# Patient Record
Sex: Male | Born: 1960
Health system: Southern US, Community
[De-identification: ages and names within clinical notes are randomized; demographics above are authoritative.]

## PROBLEM LIST (undated history)

## (undated) DIAGNOSIS — E039 Hypothyroidism, unspecified: Secondary | ICD-10-CM

## (undated) DIAGNOSIS — F319 Bipolar disorder, unspecified: Secondary | ICD-10-CM

## (undated) DIAGNOSIS — S0990XA Unspecified injury of head, initial encounter: Secondary | ICD-10-CM

## (undated) DIAGNOSIS — K219 Gastro-esophageal reflux disease without esophagitis: Secondary | ICD-10-CM

## (undated) DIAGNOSIS — M1990 Unspecified osteoarthritis, unspecified site: Secondary | ICD-10-CM

## (undated) DIAGNOSIS — F329 Major depressive disorder, single episode, unspecified: Secondary | ICD-10-CM

## (undated) DIAGNOSIS — E079 Disorder of thyroid, unspecified: Secondary | ICD-10-CM

## (undated) DIAGNOSIS — F32A Depression, unspecified: Secondary | ICD-10-CM

## (undated) DIAGNOSIS — Z8781 Personal history of (healed) traumatic fracture: Secondary | ICD-10-CM

## (undated) DIAGNOSIS — E119 Type 2 diabetes mellitus without complications: Secondary | ICD-10-CM

## (undated) DIAGNOSIS — Z9981 Dependence on supplemental oxygen: Secondary | ICD-10-CM

## (undated) DIAGNOSIS — F419 Anxiety disorder, unspecified: Secondary | ICD-10-CM

## (undated) DIAGNOSIS — Z9889 Other specified postprocedural states: Secondary | ICD-10-CM

## (undated) DIAGNOSIS — I8393 Asymptomatic varicose veins of bilateral lower extremities: Secondary | ICD-10-CM

## (undated) DIAGNOSIS — R112 Nausea with vomiting, unspecified: Secondary | ICD-10-CM

## (undated) DIAGNOSIS — J45909 Unspecified asthma, uncomplicated: Secondary | ICD-10-CM

## (undated) DIAGNOSIS — G629 Polyneuropathy, unspecified: Secondary | ICD-10-CM

## (undated) DIAGNOSIS — L0390 Cellulitis, unspecified: Secondary | ICD-10-CM

## (undated) DIAGNOSIS — I10 Essential (primary) hypertension: Secondary | ICD-10-CM

## (undated) DIAGNOSIS — J449 Chronic obstructive pulmonary disease, unspecified: Secondary | ICD-10-CM

## (undated) HISTORY — PX: SHOULDER SURGERY: SHX246

## (undated) HISTORY — PX: GANGLION CYST EXCISION: SHX1691

## (undated) HISTORY — DX: Bipolar disorder, unspecified: F31.9

## (undated) HISTORY — DX: Anxiety disorder, unspecified: F41.9

## (undated) HISTORY — PX: CHOLECYSTECTOMY: SHX55

## (undated) HISTORY — DX: Unspecified osteoarthritis, unspecified site: M19.90

## (undated) HISTORY — PX: UMBILICAL HERNIA REPAIR: SHX196

## (undated) HISTORY — DX: Chronic obstructive pulmonary disease, unspecified: J44.9

## (undated) HISTORY — DX: Depression, unspecified: F32.A

## (undated) HISTORY — DX: Gastro-esophageal reflux disease without esophagitis: K21.9

## (undated) HISTORY — DX: Unspecified asthma, uncomplicated: J45.909

## (undated) HISTORY — DX: Type 2 diabetes mellitus without complications: E11.9

## (undated) HISTORY — DX: Major depressive disorder, single episode, unspecified: F32.9

## (undated) HISTORY — DX: Disorder of thyroid, unspecified: E07.9

---

## 2017-05-24 ENCOUNTER — Ambulatory Visit: Payer: Self-pay | Admitting: Nurse Practitioner

## 2017-06-08 DIAGNOSIS — J45909 Unspecified asthma, uncomplicated: Secondary | ICD-10-CM | POA: Diagnosis not present

## 2017-06-08 DIAGNOSIS — I10 Essential (primary) hypertension: Secondary | ICD-10-CM | POA: Diagnosis not present

## 2017-06-08 DIAGNOSIS — J9611 Chronic respiratory failure with hypoxia: Secondary | ICD-10-CM | POA: Diagnosis not present

## 2017-06-14 ENCOUNTER — Ambulatory Visit: Payer: Self-pay | Admitting: Nurse Practitioner

## 2017-06-21 DIAGNOSIS — R42 Dizziness and giddiness: Secondary | ICD-10-CM | POA: Diagnosis not present

## 2017-06-30 ENCOUNTER — Ambulatory Visit (HOSPITAL_COMMUNITY): Payer: Self-pay | Admitting: Psychiatry

## 2017-07-05 ENCOUNTER — Encounter: Payer: Self-pay | Admitting: Nurse Practitioner

## 2017-07-05 ENCOUNTER — Other Ambulatory Visit: Payer: Self-pay | Admitting: Nurse Practitioner

## 2017-07-05 ENCOUNTER — Ambulatory Visit (INDEPENDENT_AMBULATORY_CARE_PROVIDER_SITE_OTHER): Payer: Medicare HMO | Admitting: Nurse Practitioner

## 2017-07-05 VITALS — BP 121/74 | HR 75 | Temp 99.1°F | Ht 75.0 in | Wt >= 6400 oz

## 2017-07-05 DIAGNOSIS — F319 Bipolar disorder, unspecified: Secondary | ICD-10-CM

## 2017-07-05 DIAGNOSIS — J9612 Chronic respiratory failure with hypercapnia: Secondary | ICD-10-CM | POA: Diagnosis not present

## 2017-07-05 DIAGNOSIS — M1990 Unspecified osteoarthritis, unspecified site: Secondary | ICD-10-CM | POA: Insufficient documentation

## 2017-07-05 DIAGNOSIS — E559 Vitamin D deficiency, unspecified: Secondary | ICD-10-CM

## 2017-07-05 DIAGNOSIS — J4520 Mild intermittent asthma, uncomplicated: Secondary | ICD-10-CM

## 2017-07-05 DIAGNOSIS — E119 Type 2 diabetes mellitus without complications: Secondary | ICD-10-CM | POA: Diagnosis not present

## 2017-07-05 DIAGNOSIS — E1143 Type 2 diabetes mellitus with diabetic autonomic (poly)neuropathy: Secondary | ICD-10-CM | POA: Insufficient documentation

## 2017-07-05 DIAGNOSIS — M7989 Other specified soft tissue disorders: Secondary | ICD-10-CM | POA: Diagnosis not present

## 2017-07-05 DIAGNOSIS — J45909 Unspecified asthma, uncomplicated: Secondary | ICD-10-CM | POA: Insufficient documentation

## 2017-07-05 DIAGNOSIS — M159 Polyosteoarthritis, unspecified: Secondary | ICD-10-CM

## 2017-07-05 DIAGNOSIS — M150 Primary generalized (osteo)arthritis: Secondary | ICD-10-CM

## 2017-07-05 DIAGNOSIS — J431 Panlobular emphysema: Secondary | ICD-10-CM

## 2017-07-05 DIAGNOSIS — M8949 Other hypertrophic osteoarthropathy, multiple sites: Secondary | ICD-10-CM

## 2017-07-05 DIAGNOSIS — K219 Gastro-esophageal reflux disease without esophagitis: Secondary | ICD-10-CM | POA: Insufficient documentation

## 2017-07-05 DIAGNOSIS — E039 Hypothyroidism, unspecified: Secondary | ICD-10-CM | POA: Diagnosis not present

## 2017-07-05 DIAGNOSIS — J449 Chronic obstructive pulmonary disease, unspecified: Secondary | ICD-10-CM | POA: Insufficient documentation

## 2017-07-05 MED ORDER — DICLOFENAC SODIUM 1 % TD GEL: 4.0000 g | Freq: Four times a day (QID) | TRANSDERMAL | 2 refills | Status: DC

## 2017-07-05 MED ORDER — LISINOPRIL 40 MG PO TABS: 40.0000 mg | ORAL_TABLET | Freq: Every day | ORAL | 2 refills | Status: DC

## 2017-07-05 MED ORDER — DICLOFENAC SODIUM 75 MG PO TBEC: 75.0000 mg | DELAYED_RELEASE_TABLET | Freq: Two times a day (BID) | ORAL | 2 refills | Status: DC

## 2017-07-05 MED ORDER — INSULIN GLARGINE 300 UNIT/ML ~~LOC~~ SOPN: 70.0000 [IU] | PEN_INJECTOR | Freq: Two times a day (BID) | SUBCUTANEOUS | 1 refills | Status: DC

## 2017-07-05 MED ORDER — LEVOTHYROXINE SODIUM 175 MCG PO TABS: 175.0000 ug | ORAL_TABLET | Freq: Every day | ORAL | 2 refills | Status: DC

## 2017-07-05 MED ORDER — BUPROPION HCL ER (SR) 100 MG PO TB12: 200.0000 mg | ORAL_TABLET | Freq: Two times a day (BID) | ORAL | 2 refills | Status: DC

## 2017-07-05 MED ORDER — GABAPENTIN 300 MG PO CAPS: 300.0000 mg | ORAL_CAPSULE | Freq: Three times a day (TID) | ORAL | 2 refills | Status: DC

## 2017-07-05 MED ORDER — BD PEN NEEDLE SHORT U/F 31G X 8 MM MISC: 1.0000 | Freq: Every day | 5 refills | Status: DC

## 2017-07-05 MED ORDER — NOVOLOG FLEXPEN 100 UNIT/ML ~~LOC~~ SOPN: PEN_INJECTOR | SUBCUTANEOUS | 0 refills | Status: DC

## 2017-07-05 MED ORDER — METFORMIN HCL ER 500 MG PO TB24: 1000.0000 mg | ORAL_TABLET | Freq: Two times a day (BID) | ORAL | 2 refills | Status: DC

## 2017-07-05 MED ORDER — ZIPRASIDONE HCL 20 MG PO CAPS: 20.0000 mg | ORAL_CAPSULE | Freq: Two times a day (BID) | ORAL | 1 refills | Status: DC

## 2017-07-05 MED ORDER — VICTOZA 18 MG/3ML ~~LOC~~ SOPN: 1.8000 mg | PEN_INJECTOR | Freq: Every day | SUBCUTANEOUS | 2 refills | Status: DC

## 2017-07-05 MED ORDER — FUROSEMIDE 40 MG PO TABS: 40.0000 mg | ORAL_TABLET | Freq: Every day | ORAL | 3 refills | Status: DC

## 2017-07-05 MED ORDER — CLONAZEPAM 1 MG PO TABS: 1.0000 mg | ORAL_TABLET | Freq: Three times a day (TID) | ORAL | 0 refills | Status: DC | PRN

## 2017-07-05 MED ORDER — OMEPRAZOLE 40 MG PO CPDR: 40.0000 mg | DELAYED_RELEASE_CAPSULE | Freq: Two times a day (BID) | ORAL | 5 refills | Status: DC

## 2017-07-05 MED ORDER — POTASSIUM CHLORIDE CRYS ER 20 MEQ PO TBCR: 20.0000 meq | EXTENDED_RELEASE_TABLET | Freq: Every day | ORAL | 1 refills | Status: DC

## 2017-07-05 NOTE — Patient Instructions (Addendum)
Markee, Thank you for coming in to clinic today.  1. Find a psychiatrist between now and our next visit: PSYCHIATRY / THERAPY-COUSEN[place]ING [facility] at [facility] - Referral required [address] [place], [place] [address] Phone: [phone]   Self Referral [facility] (All ages) [address] [place], [address] Phone: [phone] (Option 1) [email or web address]  [facility]) [place] [address] [place], [place] [address] Phone: [phone]  [employer], available walk-in 9am-4pm M-F [address] [place], [place] [address] Hours: 9am - 4pm (M-F, walk in available) [phone]  [facility]   Address: [address] [place], [place], [place] [address] Hours: 8AM-5PM (accepts walk in to establish) Phone: [phone]  [facility]   Address: [address] [place], [place], [place] [address] Hours: Tues 4:15PM-8PM // Weds 9am - 12pm // [place] 1pm - 8pm (Closed other days) Phone: [phone]   2. We will refer to pulmonology and endocrinology if needed.   Please schedule a follow-up appointment with [name], AGNP. Return in about 2 weeks (around 07/19/2017) for 3 chronic disease review.  If you have any other questions or concerns, please feel free to call the clinic or send a message through MyChart. You may also schedule an earlier appointment if necessary.  You will receive a survey after today's visit either digitally by e-mail or paper by [employer]. Your experiences and feedback matter to [place].  Please respond so we know how we are doing as we provide care for you.   [name], DNP, AGNP-BC Adult Gerontology Nurse Practitioner [facility], [address] [facility]

## 2017-07-05 NOTE — Progress Notes (Signed)
Subjective:    Patient ID: Philip [name], male    DOB: [birth year], 56 y.o.   MRN: [id number]  Philip [name] is a 56 y.o. male presenting on 07/05/2017 for Establish Care (right foot swelling x 2 weeks, great toe on left foot injured the toe nail bed x 2 mths)   HPI Establish Care New Provider Pt last seen by PCP Dr. Sheliah Mends and Dr. Zettie Cooley about 6 months ago.  Obtain records from [facility].  Dr. Ladona Ridgel [facility]  Foot swelling Pt reports worsening foot swelling, but is chronic problem.  Has not taken his furosemide in last several weeks.  Notes is only taking 2x per month r/t cramping and muscle fatigue. He is not taking potassium supplementation.  Bipolar - Worsened by financial problems. Occasional SI.  No plan.  Is well connected w/ psychiatry, but does need new referral locally. [facility] [place], [place]. Controlled Substance contract signed for clonazepam refill.  Current meds: Reviewed all medications, dosing, purpose w/ patient, including providing refills for many medications pt reports are at stable prior doses. Exception: Inhalers - patient takes albuterol, and spiriva/symbicort but does not have dosing or inhalers with him today.  Records do not show pulmonology note w/ dosing. Outpatient Encounter Prescriptions as of 07/05/2017  Medication Sig  . aspirin EC 81 MG tablet Take 81 mg by mouth daily.  . B-D ULTRAFINE III SHORT PEN 31G X 8 MM MISC Inject 1 Device into the skin 6 (six) times daily.  Marland Kitchen buPROPion (WELLBUTRIN SR) 100 MG 12 hr tablet Take 2 tablets (200 mg total) by mouth 2 (two) times daily.  . clonazePAM (KLONOPIN) 1 MG tablet Take 1 tablet (1 mg total) by mouth 3 (three) times daily as needed for anxiety.  . diclofenac (VOLTAREN) 75 MG EC tablet Take 1 tablet (75 mg total) by mouth 2 (two) times daily.  Marland Kitchen gabapentin (NEURONTIN) 300 MG capsule Take 1 capsule (300 mg total) by mouth 3 (three) times daily.    . Insulin Glargine (TOUJEO SOLOSTAR) 300 UNIT/ML SOPN Inject 70 Units into the skin 2 (two) times daily.  Marland Kitchen levothyroxine (SYNTHROID, LEVOTHROID) 175 MCG tablet Take 1 tablet (175 mcg total) by mouth daily before breakfast.  . lisinopril (PRINIVIL,ZESTRIL) 40 MG tablet Take 1 tablet (40 mg total) by mouth daily.  . metFORMIN (GLUCOPHAGE-XR) 500 MG 24 hr tablet Take 2 tablets (1,000 mg total) by mouth 2 (two) times daily with a meal.  . Multiple Vitamin (MULTIVITAMIN WITH MINERALS) TABS tablet Take 1 tablet by mouth daily.  . multivitamin-lutein (OCUVITE-LUTEIN) CAPS capsule Take 1 capsule by mouth daily.  Marland Kitchen NOVOLOG FLEXPEN 100 UNIT/ML FlexPen INJECT 20 UNITS UNDER THE SKIN TID BEFORE MEALS plus Sliding Scale correction up to 15 additional units per dose.  Marland Kitchen omeprazole (PRILOSEC) 40 MG capsule Take 1 capsule (40 mg total) by mouth 2 (two) times daily.  Marland Kitchen VICTOZA 18 MG/3ML SOPN Inject 0.3 mLs (1.8 mg total) into the skin daily.  . diclofenac sodium (VOLTAREN) 1 % GEL Apply 4 g topically 4 (four) times daily.  . furosemide (LASIX) 40 MG tablet Take 1 tablet (40 mg total) by mouth daily.  . potassium chloride SA (K-DUR,KLOR-CON) 20 MEQ tablet Take 1 tablet (20 mEq total) by mouth daily.  . ziprasidone (GEODON) 20 MG capsule Take 1 capsule (20 mg total) by mouth 2 (two) times daily with a meal.   No facility-administered encounter medications on file as of 07/05/2017.  Past Medical History:  Diagnosis Date  . Anxiety   . Asthma   . Bipolar 1 disorder (HCC)   . COPD (chronic obstructive pulmonary disease) (HCC)   . Depression   . Diabetes mellitus without complication (HCC)   . GERD (gastroesophageal reflux disease)   . Osteoarthritis   . Thyroid disease    Past Surgical History:  Procedure Laterality Date  . CHOLECYSTECTOMY    . SHOULDER SURGERY Right   . UMBILICAL HERNIA REPAIR     Social History   Social History  . Marital status: Single    Spouse name: N/A  . Number of  children: N/A  . Years of education: N/A   Occupational History  . Not on file.   Social History Main Topics  . Smoking status: Never Smoker  . Smokeless tobacco: Never Used  . Alcohol use Yes     Comment: occasionally  . Drug use: No  . Sexual activity: No   Other Topics Concern  . Not on file   Social History Narrative  . No narrative on file   Family History  Problem Relation Age of Onset  . Depression Mother   . Diabetes Mother   . Heart disease Mother   . Depression Father   . Lung cancer Father   . Diabetes Father   . Breast cancer Sister   . Anxiety disorder Sister   . Depression Sister   . Stroke Neg Hx    No current outpatient prescriptions on file prior to visit.   No current facility-administered medications on file prior to visit.     Review of Systems  Constitutional: Negative.   HENT: Negative.   Eyes: Negative.   Respiratory: Positive for shortness of breath.   Cardiovascular: Positive for leg swelling.  Gastrointestinal: Negative.   Endocrine: Negative.   Genitourinary: Negative.   Musculoskeletal: Positive for arthralgias and myalgias.  Skin: Negative.   Allergic/Immunologic: Positive for environmental allergies.  Neurological: Negative.   Hematological: Negative.   Psychiatric/Behavioral: Positive for dysphoric mood and suicidal ideas. The patient is nervous/anxious.    Per HPI unless specifically indicated above.     Objective:    BP 121/74 (BP Location: Right Arm, Patient Position: Sitting, Cuff Size: Large)   Pulse 75   Temp 99.1 F (37.3 C) (Oral)   Ht  (1.905 m)   Wt (!) 402 lb 8 oz (182.6 kg)   BMI 50.31 kg/m   Wt Readings from Last 3 Encounters:  07/05/17 (!) 402 lb 8 oz (182.6 kg)    Physical Exam  General - morbidly obese, well-appearing, NAD HEENT - Normocephalic, atraumatic Neck - supple, non-tender, no LAD, no thyromegaly, no carotid bruit Heart - RRR, no murmurs heard Lungs - Clear throughout all lobes, no  wheezing, crackles, or rhonchi. Normal work of breathing. Extremeties - non-tender, +1 edema, cap refill < 2 seconds, peripheral pulses intact +2 bilaterally, erythematous skin c/w arterial insufficiency noted Skin - warm, dry Neuro - awake, alert, oriented x3, antalgic/wide-stance gait Psych - Anxious mood and affect, normal behavior      Assessment & Plan:   Problem List Items Addressed This Visit      Respiratory   Asthma    Currently stable.  Pt unsure of inhaler doses.  Will bring to next appointment.      COPD (chronic obstructive pulmonary disease) (HCC)    Currently stable.  Pt unsure of inhaler doses.  Will bring to next appointment.  Chronic respiratory failure with hypercapnia (HCC)    Currently stable.  Pt unsure of inhaler doses.  Will bring to next appointment.        Digestive   GERD (gastroesophageal reflux disease)    Currently stable.  Reviewed high dose omeprazole, but pt wants to continue.   Plan: 1. Refill provided for omeprazole 40 mg bid. 2. Follow up at least q3 mos      Relevant Medications   omeprazole (PRILOSEC) 40 MG capsule     Endocrine   Diabetes mellitus without complication (HCC)    Pt notes needs to have detailed review of DM.  Review of medical history very detailed today.    Plan: 1. Continue meds at same doses.  Continue Toujeo, victoza, metformin, and novolog.  Lisinopril and asa, but not on statin.  2. Follow up 2-4 weeks for detailed review and medication titration.      Relevant Medications   Insulin Glargine (TOUJEO SOLOSTAR) 300 UNIT/ML SOPN   NOVOLOG FLEXPEN 100 UNIT/ML FlexPen   VICTOZA 18 MG/3ML SOPN   gabapentin (NEURONTIN) 300 MG capsule   lisinopril (PRINIVIL,ZESTRIL) 40 MG tablet   metFORMIN (GLUCOPHAGE-XR) 500 MG 24 hr tablet   B-D ULTRAFINE III SHORT PEN 31G X 8 MM MISC   aspirin EC 81 MG tablet   Other Relevant Orders   Lipid panel   Hypothyroid    No recent check of TSH.  Stable on current dose  levothyroxine.  Plan: 1. Check TSH,  Continue levothyroxine 175 mcg once daily for now.      Relevant Medications   levothyroxine (SYNTHROID, LEVOTHROID) 175 MCG tablet   Other Relevant Orders   Comprehensive metabolic panel   TSH     Musculoskeletal and Integument   Osteoarthritis    Pain currently well managed w/ topical and oral diclofenac. OA affects multiple joints.  Plan:  1. Treat with NSAIDs - diclofenac only.  Discussed alternate dosing w/ acetaminophen only. 2. Apply heat and/or ice to affected area. 3. May also apply topical lidocaine or topical diclofenac. 4. Follow up 3 months.       Relevant Medications   diclofenac (VOLTAREN) 75 MG EC tablet   diclofenac sodium (VOLTAREN) 1 % GEL   aspirin EC 81 MG tablet     Other   Bipolar 1 disorder (HCC)    Currently stable on medications as previously prescribed by psychiatry.  Pt needs refills and referral to local psychiatry.  Plan: 1. Provided patient self-referral information as [facility] is not accepting new adult patients. 2. Follow up as needed, but needs psychiatry for med management.      Relevant Medications   clonazePAM (KLONOPIN) 1 MG tablet   ziprasidone (GEODON) 20 MG capsule   furosemide (LASIX) 40 MG tablet   potassium chloride SA (K-DUR,KLOR-CON) 20 MEQ tablet   buPROPion (WELLBUTRIN SR) 100 MG 12 hr tablet    Other Visit Diagnoses    Vitamin D deficiency    -  Primary   Pt w/ hx vitamin D deficiency.  Plan: 1. Check Vitamin D and provide Rx supplementation prn.   Relevant Orders   Vitamin D (25 hydroxy)   Leg swelling       Leg swelling mild and likely 2/2 PVD.  Pt not taking furosemide as prescribed.  Plan: Resume furosemide 40 mg once daily w/ potassium 20 meq once daily. - CMP      Meds ordered this encounter  Medications  . DISCONTD: buPROPion (WELLBUTRIN SR) 100 MG  12 hr tablet    Sig: TK 2 TS PO BID.    Refill:  0  . DISCONTD: diclofenac (VOLTAREN) 75 MG EC tablet    Sig: TK 1 T  PO BID    Refill:  0  . DISCONTD: gabapentin (NEURONTIN) 300 MG capsule    Sig: TK 3 CS PO TID    Refill:  0  . DISCONTD: NOVOLOG FLEXPEN 100 UNIT/ML FlexPen    Sig: INJECT 20 UNITS UNDER THE SKIN TID BEFORE MEALS    Refill:  0  . DISCONTD: TOUJEO SOLOSTAR 300 UNIT/ML SOPN    Refill:  1  . DISCONTD: B-D ULTRAFINE III SHORT PEN 31G X 8 MM MISC    Sig: U SIX TIMES PER DAY AND PRN UTD    Refill:  4  . DISCONTD: levothyroxine (SYNTHROID, LEVOTHROID) 175 MCG tablet    Sig: TK 1 T PO D    Refill:  5  . DISCONTD: VICTOZA 18 MG/3ML SOPN    Sig: INJ 1.8 MG [place] D    Refill:  2  . DISCONTD: lisinopril (PRINIVIL,ZESTRIL) 40 MG tablet    Sig: TK 1 T PO QD    Refill:  1  . DISCONTD: metFORMIN (GLUCOPHAGE-XR) 500 MG 24 hr tablet    Sig: TK 2 TS PO BID    Refill:  0  . DISCONTD: omeprazole (PRILOSEC) 40 MG capsule    Sig: TK 1 C PO BID    Refill:  5  . DISCONTD: clonazePAM (KLONOPIN) 1 MG tablet    Sig: Take 1 mg by mouth 3 (three) times daily as needed for anxiety.  . clonazePAM (KLONOPIN) 1 MG tablet    Sig: Take 1 tablet (1 mg total) by mouth 3 (three) times daily as needed for anxiety.    Dispense:  90 tablet    Refill:  0  . ziprasidone (GEODON) 20 MG capsule    Sig: Take 1 capsule (20 mg total) by mouth 2 (two) times daily with a meal.    Dispense:  60 capsule    Refill:  1  . Insulin Glargine (TOUJEO SOLOSTAR) 300 UNIT/ML SOPN    Sig: Inject 70 Units into the skin 2 (two) times daily.    Dispense:  10 pen    Refill:  1    Please ensure is 30-day supply  . NOVOLOG FLEXPEN 100 UNIT/ML FlexPen    Sig: INJECT 20 UNITS UNDER THE SKIN TID BEFORE MEALS plus Sliding Scale correction up to 15 additional units per dose.    Dispense:  15 mL    Refill:  0  . VICTOZA 18 MG/3ML SOPN    Sig: Inject 0.3 mLs (1.8 mg total) into the skin daily.    Dispense:  3 pen    Refill:  2  . gabapentin (NEURONTIN) 300 MG capsule    Sig: Take 1 capsule (300 mg total) by mouth 3 (three) times daily.     Dispense:  90 capsule    Refill:  2  . lisinopril (PRINIVIL,ZESTRIL) 40 MG tablet    Sig: Take 1 tablet (40 mg total) by mouth daily.    Dispense:  30 tablet    Refill:  2  . omeprazole (PRILOSEC) 40 MG capsule    Sig: Take 1 capsule (40 mg total) by mouth 2 (two) times daily.    Dispense:  60 capsule    Refill:  5  . diclofenac (VOLTAREN) 75 MG EC tablet    Sig: Take 1  tablet (75 mg total) by mouth 2 (two) times daily.    Dispense:  60 tablet    Refill:  2  . metFORMIN (GLUCOPHAGE-XR) 500 MG 24 hr tablet    Sig: Take 2 tablets (1,000 mg total) by mouth 2 (two) times daily with a meal.    Dispense:  120 tablet    Refill:  2  . furosemide (LASIX) 40 MG tablet    Sig: Take 1 tablet (40 mg total) by mouth daily.    Dispense:  30 tablet    Refill:  3  . potassium chloride SA (K-DUR,KLOR-CON) 20 MEQ tablet    Sig: Take 1 tablet (20 mEq total) by mouth daily.    Dispense:  90 tablet    Refill:  1  . levothyroxine (SYNTHROID, LEVOTHROID) 175 MCG tablet    Sig: Take 1 tablet (175 mcg total) by mouth daily before breakfast.    Dispense:  30 tablet    Refill:  2  . buPROPion (WELLBUTRIN SR) 100 MG 12 hr tablet    Sig: Take 2 tablets (200 mg total) by mouth 2 (two) times daily.    Dispense:  120 tablet    Refill:  2  . diclofenac sodium (VOLTAREN) 1 % GEL    Sig: Apply 4 g topically 4 (four) times daily.    Dispense:  1 Tube    Refill:  2  . B-D ULTRAFINE III SHORT PEN 31G X 8 MM MISC    Sig: Inject 1 Device into the skin 6 (six) times daily.    Dispense:  360 each    Refill:  5  . multivitamin-lutein (OCUVITE-LUTEIN) CAPS capsule    Sig: Take 1 capsule by mouth daily.  . Multiple Vitamin (MULTIVITAMIN WITH MINERALS) TABS tablet    Sig: Take 1 tablet by mouth daily.  Marland Kitchen aspirin EC 81 MG tablet    Sig: Take 81 mg by mouth daily.      Follow up plan: Return in about 2 weeks (around 07/19/2017) for 3 chronic disease review.  [name], DNP, AGPCNP-BC Adult Gerontology  Primary Care Nurse Practitioner [facility] 07/09/2017, 9:05 PM

## 2017-07-08 ENCOUNTER — Other Ambulatory Visit: Payer: Self-pay

## 2017-07-09 DIAGNOSIS — J45909 Unspecified asthma, uncomplicated: Secondary | ICD-10-CM | POA: Diagnosis not present

## 2017-07-09 DIAGNOSIS — J9612 Chronic respiratory failure with hypercapnia: Secondary | ICD-10-CM | POA: Insufficient documentation

## 2017-07-09 DIAGNOSIS — J9611 Chronic respiratory failure with hypoxia: Secondary | ICD-10-CM | POA: Diagnosis not present

## 2017-07-09 DIAGNOSIS — I10 Essential (primary) hypertension: Secondary | ICD-10-CM | POA: Diagnosis not present

## 2017-07-09 NOTE — Assessment & Plan Note (Signed)
Currently stable.  Pt unsure of inhaler doses.  Will bring to next appointment. 

## 2017-07-09 NOTE — Assessment & Plan Note (Signed)
Currently stable.  Pt unsure of inhaler doses.  Will bring to next appointment.

## 2017-07-09 NOTE — Assessment & Plan Note (Signed)
Currently stable.  Reviewed high dose omeprazole, but pt wants to continue.   Plan: 1. Refill provided for omeprazole 40 mg bid. 2. Follow up at least q3 mos

## 2017-07-09 NOTE — Assessment & Plan Note (Signed)
No recent check of TSH.  Stable on current dose levothyroxine.  Plan: 1. Check TSH,  Continue levothyroxine 175 mcg once daily for now.

## 2017-07-09 NOTE — Assessment & Plan Note (Signed)
Pt notes needs to have detailed review of DM.  Review of medical history very detailed today.    Plan: 1. Continue meds at same doses.  Continue Toujeo, victoza, metformin, and novolog.  Lisinopril and asa, but not on statin.  2. Follow up 2-4 weeks for detailed review and medication titration.

## 2017-07-09 NOTE — Assessment & Plan Note (Signed)
Pain currently well managed w/ topical and oral diclofenac. OA affects multiple joints.  Plan:  1. Treat with NSAIDs - diclofenac only.  Discussed alternate dosing w/ acetaminophen only. 2. Apply heat and/or ice to affected area. 3. May also apply topical lidocaine or topical diclofenac. 4. Follow up 3 months.

## 2017-07-09 NOTE — Assessment & Plan Note (Signed)
Currently stable on medications as previously prescribed by psychiatry.  Pt needs refills and referral to local psychiatry.  Plan: 1. Provided patient self-referral information as [facility] is not accepting new adult patients. 2. Follow up as needed, but needs psychiatry for med management.

## 2017-07-13 ENCOUNTER — Other Ambulatory Visit: Payer: Self-pay

## 2017-07-13 DIAGNOSIS — F319 Bipolar disorder, unspecified: Secondary | ICD-10-CM

## 2017-07-14 ENCOUNTER — Other Ambulatory Visit: Payer: Self-pay

## 2017-07-18 ENCOUNTER — Telehealth: Payer: Self-pay | Admitting: Nurse Practitioner

## 2017-07-18 NOTE — Telephone Encounter (Signed)
Called pt to sched for Annual Wellness Visit with Nurse Health Advisor. C/b #  [phone]  Kathryn Brown ° °

## 2017-07-19 ENCOUNTER — Other Ambulatory Visit: Payer: Self-pay | Admitting: Nurse Practitioner

## 2017-07-19 ENCOUNTER — Ambulatory Visit: Payer: Self-pay | Admitting: Nurse Practitioner

## 2017-07-19 DIAGNOSIS — M150 Primary generalized (osteo)arthritis: Principal | ICD-10-CM

## 2017-07-19 DIAGNOSIS — M159 Polyosteoarthritis, unspecified: Secondary | ICD-10-CM

## 2017-07-19 DIAGNOSIS — M8949 Other hypertrophic osteoarthropathy, multiple sites: Secondary | ICD-10-CM

## 2017-07-28 ENCOUNTER — Ambulatory Visit: Payer: Self-pay | Admitting: Nurse Practitioner

## 2017-07-29 ENCOUNTER — Other Ambulatory Visit: Payer: Self-pay | Admitting: Nurse Practitioner

## 2017-07-29 DIAGNOSIS — E119 Type 2 diabetes mellitus without complications: Secondary | ICD-10-CM

## 2017-07-29 MED ORDER — NOVOLOG FLEXPEN 100 UNIT/ML ~~LOC~~ SOPN: PEN_INJECTOR | SUBCUTANEOUS | 6 refills | Status: DC

## 2017-08-08 DIAGNOSIS — J9611 Chronic respiratory failure with hypoxia: Secondary | ICD-10-CM | POA: Diagnosis not present

## 2017-08-08 DIAGNOSIS — J45909 Unspecified asthma, uncomplicated: Secondary | ICD-10-CM | POA: Diagnosis not present

## 2017-08-08 DIAGNOSIS — I10 Essential (primary) hypertension: Secondary | ICD-10-CM | POA: Diagnosis not present

## 2017-08-09 ENCOUNTER — Telehealth: Payer: Self-pay | Admitting: Nurse Practitioner

## 2017-08-09 NOTE — Telephone Encounter (Signed)
Called pt to sched for AWV with Nurse Health Advisor.  C/b #  [phone]  Kathryn Brown  

## 2017-09-03 ENCOUNTER — Other Ambulatory Visit: Payer: Self-pay | Admitting: Nurse Practitioner

## 2017-09-03 DIAGNOSIS — F319 Bipolar disorder, unspecified: Secondary | ICD-10-CM

## 2017-09-05 NOTE — Telephone Encounter (Signed)
Pt must continue regular visits to continue this medication.  Also needs to establish psychiatry to continue treatment for his bipolar disorder.

## 2017-09-08 DIAGNOSIS — J45909 Unspecified asthma, uncomplicated: Secondary | ICD-10-CM | POA: Diagnosis not present

## 2017-09-08 DIAGNOSIS — J9611 Chronic respiratory failure with hypoxia: Secondary | ICD-10-CM | POA: Diagnosis not present

## 2017-09-08 DIAGNOSIS — I10 Essential (primary) hypertension: Secondary | ICD-10-CM | POA: Diagnosis not present

## 2017-09-09 ENCOUNTER — Encounter: Payer: Self-pay | Admitting: Nurse Practitioner

## 2017-09-09 ENCOUNTER — Ambulatory Visit (INDEPENDENT_AMBULATORY_CARE_PROVIDER_SITE_OTHER): Payer: Medicare HMO | Admitting: Nurse Practitioner

## 2017-09-09 ENCOUNTER — Other Ambulatory Visit: Payer: Self-pay

## 2017-09-09 VITALS — BP 121/72 | HR 66 | Temp 98.1°F | Ht 75.0 in | Wt >= 6400 oz

## 2017-09-09 DIAGNOSIS — K5909 Other constipation: Secondary | ICD-10-CM | POA: Diagnosis not present

## 2017-09-09 DIAGNOSIS — E559 Vitamin D deficiency, unspecified: Secondary | ICD-10-CM | POA: Diagnosis not present

## 2017-09-09 DIAGNOSIS — E119 Type 2 diabetes mellitus without complications: Secondary | ICD-10-CM

## 2017-09-09 DIAGNOSIS — E039 Hypothyroidism, unspecified: Secondary | ICD-10-CM | POA: Diagnosis not present

## 2017-09-09 DIAGNOSIS — F319 Bipolar disorder, unspecified: Secondary | ICD-10-CM

## 2017-09-09 LAB — POCT GLYCOSYLATED HEMOGLOBIN (HGB A1C): Hemoglobin A1C: 9.1

## 2017-09-09 MED ORDER — DOCUSATE SODIUM 100 MG PO CAPS: 100.0000 mg | ORAL_CAPSULE | Freq: Two times a day (BID) | ORAL | 1 refills | Status: DC | PRN

## 2017-09-09 MED ORDER — CLONAZEPAM 1 MG PO TABS: 1.0000 mg | ORAL_TABLET | Freq: Three times a day (TID) | ORAL | 0 refills | Status: DC | PRN

## 2017-09-09 NOTE — Assessment & Plan Note (Signed)
Currently stable on medications as previously prescribed by psychiatry and [place].  Patient has not yet established psychiatry here in [place].  He had a death in his family around the time he had that appointment established.  He has run out of clonazepam in the last 3 days and requests a refill today.  Plan: 1.  Encourage patient to reach back out to [facility] to reestablish psychiatric care. 2.  Provided 130-day supply of clonazepam.  Discussed with patient the importance of reestablishing with psychiatry.  Patient verbalizes understanding. 3.  Follow-up as needed.

## 2017-09-09 NOTE — Assessment & Plan Note (Signed)
Currently uncontrolled with A1c 9.1 today in clinic.  Patient reports hypoglycemic event greater than 400 at home one night.  Patient is on very high dose Toujeo 70 units twice daily and only NovoLog 20 units plus sliding scale insulin for meals.  This creates significant imbalance between basal and bolus insulin ratio.  Suspect possible reactive hyperglycemia.  Patient is also eating a regular meals without more than 2 meals per day.  Patient reports generally good morning fasting blood sugars but does not have his log or his meter with him today.  He is also taking metformin and Victoza without side effects.  Plan: 1.  Decrease Toujeo to 60 units twice daily.  If morning CBG greater than 150 after dose reduction, may increase both doses daily by 2 units every 3 days to max of 70 units daily. 2.  Continue Lantus 20 units with meals plus sliding scale insulin.  If at mealtime and checking glucose without a meal and blood sugar is higher than 150, administer sliding scale insulin only.  Patient verbalizes understanding 3.  Continue Victoza and metformin without changes. 4.  Reviewed low glycemic diet.  Encouraged regular mealtimes.  If no full meal at least eat a small snack. 5.  Encouraged annual eye exam.  Patient verbalizes will likely have it completed in January. 6.  Follow-up in 4 weeks with blood sugar log.

## 2017-09-09 NOTE — Patient Instructions (Addendum)
Schylar, Thank you for coming in to clinic today.  1. For your diabetes: - You are now taking too much toujeo. REDUCE to 60 mg twice daily for your toujeo. - Call clinic if morning blood sugars are regularly over 130.  - Continue your mealtime insulin without changes.  Take your sliding scale only if you do not eat if your blood sugar is over 150.    Keep a log and bring to clinic. Your provider would like to you have your annual eye exam. Please contact your current eye doctor or here are some good options for you to contact.   [facility]        [facility] Address: [address], [place], [place] [address]   Address: [address], [place], [place] [address]  Phone: [phone]      Phone: [phone]  Website: [email or web address]    Website: [email or web address]     [facility]       [facility]  Address: [address], [place], [place] [address]   Address: [address], [place], [place] [address] Phone: [phone]      Phone: [phone]    [facility] Address: [address], [place], [place] [address]  Phone: [phone]   2. Bipolar: - Refill one month of clonazepam.  You should be able to get [facility] to take over coverage for all your Bipolar meds.  Please schedule a follow-up appointment with Wilhelmina McardleLauren Arlynn Mcdermid, AGNP. Return in about 4 weeks (around 10/07/2017) for diabetes.  If you have any other questions or concerns, please feel free to call the clinic or send a message through MyChart. You may also schedule an earlier appointment if necessary.  You will receive a survey after today's visit either digitally by e-mail or paper by [employer] mail. Your experiences and feedback matter to us.  Please respond so we know how we are doing as we provide care for you.   Wilhelmina McardleLauren Khloey Chern, DNP, AGNP-BC Adult Gerontology Nurse Practitioner [facility] [place] [facility], [facility]

## 2017-09-09 NOTE — Progress Notes (Signed)
Subjective:    Patient ID: Philip [name], male    DOB: 1960/12/08, 56 y.o.   MRN: [id number]  Philip ButteRichard Novosel [name] is a 56 y.o. male presenting on 09/09/2017 for Diabetes   HPI Diabetes Pt presents today for follow up of Type 2 diabetes mellitus. He is checking fasting am CBG at home with a range of around 100-150, but have been high > 400 once.  Felt real tired and checked CBG.  Was very high, then batteries went dead. - Current diabetic medications include: metformin, victoza, toujeo 70 mg twice daily, novolog 20 units w/ meals plus SSI. - He is not currently symptomatic.  - He denies polydipsia, polyphagia, polyuria, headaches, diaphoresis, shakiness, chills, pain, numbness or tingling in extremities and changes in vision.   - Clinical course has been unchanged. - He  reports no regular exercise routine. - His diet is high in salt, high in fat, and moderate in carbohydrates. - Weight trend: stable  PREVENTION:  Eye exam current (within one year): no - Plan for January space with near insurance. Foot exam current (within one year): no Lipid/ASCVD risk reduction - on statin: not currently - avoided statins related to muscle twitches and cramps.  He has taken fish oil in the past but has not tried ezetimibe.  He is not currently on any medication for cholesterol. Kidney protection - on ace or arb: lisinopril  Recent Labs    09/09/17 0938  HGBA1C 9.1    Bipolar Disorder Holidays are much harder.  Misses his mom and still has frequent worry, stressful events are very disruptive. Has not been able to sleep since running out of clonazepam. Has not had withdrawal symptoms.  Has not taken for 6-7 days.  GERD Patient reports good control of acid reflux with omeprazole 40 mg twice daily.  He does have intermittent breakthrough heartburn but only with acidic foods.  He ate chili and had reflux after tomatoes.  Otherwise he notes no problems with his acid reflux.  He has been eating  small meals in an effort to try to lose weight.    Pt reports constipation w/o BM in 3 days.  He is already taking metamucil supplement, but states he is not drinking much water.  Social History   Tobacco Use  . Smoking status: Never Smoker  . Smokeless tobacco: Never Used  Substance Use Topics  . Alcohol use: Yes    Comment: occasionally  . Drug use: No    Review of Systems Per HPI unless specifically indicated above     Objective:    BP 121/72 (BP Location: Right Arm, Patient Position: Sitting, Cuff Size: Large)   Pulse 66   Temp 98.1 F (36.7 C) (Oral)   Ht 6\' 3"  (1.905 m)   Wt (!) 404 lb 8 oz (183.5 kg)   BMI 50.56 kg/m   Wt Readings from Last 3 Encounters:  09/09/17 (!) 404 lb 8 oz (183.5 kg)  07/05/17 (!) 402 lb 8 oz (182.6 kg)    Physical Exam  General - morbidly obese, well-appearing, NAD HEENT - Normocephalic, atraumatic Neck - supple, non-tender, no LAD, no thyromegaly, no carotid bruit Heart - RRR, no murmurs heard Lungs - Clear throughout all lobes, no wheezing, crackles, or rhonchi. Normal work of breathing. Extremeties - non-tender, no edema, cap refill < 2 seconds, peripheral pulses intact +2 bilaterally Skin - warm, dry Neuro - awake, alert, oriented x3, normal gait Psych - Normal mood and affect, normal behavior  Diabetic Foot Form - Detailed   Diabetic Foot Exam - detailed Diabetic Foot exam was performed with the following findings:  Yes 09/09/2017  9:50 AM  Visual Foot Exam completed.:  Yes  Can the patient see the bottom of their feet?:  No Are the shoes appropriate in style and fit?:  Yes Is there swelling or and abnormal foot shape?:  No Is there a claw toe deformity?:  No Is there elevated skin temparature?:  No Is there foot or ankle muscle weakness?:  No Normal Range of Motion:  Yes Pulse Foot Exam completed.:  Yes  Sensory Foot Exam Completed.:  Yes Semmes-Weinstein Monofilament Test      Results for orders placed or performed  in visit on 09/09/17  POCT glycosylated hemoglobin (Hb A1C)  Result Value Ref Range   Hemoglobin A1C 6.1       Assessment & Plan:   Problem List Items Addressed This Visit      Endocrine   Diabetes mellitus without complication (HCC) - Primary    Currently uncontrolled with A1c 9.1 today in clinic.  Patient reports hypoglycemic event greater than 400 at home one night.  Patient is on very high dose Toujeo 70 units twice daily and only NovoLog 20 units plus sliding scale insulin for meals.  This creates significant imbalance between basal and bolus insulin ratio.  Suspect possible reactive hyperglycemia.  Patient is also eating a regular meals without more than 2 meals per day.  Patient reports generally good morning fasting blood sugars but does not have his log or his meter with him today.  He is also taking metformin and Victoza without side effects.  Plan: 1.  Decrease Toujeo to 60 units twice daily.  If morning CBG greater than 150 after dose reduction, may increase both doses daily by 2 units every 3 days to max of 70 units daily. 2.  Continue Lantus 20 units with meals plus sliding scale insulin.  If at mealtime and checking glucose without a meal and blood sugar is higher than 150, administer sliding scale insulin only.  Patient verbalizes understanding 3.  Continue Victoza and metformin without changes. 4.  Reviewed low glycemic diet.  Encouraged regular mealtimes.  If no full meal at least eat a small snack. 5.  Encouraged annual eye exam.  Patient verbalizes will likely have it completed in January. 6.  Follow-up in 4 weeks with blood sugar log.      Relevant Orders   POCT glycosylated hemoglobin (Hb A1C) (Completed)     Other   Bipolar 1 disorder (HCC)    Currently stable on medications as previously prescribed by psychiatry and [facility].  Patient has not yet established psychiatry here in [place].  He had a death in his family around the time he had that appointment established.   He has run out of clonazepam in the last 3 days and requests a refill today.  Plan: 1.  Encourage patient to reach back out to [facility] to reestablish psychiatric care. 2.  Provided 130-day supply of clonazepam.  Discussed with patient the importance of reestablishing with psychiatry.  Patient verbalizes understanding. 3.  Follow-up as needed.      Relevant Medications   clonazePAM (KLONOPIN) 1 MG tablet    Other Visit Diagnoses    Other constipation       Constipation from multifactorial causes.  Continue metamucil.  Drink plenty of water.  Start colace 100 mg bid as needed if no BM in 3  days.   Relevant Medications   docusate sodium (COLACE) 100 MG capsule      Meds ordered this encounter  Medications  . clonazePAM (KLONOPIN) 1 MG tablet    Sig: Take 1 tablet (1 mg total) by mouth 3 (three) times daily as needed for anxiety.    Dispense:  90 tablet    Refill:  0    Order Specific Question:   Supervising Provider    Answer:   Smitty CordsKARAMALEGOS, ALEXANDER J [[id number]]  . docusate sodium (COLACE) 100 MG capsule    Sig: Take 1 capsule (100 mg total) by mouth 2 (two) times daily as needed for mild constipation (if no bowel movement in 3 days).    Dispense:  30 capsule    Refill:  1    Order Specific Question:   Supervising Provider    Answer:   Smitty CordsKARAMALEGOS, ALEXANDER J [[id number]]   Cholesterol: consider fish oil and/or ezetimibe  Follow up plan: Return in about 4 weeks (around 10/07/2017) for diabetes.  A total of 40 minutes was spent face-to-face with this patient. Greater than 50% of this time was spent in counseling and coordination of care with the patient.   Wilhelmina McardleLauren Elenie Coven, DNP, AGPCNP-BC Adult Gerontology Primary Care Nurse Practitioner [facility] 09/09/2017, 11:20 PM

## 2017-09-10 LAB — COMPREHENSIVE METABOLIC PANEL
AG Ratio: 1.6 (calc) (ref 1.0–2.5)
ALT: 68 U/L — ABNORMAL HIGH (ref 9–46)
AST: 42 U/L — ABNORMAL HIGH (ref 10–35)
Albumin: 4.1 g/dL (ref 3.6–5.1)
Alkaline phosphatase (APISO): 58 U/L (ref 40–115)
BUN: 18 mg/dL (ref 7–25)
CO2: 30 mmol/L (ref 20–32)
Calcium: 9.8 mg/dL (ref 8.6–10.3)
Chloride: 102 mmol/L (ref 98–110)
Creat: 1.24 mg/dL (ref 0.70–1.33)
Globulin: 2.5 g/dL (calc) (ref 1.9–3.7)
Glucose, Bld: 99 mg/dL (ref 65–99)
Potassium: 4.4 mmol/L (ref 3.5–5.3)
Sodium: 140 mmol/L (ref 135–146)
Total Bilirubin: 0.8 mg/dL (ref 0.2–1.2)
Total Protein: 6.6 g/dL (ref 6.1–8.1)

## 2017-09-10 LAB — LIPID PANEL
Cholesterol: 175 mg/dL (ref <200)
HDL: 33 mg/dL — ABNORMAL LOW (ref >40)
LDL Cholesterol (Calc): 115 mg/dL (calc) — ABNORMAL HIGH
Non-HDL Cholesterol (Calc): 142 mg/dL (calc) — ABNORMAL HIGH (ref <130)
Total CHOL/HDL Ratio: 5.3 (calc) — ABNORMAL HIGH (ref <5.0)
Triglycerides: 153 mg/dL — ABNORMAL HIGH (ref <150)

## 2017-09-10 LAB — VITAMIN D 25 HYDROXY (VIT D DEFICIENCY, FRACTURES): Vit D, 25-Hydroxy: 22 ng/mL — ABNORMAL LOW (ref 30–100)

## 2017-09-10 LAB — TSH: TSH: 6.69 mIU/L — ABNORMAL HIGH (ref 0.40–4.50)

## 2017-09-13 ENCOUNTER — Other Ambulatory Visit: Payer: Self-pay | Admitting: Nurse Practitioner

## 2017-09-13 ENCOUNTER — Telehealth: Payer: Self-pay

## 2017-09-13 DIAGNOSIS — E039 Hypothyroidism, unspecified: Secondary | ICD-10-CM

## 2017-09-13 DIAGNOSIS — E559 Vitamin D deficiency, unspecified: Secondary | ICD-10-CM

## 2017-09-13 DIAGNOSIS — E119 Type 2 diabetes mellitus without complications: Secondary | ICD-10-CM

## 2017-09-13 DIAGNOSIS — E785 Hyperlipidemia, unspecified: Secondary | ICD-10-CM

## 2017-09-13 DIAGNOSIS — E1169 Type 2 diabetes mellitus with other specified complication: Secondary | ICD-10-CM

## 2017-09-13 MED ORDER — ATORVASTATIN CALCIUM 10 MG PO TABS: 10.0000 mg | ORAL_TABLET | Freq: Every day | ORAL | 5 refills | Status: DC

## 2017-09-13 MED ORDER — VITAMIN D (ERGOCALCIFEROL) 1.25 MG (50000 UNIT) PO CAPS: 50000.0000 [IU] | ORAL_CAPSULE | ORAL | 0 refills | Status: DC

## 2017-09-13 MED ORDER — LEVOTHYROXINE SODIUM 200 MCG PO TABS: 200.0000 ug | ORAL_TABLET | Freq: Every day | ORAL | 5 refills | Status: DC

## 2017-09-13 NOTE — Telephone Encounter (Signed)
-----   Message from Galen ManilaLauren Renee Kennedy, NP sent at 09/13/2017  9:05 AM EST ----- Your vitamin D level is lower than normal, but better than your last labs.  Continue another 12 weeks of ergocalciferol 50,000 IU once weekly.  Then, START taking 2,000 IU vitamin D3 over the counter once daily.   CMP: Your CMP is normal except elevated liver enzymes.  We will continue to monitor these over time since they are not significantly elevated.  Normal fasting glucose, electrolytes, and kidney function.  TSH is too high, which means there is too little levothyroxine.  Increase from 175 to 200 mcg. Take levothyroxine 200 mcg 1 (one) tablet each morning before breakfast.  Wait at least 30 minutes to 1 hour before eating or drinking anything except water.  Please schedule a lab only visit in 6 weeks to recheck your TSH to make sure you are taking the right dose of levothyroxine.  It can take 6 weeks to see a change in TSH after changing the levothyroxine dose.  LIPID PANEL: - In someone who has diabetes, cholesterol is often elevated.  Your total cholesterol is normal, but all of your other values are abnormal  High: triglycerides, LDL Low: HDL - LDL is your bad cholesterol that can build up in your arteries and veins and lead to heart attack and stroke over time.  This can be managed with medication and diet changes.   - HDL is your good cholesterol.  When it is higher than 50, it can be protective against and prevent cholesterol buildup in your arteries that could lead to heart attack or stroke.   - You can lower your LDL and raise your HDL by exercising, by taking fish oil or by increasing your dietary omega-3 fatty acids.  Omega-3s are found in olive oil, fish, and seeds and nuts like almonds, walnuts, and pecans.  Avoid saturated fats found in meat and dairy products.  Polyunsaturated fats and monounsaturated fats are better choices, but still limit your overall intake of these.  Begin eating more whole grain  carbohydrates with more dietary fiber instead of "white" carbohydrates like potatoes, rice, and white bread.   START atorvastatin 10 mg once daily at your evening meal.  You had muscle aches with simvastatin, but I want you to try another medication.  We can dose it every other day if you need us to.

## 2017-09-13 NOTE — Telephone Encounter (Signed)
The pt was notified of his lab results. He wanted to know if you could send in Depo-Testosterone to his pharmacy. He states that the testosterone gel is making him break out in a rash. He also stated that he cannot take any statin medications, because it makes his muscle twitch. Please advise

## 2017-09-13 NOTE — Telephone Encounter (Signed)
Instructed pt to take atorvastatin 10 mg once daily to document intolerance to statin therapy.  Simvastatin has already been documented as intolerance. - Will consider livalo vs ezetimibe if atorvastatin has been failed.    Pt will need urology referral to discuss depo- testosterone.  Will place referral.

## 2017-09-22 ENCOUNTER — Ambulatory Visit (INDEPENDENT_AMBULATORY_CARE_PROVIDER_SITE_OTHER): Payer: Medicare HMO

## 2017-09-22 DIAGNOSIS — Z23 Encounter for immunization: Secondary | ICD-10-CM | POA: Diagnosis not present

## 2017-09-30 ENCOUNTER — Other Ambulatory Visit: Payer: Self-pay | Admitting: Nurse Practitioner

## 2017-09-30 DIAGNOSIS — E119 Type 2 diabetes mellitus without complications: Secondary | ICD-10-CM

## 2017-09-30 NOTE — Progress Notes (Signed)
Pt answered when calling for results for his spouse and spoke with me about uncontrolled glucose with his recent move.  Has no batteries for his meter (accucheck glide). - Is interested in going to diabetes education with his spouse, Heron Sabinsric Cline.  - Financial limitations are preventing purchase of batteries.  Using food banks currently for food source.  Provided medication management clinic information for possible assistance financially for diabetes testing supplies.

## 2017-10-05 ENCOUNTER — Telehealth: Payer: Self-pay | Admitting: Nurse Practitioner

## 2017-10-05 NOTE — Telephone Encounter (Signed)
Called pt to sched for AWV with Nurse Health Advisor. please schedule AWV with [facility] any date . C/b #  [phone] on Skype [email or web address] if you have questions

## 2017-10-08 DIAGNOSIS — J45909 Unspecified asthma, uncomplicated: Secondary | ICD-10-CM | POA: Diagnosis not present

## 2017-10-08 DIAGNOSIS — J9611 Chronic respiratory failure with hypoxia: Secondary | ICD-10-CM | POA: Diagnosis not present

## 2017-10-08 DIAGNOSIS — I10 Essential (primary) hypertension: Secondary | ICD-10-CM | POA: Diagnosis not present

## 2017-10-11 ENCOUNTER — Other Ambulatory Visit: Payer: Self-pay | Admitting: Nurse Practitioner

## 2017-10-11 DIAGNOSIS — Z598 Other problems related to housing and economic circumstances: Secondary | ICD-10-CM

## 2017-10-11 DIAGNOSIS — F319 Bipolar disorder, unspecified: Secondary | ICD-10-CM

## 2017-10-11 DIAGNOSIS — Z748 Other problems related to care provider dependency: Secondary | ICD-10-CM

## 2017-10-11 DIAGNOSIS — Z599 Problem related to housing and economic circumstances, unspecified: Secondary | ICD-10-CM

## 2017-10-12 ENCOUNTER — Ambulatory Visit: Payer: Medicare HMO | Admitting: Nurse Practitioner

## 2017-10-12 NOTE — Telephone Encounter (Signed)
The pt cancelled and rescheduled his appointment for today.

## 2017-10-12 NOTE — Telephone Encounter (Signed)
Patient must attend appointment with me on 10/18/17 as scheduled.  Provided 7 day supply to bridge gap between today and next appointment.  Pt had previously updated me on phone that he was still unable to get to [facility] r/t transportation and financial difficulties to pay for gas.  Will need referral to connected care as well.

## 2017-10-17 ENCOUNTER — Telehealth: Payer: Self-pay | Admitting: Nurse Practitioner

## 2017-10-17 DIAGNOSIS — F3132 Bipolar disorder, current episode depressed, moderate: Secondary | ICD-10-CM | POA: Diagnosis not present

## 2017-10-17 NOTE — Telephone Encounter (Signed)
Spoke w/ [name] regarding his financial needs. See referral notes for details

## 2017-10-18 ENCOUNTER — Ambulatory Visit: Payer: Medicare HMO | Admitting: Nurse Practitioner

## 2017-10-18 ENCOUNTER — Ambulatory Visit: Payer: Medicare HMO

## 2017-10-21 ENCOUNTER — Ambulatory Visit: Payer: Self-pay | Admitting: *Deleted

## 2017-10-26 DIAGNOSIS — H31011 Macula scars of posterior pole (postinflammatory) (post-traumatic), right eye: Secondary | ICD-10-CM | POA: Diagnosis not present

## 2017-10-26 DIAGNOSIS — E119 Type 2 diabetes mellitus without complications: Secondary | ICD-10-CM | POA: Diagnosis not present

## 2017-10-26 LAB — HM DIABETES EYE EXAM

## 2017-10-31 ENCOUNTER — Encounter: Payer: Medicare HMO | Attending: Nurse Practitioner | Admitting: *Deleted

## 2017-10-31 ENCOUNTER — Encounter: Payer: Self-pay | Admitting: *Deleted

## 2017-10-31 VITALS — BP 122/78 | Ht 75.0 in | Wt >= 6400 oz

## 2017-10-31 DIAGNOSIS — Z713 Dietary counseling and surveillance: Secondary | ICD-10-CM | POA: Diagnosis not present

## 2017-10-31 DIAGNOSIS — E119 Type 2 diabetes mellitus without complications: Secondary | ICD-10-CM | POA: Insufficient documentation

## 2017-10-31 DIAGNOSIS — Z6841 Body Mass Index (BMI) 40.0 and over, adult: Secondary | ICD-10-CM | POA: Insufficient documentation

## 2017-10-31 DIAGNOSIS — E1165 Type 2 diabetes mellitus with hyperglycemia: Secondary | ICD-10-CM

## 2017-10-31 NOTE — Progress Notes (Signed)
Diabetes Self-Management Education  Visit Type: First/Initial  Appt. Start Time: 1335 Appt. End Time: 1535  10/31/2017  [name], identified by name and date of birth, is a 57 y.o. male with a diagnosis of Diabetes: Type 2.   ASSESSMENT  Blood pressure 122/78, height 6\' 3"  (1.905 m), weight (!) 420 lb 4.8 oz (190.6 kg). Body mass index is 52.53 kg/m.  Diabetes Self-Management Education - 10/31/17 1703      Visit Information   Visit Type  First/Initial      Initial Visit   Diabetes Type  Type 2    Are you currently following a meal plan?  No    Are you taking your medications as prescribed?  Yes    Date Diagnosed  2005      Health Coping   How would you rate your overall health?  Good      Psychosocial Assessment   Patient Belief/Attitude about Diabetes  Defeat/Burnout "down"    Self-care barriers  None    Self-management support  Doctor's office;Friends    Other persons present  Spouse/SO    Patient Concerns  Nutrition/Meal planning;Weight Control;Monitoring;Healthy Lifestyle;Glycemic Control    Special Needs  None    Preferred Learning Style  Auditory    Learning Readiness  Contemplating    How often do you need to have someone help you when you read instructions, pamphlets, or other written materials from your doctor or pharmacy?  1 - Never    What is the last grade level you completed in school?  12th      Pre-Education Assessment   Patient understands the diabetes disease and treatment process.  Needs Review    Patient understands incorporating nutritional management into lifestyle.  Needs Instruction    Patient undertands incorporating physical activity into lifestyle.  Needs Instruction    Patient understands using medications safely.  Needs Review    Patient understands monitoring blood glucose, interpreting and using results  Needs Review    Patient understands prevention, detection, and treatment of acute complications.  Needs Instruction    Patient  understands prevention, detection, and treatment of chronic complications.  Needs Review    Patient understands how to develop strategies to address psychosocial issues.  Needs Instruction    Patient understands how to develop strategies to promote health/change behavior.  Needs Instruction      Complications   Last HgB A1C per patient/outside source  9.1 % 09/09/17    How often do you check your blood sugar?  3-4 times/day    Fasting Blood glucose range (mg/dL)  [phone] Pt reports FBG's 75-100 mg/dL.     Postprandial Blood glucose range (mg/dL)  -- He reports pre-lunch, supper and bedtime readings range from 180-200 mg/dL with a few readings of 400 mg/dL in the last few week.     Number of hypoglycemic episodes per month  1    Can you tell when your blood sugar is low?  Yes    What do you do if your blood sugar is low?  "eat a sandwich"    Have you had a dilated eye exam in the past 12 months?  Yes    Have you had a dental exam in the past 12 months?  Yes all teeth were removed      Dietary Intake   Breakfast  pt reports eating 1 meal/day - reports getting food from Food Bank    Snack (morning)  occasional snack - popcorn, crackers  Lunch  skips    Dinner  steak, chicken, sweet potato, beans, peas, occasional corn, salad, tomatoes, greens    Beverage(s)  water, sugar sweetened tea, fruit juices coffee, diet soda      Exercise   Exercise Type  ADL's      Patient Education   Previous Diabetes Education  Yes (please comment)    Disease state   Explored patient's options for treatment of their diabetes    Nutrition management   Role of diet in the treatment of diabetes and the relationship between the three main macronutrients and blood glucose level;Reviewed blood glucose goals for pre and post meals and how to evaluate the patients' food intake on their blood glucose level.    Physical activity and exercise   Role of exercise on diabetes management, blood pressure control and cardiac  health.    Medications  Taught/reviewed insulin injection, site rotation, insulin storage and needle disposal.;Reviewed patients medication for diabetes, action, purpose, timing of dose and side effects.    Monitoring  Purpose and frequency of SMBG.;Taught/discussed recording of test results and interpretation of SMBG.;Identified appropriate SMBG and/or A1C goals.    Acute complications  Taught treatment of hypoglycemia - the 15 rule.    Chronic complications  Relationship between chronic complications and blood glucose control    Psychosocial adjustment  Identified and addressed patients feelings and concerns about diabetes      Individualized Goals (developed by patient)   Reducing Risk Improve blood sugars Prevent diabetes complications Lose weight Lead a healthier lifestyle Become more fit     Outcomes   Expected Outcomes  Demonstrated interest in learning. Expect positive outcomes       Individualized Plan for Diabetes Self-Management Training:   Learning Objective:  Patient will have a greater understanding of diabetes self-management. Patient education plan is to attend individual and/or group sessions per assessed needs and concerns.   Plan:   Patient Instructions  Check blood sugars 4 x day before each meal and before bed every day Bring blood sugar records to the next appointment Exercise: Begin walking  for 10-15  minutes   3 days a week and gradually increase to 30 minutes 5 x week Eat 3 meals day,  1 snacks a day Space meals 4-6 hours apart Don't skip meals Avoid sugar sweetened drinks (tea, juices) Complete 3 Day Food Record and bring to next appt Carry fast acting glucose and a snack at all times Rotate injection sites Return for appointment on:  Monday November 21, 2017 with Pam (dietitian)   Expected Outcomes:  Demonstrated interest in learning. Expect positive outcomes  Education material provided:  General Meal Planning Guidelines Simple Meal Plan 3 Day  Food Record Glucose tablets Symptoms, causes and treatments of Hypoglycemia  If problems or questions, patient to contact team via:  Sharion Settler, RN, CCM, CDE [phone]  Future DSME appointment:  November 21, 2017 with the dietitian

## 2017-10-31 NOTE — Patient Instructions (Addendum)
Check blood sugars 4 x day before each meal and before bed every day Bring blood sugar records to the next appointment  Exercise: Begin walking  for 10-15  minutes   3 days a week and gradually increase to 30 minutes 5 x week  Eat 3 meals day,  1 snacks a day Space meals 4-6 hours apart Don't skip meals Avoid sugar sweetened drinks (tea, juices) Complete 3 Day Food Record and bring to next appt  Carry fast acting glucose and a snack at all times Rotate injection sites  Return for appointment on:  Monday November 21, 2017 with Pam (dietitian)

## 2017-11-01 ENCOUNTER — Encounter: Payer: Self-pay | Admitting: Nurse Practitioner

## 2017-11-01 ENCOUNTER — Other Ambulatory Visit: Payer: Self-pay | Admitting: Nurse Practitioner

## 2017-11-01 ENCOUNTER — Ambulatory Visit (INDEPENDENT_AMBULATORY_CARE_PROVIDER_SITE_OTHER): Payer: Medicare HMO | Admitting: Nurse Practitioner

## 2017-11-01 ENCOUNTER — Ambulatory Visit (INDEPENDENT_AMBULATORY_CARE_PROVIDER_SITE_OTHER): Payer: Medicare HMO

## 2017-11-01 VITALS — BP 115/55 | HR 67 | Temp 98.7°F | Resp 15 | Ht 75.0 in | Wt >= 6400 oz

## 2017-11-01 VITALS — BP 115/55 | HR 67 | Temp 98.7°F | Ht 75.0 in | Wt >= 6400 oz

## 2017-11-01 DIAGNOSIS — Z114 Encounter for screening for human immunodeficiency virus [HIV]: Secondary | ICD-10-CM

## 2017-11-01 DIAGNOSIS — Z0000 Encounter for general adult medical examination without abnormal findings: Secondary | ICD-10-CM

## 2017-11-01 DIAGNOSIS — F319 Bipolar disorder, unspecified: Secondary | ICD-10-CM

## 2017-11-01 DIAGNOSIS — E349 Endocrine disorder, unspecified: Secondary | ICD-10-CM

## 2017-11-01 DIAGNOSIS — Z1159 Encounter for screening for other viral diseases: Secondary | ICD-10-CM

## 2017-11-01 DIAGNOSIS — E119 Type 2 diabetes mellitus without complications: Secondary | ICD-10-CM

## 2017-11-01 LAB — POCT UA - MICROALBUMIN: Microalbumin Ur, POC: 50 mg/L

## 2017-11-01 MED ORDER — GABAPENTIN 300 MG PO CAPS: 300.0000 mg | ORAL_CAPSULE | Freq: Four times a day (QID) | ORAL | 1 refills | Status: DC

## 2017-11-01 NOTE — Progress Notes (Signed)
Subjective:    Patient ID: Philip [name], male    DOB: [birth year], 57 y.o.   MRN: [id number]  Philip [name] is a 57 y.o. male presenting on 11/01/2017 for No chief complaint on file.   HPI Diabetes Pt presents today for follow up of Type 2 diabetes mellitus. He is checking CBG at home with a range of 70-250, usual fasting 130-150.  A few readings at 400 at bedtime.    - Current diabetic medications include: Metformin ER 1000 mg twice daily, Victoza once daily, Toujeo 62 units twice daily, NovoLog 20 units with meals and 15 units additional if blood sugar over 200.  Patient is not using correction scale correctly. - He is not currently symptomatic.  - He denies polydipsia, polyphagia, polyuria, headaches, diaphoresis, shakiness, chills, pain, numbness or tingling in extremities and changes in vision.   - Clinical course has been unchanged. - He  reports no regular exercise routine. - His diet is moderate in salt, moderate in fat, and high in carbohydrates. --> Ensure at breakfast (ensure plus - 1g sugar, high protein) --> Lunch: can soup or sandwich(wheat bread) --> Dinner: Chicken/Steak/Fish, Salad, baked potato,   - Weight trend: stable   PREVENTION:  Eye exam current (within one year): yes  Foot exam current (within one year): yes  Lipid/ASCVD risk reduction - on statin: yes Kidney protection - on ace or arb: yes Recent Labs    09/09/17 0938  HGBA1C 9.1   Low testosterone history Patient reports a history of low testosterone.  He was on gel testosterone, but that caused a rash.  Patient had also previously been tried on testosterone patches which also caused a rash.  He was last treated by Dr. Zettie Cooley with atrium help.  At that time he was receiving 200 mg testosterone twice weekly.  He has not received any testosterone since moving to [facility] in approximately May 2018.  He desires to resume therapy and requests referral today.  Symptoms include fatigue and  erectile dysfunction  Social History   Tobacco Use  . Smoking status: Never Smoker  . Smokeless tobacco: Never Used  Substance Use Topics  . Alcohol use: Yes    Comment: occasionally  . Drug use: No    Review of Systems Per HPI unless specifically indicated above     Objective:    BP (!) 115/55 (BP Location: Right Arm, Patient Position: Sitting, Cuff Size: Large)   Pulse 67   Temp 98.7 F (37.1 C) (Oral)   Ht 6\' 3"  (1.905 m)   Wt (!) 418 lb 8 oz (189.8 kg)   BMI 52.31 kg/m   Wt Readings from Last 3 Encounters:  11/01/17 (!) 418 lb 8 oz (189.8 kg)  11/01/17 (!) 418 lb 8 oz (189.8 kg)  10/31/17 (!) 420 lb 4.8 oz (190.6 kg)    Physical Exam General - morbidly obese, well-appearing, NAD HEENT - Normocephalic, atraumatic Neck - supple, non-tender, no LAD, no carotid bruit Heart - RRR, no murmurs heard, distant heart sounds due to body habitus Lungs - Clear throughout all lobes, no wheezing, crackles, or rhonchi. Normal work of breathing, but decreased air movement. Extremeties - non-tender, trace pedal edema, cap refill < 2 seconds, peripheral pulses intact +2 bilaterally Skin - warm, dry Neuro - awake, alert, oriented x3, antalgic gait Psych - Normal mood and affect, normal behavior   Results for orders placed or performed in visit on 11/01/17  POCT UA - Microalbumin  Result Value  Ref Range   Microalbumin Ur, POC 50 mg/L   Creatinine, POC  mg/dL   Albumin/Creatinine Ratio, Urine, POC        Assessment & Plan:   Problem List Items Addressed This Visit      Endocrine   Diabetes mellitus without complication (HCC) - Primary    Patient remains uncontrolled with home CBG checks that are labile, but almost always over 200 later in the day and between 70-250 in the morning.  Last A1c 9.1 in clinic in November 2018.  Patient reports no hypoglycemic events. Patient is on very high dose Toujeo 62 units twice daily and only NovoLog 20 units plus sliding scale insulin for  meals and is more balanced basal bolus ratio.  Patient reports feeling better at this dose.  Continue to suspect possible reactive hyperglycemia.  Irregular meals with generally one large meal during the day contributes to poor glycemic control.  Plan: 1.  Continue Toujeo to 62 units twice daily.  2.  Continue Lantus 20 units with meals plus correction scale insulin. -For correction scale, patient had knowledge deficit.  Reeducated patient about correction scale insulin.  If checking glucose at a mealtime without a meal and blood sugar is higher than 150, administer correction scale insulin only.  Patient verbalizes understanding. -Current correction scale is:  blood sugar 120-150, take 4 units correction insulin in addition to your meal insulin blood sugar 151-200, take 6 units blood sugar 201-250, take 8 units blood sugar 251-300, take 10 units blood sugar 301-350, take 12 units blood sugar 351-400, take 14 units blood sugar > 400, take 14 units and call clinic. 3.  Continue Victoza and metformin without changes. 4.  Reviewed low glycemic diet.  Encouraged regular mealtimes.  If no full meal at least eat a small snack. 5.  Encouraged annual eye exam.  Patient verbalizes will likely have it completed in January. 6.  Follow-up in 4 weeks with blood sugar log.      Relevant Medications   gabapentin (NEURONTIN) 300 MG capsule   Other Relevant Orders   POCT UA - Microalbumin (Completed)   Ambulatory referral to Podiatry    Other Visit Diagnoses    Testosterone deficiency     No recent lab evaluation of testosterone.  Patient records from Dr. Zettie CooleySpeiser were requested but not received.  Patient continues to want testosterone replacement.  Plan: 1.  Will refer to urology for evaluation and treatment.   Relevant Orders   Ambulatory referral to Urology      Meds ordered this encounter  Medications  . gabapentin (NEURONTIN) 300 MG capsule    Sig: Take 1 capsule (300 mg total) by mouth 4  (four) times daily.    Dispense:  360 capsule    Refill:  1    Order Specific Question:   Supervising Provider    Answer:   Smitty CordsKARAMALEGOS, ALEXANDER J [[id number]]     Follow up plan: Return in about 2 months (around 12/30/2017) for diabetes (A1c).  Wilhelmina McardleLauren Therasa Lorenzi, DNP, AGPCNP-BC Adult Gerontology Primary Care Nurse Practitioner [facility] 11/02/2017, 7:51 AM

## 2017-11-01 NOTE — Patient Instructions (Addendum)
Philip [name] , Thank you for taking time to come for your Medicare Wellness Visit. I appreciate your ongoing commitment to your health goals. Please review the following plan we discussed and let me know if I can assist you in the future.   Screening recommendations/referrals: Colonoscopy: due now- ordered cologuard  Recommended yearly ophthalmology/optometry visit for glaucoma screening and checkup Recommended yearly dental visit for hygiene and checkup  Vaccinations: Influenza vaccine: up to date Pneumococcal vaccine: due at age 27 Tdap vaccine: due, check with your insurance company for coverage Shingles vaccine: due, check with your insurance company for coverage   Advanced directives: Advance directive discussed with you today. I have provided a copy for you to complete at home and have notarized. Once this is complete please bring a copy in to our office so we can scan it into your chart.  Conditions/risks identified: Recommend drinking at least 6-8 glasses of water a day   Next appointment: Follow up in one year for your annual wellness exam.  Preventive Care 40-64 Years, Male Preventive care refers to lifestyle choices and visits with your health care provider that can promote health and wellness. What does preventive care include?  A yearly physical exam. This is also called an annual well check.  Dental exams once or twice a year.  Routine eye exams. Ask your health care provider how often you should have your eyes checked.  Personal lifestyle choices, including:  Daily care of your teeth and gums.  Regular physical activity.  Eating a healthy diet.  Avoiding tobacco and drug use.  Limiting alcohol use.  Practicing safe sex.  Taking low-dose aspirin every day starting at age 32. What happens during an annual well check? The services and screenings done by your health care provider during your annual well check will depend on your age, overall health, lifestyle  risk factors, and family history of disease. Counseling  Your health care provider may ask you questions about your:  Alcohol use.  Tobacco use.  Drug use.  Emotional well-being.  Home and relationship well-being.  Sexual activity.  Eating habits.  Work and work Astronomer. Screening  You may have the following tests or measurements:  Height, weight, and BMI.  Blood pressure.  Lipid and cholesterol levels. These may be checked every 5 years, or more frequently if you are over 4 years old.  Skin check.  Lung cancer screening. You may have this screening every year starting at age 53 if you have a 30-pack-year history of smoking and currently smoke or have quit within the past 15 years.  Fecal occult blood test (FOBT) of the stool. You may have this test every year starting at age 68.  Flexible sigmoidoscopy or colonoscopy. You may have a sigmoidoscopy every 5 years or a colonoscopy every 10 years starting at age 103.  Prostate cancer screening. Recommendations will vary depending on your family history and other risks.  Hepatitis C blood test.  Hepatitis B blood test.  Sexually transmitted disease (STD) testing.  Diabetes screening. This is done by checking your blood sugar (glucose) after you have not eaten for a while (fasting). You may have this done every 1-3 years. Discuss your test results, treatment options, and if necessary, the need for more tests with your health care provider. Vaccines  Your health care provider may recommend certain vaccines, such as:  Influenza vaccine. This is recommended every year.  Tetanus, diphtheria, and acellular pertussis (Tdap, Td) vaccine. You may need a Td booster  every 10 years.  Zoster vaccine. You may need this after age 56.  Pneumococcal 13-valent conjugate (PCV13) vaccine. You may need this if you have certain conditions and have not been vaccinated.  Pneumococcal polysaccharide (PPSV23) vaccine. You may need one  or two doses if you smoke cigarettes or if you have certain conditions. Talk to your health care provider about which screenings and vaccines you need and how often you need them. This information is not intended to replace advice given to you by your health care provider. Make sure you discuss any questions you have with your health care provider. Document Released: 10/24/2015 Document Revised: 06/16/2016 Document Reviewed: 07/29/2015 Elsevier Interactive Patient Education  2017 [employer]  Fall Prevention in the Home Falls can cause injuries. They can happen to people of all ages. There are many things you can do to make your home safe and to help prevent falls. What can I do on the outside of my home?  Regularly fix the edges of walkways and driveways and fix any cracks.  Remove anything that might make you trip as you walk through a door, such as a raised step or threshold.  Trim any bushes or trees on the path to your home.  Use bright outdoor lighting.  Clear any walking paths of anything that might make someone trip, such as rocks or tools.  Regularly check to see if handrails are loose or broken. Make sure that both sides of any steps have handrails.  Any raised decks and porches should have guardrails on the edges.  Have any leaves, snow, or ice cleared regularly.  Use sand or salt on walking paths during winter.  Clean up any spills in your garage right away. This includes oil or grease spills. What can I do in the bathroom?  Use night lights.  Install grab bars by the toilet and in the tub and shower. Do not use towel bars as grab bars.  Use non-skid mats or decals in the tub or shower.  If you need to sit down in the shower, use a plastic, non-slip stool.  Keep the floor dry. Clean up any water that spills on the floor as soon as it happens.  Remove soap buildup in the tub or shower regularly.  Attach bath mats securely with double-sided non-slip rug  tape.  Do not have throw rugs and other things on the floor that can make you trip. What can I do in the bedroom?  Use night lights.  Make sure that you have a light by your bed that is easy to reach.  Do not use any sheets or blankets that are too big for your bed. They should not hang down onto the floor.  Have a firm chair that has side arms. You can use this for support while you get dressed.  Do not have throw rugs and other things on the floor that can make you trip. What can I do in the kitchen?  Clean up any spills right away.  Avoid walking on wet floors.  Keep items that you use a lot in easy-to-reach places.  If you need to reach something above you, use a strong step stool that has a grab bar.  Keep electrical cords out of the way.  Do not use floor polish or wax that makes floors slippery. If you must use wax, use non-skid floor wax.  Do not have throw rugs and other things on the floor that can make you trip. What  can I do with my stairs?  Do not leave any items on the stairs.  Make sure that there are handrails on both sides of the stairs and use them. Fix handrails that are broken or loose. Make sure that handrails are as long as the stairways.  Check any carpeting to make sure that it is firmly attached to the stairs. Fix any carpet that is loose or worn.  Avoid having throw rugs at the top or bottom of the stairs. If you do have throw rugs, attach them to the floor with carpet tape.  Make sure that you have a light switch at the top of the stairs and the bottom of the stairs. If you do not have them, ask someone to add them for you. What else can I do to help prevent falls?  Wear shoes that:  Do not have high heels.  Have rubber bottoms.  Are comfortable and fit you well.  Are closed at the toe. Do not wear sandals.  If you use a stepladder:  Make sure that it is fully opened. Do not climb a closed stepladder.  Make sure that both sides of the  stepladder are locked into place.  Ask someone to hold it for you, if possible.  Clearly mark and make sure that you can see:  Any grab bars or handrails.  First and last steps.  Where the edge of each step is.  Use tools that help you move around (mobility aids) if they are needed. These include:  Canes.  Walkers.  Scooters.  Crutches.  Turn on the lights when you go into a dark area. Replace any light bulbs as soon as they burn out.  Set up your furniture so you have a clear path. Avoid moving your furniture around.  If any of your floors are uneven, fix them.  If there are any pets around you, be aware of where they are.  Review your medicines with your doctor. Some medicines can make you feel dizzy. This can increase your chance of falling. Ask your doctor what other things that you can do to help prevent falls. This information is not intended to replace advice given to you by your health care provider. Make sure you discuss any questions you have with your health care provider. Document Released: 07/24/2009 Document Revised: 03/04/2016 Document Reviewed: 11/01/2014 Elsevier Interactive Patient Education  2017 [employer].

## 2017-11-01 NOTE — Patient Instructions (Signed)
Philip [name], Thank you for coming in to clinic today.  1. For your correction scale:  If your blood sugar is:  120-150, take 4 units correction insulin in addition to your meal insulin 151-200, take 6 units 201-250, take 8 units 251-300, take 10 units 301-350, take 12 units 351-400, take 14 units > 400, take 14 units and call clinic.  Continue meal time insulin dosage of 20 units with large meals. If you are not eating, do not give meal insulin but DO give your correction scale insulin.  2. For your testosterone, They will call you to schedule an appointment with urology.  3. For your feet, you will have an appointment at [facility].  They will call you for your appointment.   Please schedule a follow-up appointment with Philip [name], AGNP. Return in about 2 months (around 12/30/2017) for diabetes (A1c).  If you have any other questions or concerns, please feel free to call the clinic or send a message through MyChart. You may also schedule an earlier appointment if necessary.  You will receive a survey after today's visit either digitally by e-mail or paper by [employer] mail. Your experiences and feedback matter to us.  Please respond so we know how we are doing as we provide care for you.   Philip McardleLauren Autumne Kallio, DNP, AGNP-BC Adult Gerontology Nurse Practitioner [facility], [facility]

## 2017-11-01 NOTE — Progress Notes (Signed)
Subjective:   Philip [name] is a 57 y.o. male who presents for an Initial Medicare Annual Wellness Visit.    Review of Systems    Cardiac Risk Factors include: male gender;advanced age (>72men, >13 women);diabetes mellitus;obesity (BMI >30kg/m2)    Objective:    Today's Vitals   11/01/17 1442 11/01/17 1509  BP: (!) 115/55   Pulse: 67   Resp: 15   Temp: 98.7 F (37.1 C)   TempSrc: Oral   Weight: (!) 418 lb 8 oz (189.8 kg)   Height: 6\' 3"  (1.905 m)   PainSc:  8    Body mass index is 52.31 kg/m.  Advanced Directives 11/01/2017 10/31/2017  Does Patient Have a Medical Advance Directive? Yes No  Does patient want to make changes to medical advance directive? Yes (MAU/Ambulatory/Procedural Areas - Information given) -  Would patient like information on creating a medical advance directive? - Yes (MAU/Ambulatory/Procedural Areas - Information given)    Current Medications (verified) Outpatient Encounter Medications as of 11/01/2017  Medication Sig  . albuterol (VENTOLIN HFA) 108 (90 Base) MCG/ACT inhaler INL 1 TO 2 PFS PO Q 4 TO 6 H PRF WHZ  . aspirin EC 81 MG tablet Take 81 mg by mouth daily.  Marland Kitchen atorvastatin (LIPITOR) 10 MG tablet Take 1 tablet (10 mg total) by mouth daily.  . B-D ULTRAFINE III SHORT PEN 31G X 8 MM MISC Inject 1 Device into the skin 6 (six) times daily.  . budesonide-formoterol (SYMBICORT) 160-4.5 MCG/ACT inhaler INL 2 PFS PO BID  . buPROPion (WELLBUTRIN SR) 100 MG 12 hr tablet TK 2 TS PO BID  . Cholecalciferol (VITAMIN D PO) Take 1 capsule by mouth daily.  . citalopram (CELEXA) 40 MG tablet Take 40 mg by mouth daily.  . clonazePAM (KLONOPIN) 1 MG tablet TAKE 1 TABLET BY MOUTH THREE TIMES DAILY AS NEEDED FOR ANXIETY  . diclofenac (VOLTAREN) 75 MG EC tablet Take 1 tablet (75 mg total) by mouth 2 (two) times daily.  . diclofenac sodium (VOLTAREN) 1 % GEL Apply 4 g topically 4 (four) times daily.  . furosemide (LASIX) 40 MG tablet Take 1 tablet (40 mg total)  by mouth daily.  Marland Kitchen gabapentin (NEURONTIN) 300 MG capsule Take 1 capsule (300 mg total) by mouth 3 (three) times daily.  . Insulin Glargine (TOUJEO SOLOSTAR) 300 UNIT/ML SOPN Inject 70 Units into the skin 2 (two) times daily. (Patient taking differently: Inject 62 Units into the skin 2 (two) times daily. )  . levothyroxine (SYNTHROID, LEVOTHROID) 200 MCG tablet Take 1 tablet (200 mcg total) by mouth daily before breakfast.  . lisinopril (PRINIVIL,ZESTRIL) 40 MG tablet Take 1 tablet (40 mg total) by mouth daily.  . metFORMIN (GLUCOPHAGE-XR) 500 MG 24 hr tablet Take 2 tablets (1,000 mg total) by mouth 2 (two) times daily with a meal.  . Multiple Vitamin (MULTIVITAMIN WITH MINERALS) TABS tablet Take 1 tablet by mouth daily.  . multivitamin-lutein (OCUVITE-LUTEIN) CAPS capsule Take 1 capsule by mouth daily.  Marland Kitchen NOVOLOG FLEXPEN 100 UNIT/ML FlexPen INJECT 20 UNITS UNDER THE SKIN TID BEFORE MEALS plus Sliding Scale correction up to 15 additional units per dose.  Marland Kitchen omeprazole (PRILOSEC) 40 MG capsule Take 1 capsule (40 mg total) by mouth 2 (two) times daily.  . potassium chloride SA (K-DUR,KLOR-CON) 20 MEQ tablet Take 1 tablet (20 mEq total) by mouth daily.  . Testosterone (ANDROGEL PUMP) 20.25 MG/ACT (1.62%) GEL   . Tiotropium Bromide Monohydrate (SPIRIVA RESPIMAT) 1.25 MCG/ACT AERS INL 2  PFS PO D  . VICTOZA 18 MG/3ML SOPN Inject 0.3 mLs (1.8 mg total) into the skin daily.  . ziprasidone (GEODON) 20 MG capsule Take 1 capsule (20 mg total) by mouth 2 (two) times daily with a meal.   No facility-administered encounter medications on file as of 11/01/2017.     Allergies (verified) Aripiprazole; Duloxetine hcl; Fenofibrate; Lamotrigine; Pregabalin; Simvastatin; Latex; and Valproic acid   History: Past Medical History:  Diagnosis Date  . Anxiety   . Asthma   . Bipolar 1 disorder (HCC)   . COPD (chronic obstructive pulmonary disease) (HCC)   . Depression   . Diabetes mellitus without complication (HCC)    . GERD (gastroesophageal reflux disease)   . Osteoarthritis   . Thyroid disease    Past Surgical History:  Procedure Laterality Date  . CHOLECYSTECTOMY    . SHOULDER SURGERY Right   . UMBILICAL HERNIA REPAIR     Family History  Problem Relation Age of Onset  . Depression Mother   . Diabetes Mother   . Heart disease Mother   . Depression Father   . Lung cancer Father   . Diabetes Father   . Breast cancer Sister   . Anxiety disorder Sister   . Depression Sister   . Stroke Neg Hx    Social History   Socioeconomic History  . Marital status: Single    Spouse name: None  . Number of children: None  . Years of education: None  . Highest education level: None  Social Needs  . Financial resource strain: Hard  . Food insecurity - worry: Often true  . Food insecurity - inability: Often true  . Transportation needs - medical: Yes  . Transportation needs - non-medical: Yes  Occupational History  . None  Tobacco Use  . Smoking status: Never Smoker  . Smokeless tobacco: Never Used  Substance and Sexual Activity  . Alcohol use: Yes    Comment: occasionally  . Drug use: No  . Sexual activity: No    Birth control/protection: None  Other Topics Concern  . None  Social History Narrative  . None   Tobacco Counseling Counseling given: Not Answered   Clinical Intake:  Pre-visit preparation completed: Yes  Pain : 0-10 Pain Score: 8  Pain Location: Knee Pain Orientation: Right, Left Pain Descriptors / Indicators: Aching Pain Onset: More than a month ago Pain Frequency: Intermittent Pain Relieving Factors: voltaran gel   Pain Relieving Factors: voltaran gel   Nutritional Status: BMI > 30  Obese Nutritional Risks: None Diabetes: Yes CBG done?: No Did pt. bring in CBG monitor from home?: No  How often do you need to have someone help you when you read instructions, pamphlets, or other written materials from your doctor or pharmacy?: 1 - Never What is the last  grade level you completed in school?: 12th grade  Interpreter Needed?: No  Information entered by :: Ladelle Teodoro,LPN   Activities of Daily Living In your present state of health, do you have any difficulty performing the following activities: 11/01/2017 07/05/2017  Hearing? Y N  Comment advised of hearing clinics -  Vision? [name]  Comment needs new glasses -  Difficulty concentrating or making decisions? Y N  Walking or climbing stairs? Y N  Comment knee pain  -  Dressing or bathing? N N  Doing errands, shopping? N N  Preparing Food and eating ? N -  Using the Toilet? N -  In the past six months, have  you accidently leaked urine? N -  Do you have problems with loss of bowel control? N -  Managing your Medications? N -  Managing your Finances? N -  Housekeeping or managing your Housekeeping? N -     Immunizations and Health Maintenance Immunization History  Administered Date(s) Administered  . Influenza,inj,Quad PF,6+ Mos 09/22/2017   Health Maintenance Due  Topic Date Due  . Hepatitis C Screening  [birth year]  . HIV Screening  10/03/1977  . COLONOSCOPY  10/03/2012    Patient Care Team: Galen Manila, NP as PCP - General (Nurse Practitioner)  Indicate any recent Medical Services you may have received from other than [facility] providers in the past year (date may be approximate).    Assessment:   This is a routine wellness examination for Macedonio.  Hearing/Vision screen Vision Screening Comments: Sees dr. Clydene Pugh annually   Dietary issues and exercise activities discussed: Current Exercise Habits: The patient does not participate in regular exercise at present, Exercise limited by: orthopedic condition(s)  Goals    . DIET - INCREASE WATER INTAKE     Recommend drinking at least 6-8 glasses of water a day       Depression Screen PHQ 2/9 Scores 11/01/2017 10/31/2017 09/09/2017 07/05/2017  PHQ - 2 Score 2 2 2 5   PHQ- 9 Score 9 8 4 20     Fall Risk Fall Risk   11/01/2017 10/31/2017  Falls in the past year? Yes Yes  Number falls in past yr: 2 or more 2 or more  Injury with Fall? No No  Risk Factor Category  High Fall Risk High Fall Risk  Risk for fall due to : - Impaired mobility;History of fall(s)  Follow up Falls prevention discussed Education provided;Falls prevention discussed    Is the patient's home free of loose throw rugs in walkways, pet beds, electrical cords, etc?   yes      Grab bars in the bathroom? No       Handrails on the stairs?   yes      Adequate lighting?   yes  Timed Get Up and Go performed: completed in 8 seconds with no use of assistive devices. Steady gait. No intervention needed at this time.   Cognitive Function:     6CIT Screen 11/01/2017  What Year? 0 points  What month? 0 points  What time? 0 points  Count back from 20 0 points  Months in reverse 0 points  Repeat phrase 0 points  Total Score 0    Screening Tests Health Maintenance  Topic Date Due  . Hepatitis C Screening  October 11, 1961  . HIV Screening  10/03/1977  . COLONOSCOPY  10/03/2012  . HEMOGLOBIN A1C  03/09/2018  . FOOT EXAM  09/09/2018  . OPHTHALMOLOGY EXAM  10/26/2018  . PNEUMOCOCCAL POLYSACCHARIDE VACCINE (2) 11/11/2021  . TETANUS/TDAP  06/11/2024  . INFLUENZA VACCINE  Completed    Qualifies for Shingles Vaccine? Discussed shingrix vaccine  Cancer Screenings: Lung: Low Dose CT Chest recommended if Age 56-80 years, 30 pack-year currently smoking OR have quit w/in 15years. Patient does not qualify. Colorectal: due - ordered cologuard   Additional Screenings:  Hepatitis B/HIV/Syphillis: due, will order for next visit.  Hepatitis C Screening: due, will order for next visit.       Plan:    I have personally reviewed and addressed the Medicare Annual Wellness questionnaire and have noted the following in the patient's chart:  A. Medical and social history B. Use of alcohol, tobacco  or illicit drugs  C. Current medications and  supplements D. Functional ability and status E.  Nutritional status F.  Physical activity G. Advance directives H. List of other physicians I.  Hospitalizations, surgeries, and ER visits in previous 12 months J.  Vitals K. Screenings such as hearing and vision if needed, cognitive and depression L. Referrals and appointments   In addition, I have reviewed and discussed with patient certain preventive protocols, quality metrics, and best practice recommendations. A written personalized care plan for preventive services as well as general preventive health recommendations were provided to patient.   Signed,  Marin Robertsiffany Richelle Glick, LPN Nurse Health Advisor   Nurse Notes: none

## 2017-11-02 ENCOUNTER — Encounter: Payer: Self-pay | Admitting: Nurse Practitioner

## 2017-11-02 ENCOUNTER — Other Ambulatory Visit: Payer: Self-pay

## 2017-11-02 DIAGNOSIS — F319 Bipolar disorder, unspecified: Secondary | ICD-10-CM

## 2017-11-02 DIAGNOSIS — M150 Primary generalized (osteo)arthritis: Secondary | ICD-10-CM

## 2017-11-02 DIAGNOSIS — E119 Type 2 diabetes mellitus without complications: Secondary | ICD-10-CM

## 2017-11-02 DIAGNOSIS — M159 Polyosteoarthritis, unspecified: Secondary | ICD-10-CM

## 2017-11-02 DIAGNOSIS — E785 Hyperlipidemia, unspecified: Secondary | ICD-10-CM

## 2017-11-02 DIAGNOSIS — K219 Gastro-esophageal reflux disease without esophagitis: Secondary | ICD-10-CM

## 2017-11-02 DIAGNOSIS — E039 Hypothyroidism, unspecified: Secondary | ICD-10-CM

## 2017-11-02 DIAGNOSIS — E1169 Type 2 diabetes mellitus with other specified complication: Secondary | ICD-10-CM

## 2017-11-02 MED ORDER — ATORVASTATIN CALCIUM 10 MG PO TABS: 10.0000 mg | ORAL_TABLET | Freq: Every day | ORAL | 5 refills | Status: DC

## 2017-11-02 MED ORDER — POTASSIUM CHLORIDE CRYS ER 20 MEQ PO TBCR: 20.0000 meq | EXTENDED_RELEASE_TABLET | Freq: Every day | ORAL | 1 refills | Status: DC

## 2017-11-02 MED ORDER — INSULIN GLARGINE 300 UNIT/ML ~~LOC~~ SOPN: 62.0000 [IU] | PEN_INJECTOR | Freq: Two times a day (BID) | SUBCUTANEOUS | 5 refills | Status: DC

## 2017-11-02 MED ORDER — GABAPENTIN 300 MG PO CAPS: 300.0000 mg | ORAL_CAPSULE | Freq: Four times a day (QID) | ORAL | 1 refills | Status: DC

## 2017-11-02 MED ORDER — OMEPRAZOLE 40 MG PO CPDR: 40.0000 mg | DELAYED_RELEASE_CAPSULE | Freq: Two times a day (BID) | ORAL | 5 refills | Status: DC

## 2017-11-02 MED ORDER — DICLOFENAC SODIUM 1 % TD GEL: 4.0000 g | Freq: Four times a day (QID) | TRANSDERMAL | 2 refills | Status: DC

## 2017-11-02 MED ORDER — LISINOPRIL 40 MG PO TABS: 40.0000 mg | ORAL_TABLET | Freq: Every day | ORAL | 2 refills | Status: DC

## 2017-11-02 MED ORDER — BUDESONIDE-FORMOTEROL FUMARATE 160-4.5 MCG/ACT IN AERO: 2.0000 | INHALATION_SPRAY | Freq: Two times a day (BID) | RESPIRATORY_TRACT | 5 refills | Status: DC

## 2017-11-02 MED ORDER — METFORMIN HCL ER 500 MG PO TB24: 1000.0000 mg | ORAL_TABLET | Freq: Two times a day (BID) | ORAL | 2 refills | Status: DC

## 2017-11-02 MED ORDER — DICLOFENAC SODIUM 75 MG PO TBEC: 75.0000 mg | DELAYED_RELEASE_TABLET | Freq: Two times a day (BID) | ORAL | 2 refills | Status: DC

## 2017-11-02 MED ORDER — NOVOLOG FLEXPEN 100 UNIT/ML ~~LOC~~ SOPN: PEN_INJECTOR | SUBCUTANEOUS | 6 refills | Status: DC

## 2017-11-02 MED ORDER — FUROSEMIDE 40 MG PO TABS: 40.0000 mg | ORAL_TABLET | Freq: Every day | ORAL | 3 refills | Status: DC

## 2017-11-02 MED ORDER — VICTOZA 18 MG/3ML ~~LOC~~ SOPN: 1.8000 mg | PEN_INJECTOR | Freq: Every day | SUBCUTANEOUS | 2 refills | Status: DC

## 2017-11-02 MED ORDER — LEVOTHYROXINE SODIUM 200 MCG PO TABS: 200.0000 ug | ORAL_TABLET | Freq: Every day | ORAL | 5 refills | Status: DC

## 2017-11-02 NOTE — Assessment & Plan Note (Signed)
Patient remains uncontrolled with home CBG checks that are labile, but almost always over 200 later in the day and between 70-250 in the morning.  Last A1c 9.1 in clinic in November 2018.  Patient reports no hypoglycemic events. Patient is on very high dose Toujeo 62 units twice daily and only NovoLog 20 units plus sliding scale insulin for meals and is more balanced basal bolus ratio.  Patient reports feeling better at this dose.  Continue to suspect possible reactive hyperglycemia.  Irregular meals with generally one large meal during the day contributes to poor glycemic control.  Plan: 1.  Continue Toujeo to 62 units twice daily.  2.  Continue Lantus 20 units with meals plus correction scale insulin. -For correction scale, patient had knowledge deficit.  Reeducated patient about correction scale insulin.  If checking glucose at a mealtime without a meal and blood sugar is higher than 150, administer correction scale insulin only.  Patient verbalizes understanding. -Current correction scale is:  blood sugar 120-150, take 4 units correction insulin in addition to your meal insulin blood sugar 151-200, take 6 units blood sugar 201-250, take 8 units blood sugar 251-300, take 10 units blood sugar 301-350, take 12 units blood sugar 351-400, take 14 units blood sugar > 400, take 14 units and call clinic. 3.  Continue Victoza and metformin without changes. 4.  Reviewed low glycemic diet.  Encouraged regular mealtimes.  If no full meal at least eat a small snack. 5.  Encouraged annual eye exam.  Patient verbalizes will likely have it completed in January. 6.  Follow-up in 4 weeks with blood sugar log.

## 2017-11-08 ENCOUNTER — Encounter: Payer: Self-pay | Admitting: Nurse Practitioner

## 2017-11-08 ENCOUNTER — Ambulatory Visit (INDEPENDENT_AMBULATORY_CARE_PROVIDER_SITE_OTHER): Payer: Medicare HMO | Admitting: Nurse Practitioner

## 2017-11-08 ENCOUNTER — Other Ambulatory Visit: Payer: Self-pay

## 2017-11-08 VITALS — BP 143/78 | HR 71 | Temp 98.7°F | Ht 75.0 in | Wt >= 6400 oz

## 2017-11-08 DIAGNOSIS — I10 Essential (primary) hypertension: Secondary | ICD-10-CM | POA: Diagnosis not present

## 2017-11-08 DIAGNOSIS — J45909 Unspecified asthma, uncomplicated: Secondary | ICD-10-CM | POA: Diagnosis not present

## 2017-11-08 DIAGNOSIS — L821 Other seborrheic keratosis: Secondary | ICD-10-CM | POA: Diagnosis not present

## 2017-11-08 DIAGNOSIS — J9611 Chronic respiratory failure with hypoxia: Secondary | ICD-10-CM | POA: Diagnosis not present

## 2017-11-08 NOTE — Patient Instructions (Addendum)
Philip [name], Thank you for coming in to clinic today.  1. Your scalp mole is probably a seborrheic keratosis. I still recommend a skin check by dermatology.    DERMATOLOGY  [facility] [address] [place], [place] [address] [phone]   Please schedule a follow-up appointment with Wilhelmina McardleLauren Nikesh Teschner, AGNP. Return if symptoms worsen or fail to improve.  If you have any other questions or concerns, please feel free to call the clinic or send a message through MyChart. You may also schedule an earlier appointment if necessary.  You will receive a survey after today's visit either digitally by e-mail or paper by [employer] mail. Your experiences and feedback matter to us.  Please respond so we know how we are doing as we provide care for you.   Wilhelmina McardleLauren Taija Mathias, DNP, AGNP-BC Adult Gerontology Nurse Practitioner [facility]   Seborrheic Keratosis Seborrheic keratosis is a common, noncancerous (benign) skin growth. This condition causes waxy, rough, tan, brown, or black spots to appear on the skin. These skin growths can be flat or raised. What are the causes? The cause of this condition is not known. What increases the risk? This condition is more likely to develop in:  People who have a family history of seborrheic keratosis.  People who are 2850 or older.  People who are pregnant.  People who have had estrogen replacement therapy.  What are the signs or symptoms? This condition often occurs on the face, chest, shoulders, back, or other areas. These growths:  Are usually painless, but may become irritated and itchy.  Can be yellow, brown, black, or other colors.  Are slightly raised or have a flat surface.  Are sometimes rough or wart-like in texture.  Are often waxy on the surface.  Are round or oval-shaped.  Sometimes look like they are "stuck on."  Often occur in groups, but may occur as a single growth.  How is this diagnosed? This  condition is diagnosed with a medical history and physical exam. A sample of the growth may be tested (skin biopsy). You may need to see a skin specialist (dermatologist). How is this treated? Treatment is not usually needed for this condition, unless the growths are irritated or are often bleeding. You may also choose to have the growths removed if you do not like their appearance. Most commonly, these growths are treated with a procedure in which liquid nitrogen is applied to "freeze" off the growth (cryosurgery). They may also be burned off with electricity or cut off. Follow these instructions at home:  Watch your growth for any changes.  Keep all follow-up visits as told by your health care provider. This is important.  Do not scratch or pick at the growth or growths. This can cause them to become irritated or infected. Contact a health care provider if:  You suddenly have many new growths.  Your growth bleeds, itches, or hurts.  Your growth suddenly becomes larger or changes color. This information is not intended to replace advice given to you by your health care provider. Make sure you discuss any questions you have with your health care provider. Document Released: 10/30/2010 Document Revised: 03/04/2016 Document Reviewed: 02/12/2015 Elsevier Interactive Patient Education  2018 [employer]

## 2017-11-08 NOTE — Progress Notes (Signed)
   Subjective:    Patient ID: Philip [name], male    DOB: 1961/03/02, 57 y.o.   MRN: [id number]  Philip [name] is a 57 y.o. male presenting on 11/08/2017 for Abrasion (black spot on the left side of the head x 2 yrs. Pt concern it could be skin cancer.)   HPI Abrasion/Mole Pt notes a mole/raised area of skin on top of head.  He is concerned about possiblity of cancer.  Notes his Father had skin cancer.  Lesion began as small, raised spot on scalp in 2009.  Initially had no pigment, but now is darker and enlarged.  Pt notes it did bleed last week for 2 days after scratching it.  Pt reports healing has been appropriate without re-bleed.  Home O2 certification Pt is requesting communication to be sent to his electric company for use of home O2. Pt uses home O2 when oxygen drops.  Uses 2L [place] - Hypoxia chronically, Drops < 80% with walk tests.   Social History   Tobacco Use  . Smoking status: Never Smoker  . Smokeless tobacco: Never Used  Substance Use Topics  . Alcohol use: Yes    Comment: occasionally  . Drug use: No    Review of Systems Per HPI unless specifically indicated above     Objective:    BP (!) 143/78 (BP Location: Right Arm, Patient Position: Sitting, Cuff Size: Large)   Pulse 71   Temp 98.7 F (37.1 C) (Oral)   Ht 6\' 3"  (1.905 m)   Wt (!) 416 lb 8 oz (188.9 kg)   BMI 52.06 kg/m   Wt Readings from Last 3 Encounters:  11/08/17 (!) 416 lb 8 oz (188.9 kg)  11/01/17 (!) 418 lb 8 oz (189.8 kg)  11/01/17 (!) 418 lb 8 oz (189.8 kg)    Physical Exam  Constitutional: He is oriented to person, place, and time. He appears well-developed and well-nourished. No distress.  HENT:  Head: Normocephalic.    Neck: Normal range of motion. Neck supple.  Cardiovascular: Normal rate, regular rhythm, S1 normal, S2 normal, normal heart sounds and intact distal pulses.  Pulmonary/Chest: Effort normal and breath sounds normal. No respiratory distress.  Neurological: He is  alert and oriented to person, place, and time.  Skin: Skin is warm and dry.  Psychiatric: He has a normal mood and affect. His behavior is normal.  Vitals reviewed.    Results for orders placed or performed in visit on 11/01/17  POCT UA - Microalbumin  Result Value Ref Range   Microalbumin Ur, POC 50 mg/L   Creatinine, POC  mg/dL   Albumin/Creatinine Ratio, Urine, POC        Assessment & Plan:   Problem List Items Addressed This Visit    None    Visit Diagnoses    Seborrheic keratosis    -  Primary Scalp lesion consistent with seborrheic keratosis vs carcinoma w/ abrasion/bleeding.  Pt reports is not recurrently bleeding, but size is concerning for malignancy.  Plan: 1. Referral to dermatology 2. Follow lesion for complete healing vs re-opening of scab. 3. Followup as needed.   Relevant Orders   Ambulatory referral to Dermatology       Follow up plan: Return if symptoms worsen or fail to improve.   Wilhelmina McardleLauren Margurite Duffy, DNP, AGPCNP-BC Adult Gerontology Primary Care Nurse Practitioner [facility] 11/16/2017, 12:02 PM

## 2017-11-11 ENCOUNTER — Telehealth: Payer: Self-pay | Admitting: Nurse Practitioner

## 2017-11-11 NOTE — Telephone Encounter (Signed)
Pt said he fell last night, he got light headed and fell in shower and hit his tail bone.  His call back number is [phone]

## 2017-11-11 NOTE — Telephone Encounter (Signed)
Attempted to contact the pt, no answer. LMOM to return my call.  

## 2017-11-14 ENCOUNTER — Other Ambulatory Visit: Payer: Self-pay | Admitting: Nurse Practitioner

## 2017-11-14 DIAGNOSIS — E119 Type 2 diabetes mellitus without complications: Secondary | ICD-10-CM

## 2017-11-14 NOTE — Telephone Encounter (Signed)
Spoke to patient going out of town until Friday he will call if it's getting worst. Advised patient to seek medical attention if continue having HA or dizziness or back pain gets worst. Pt understood very well.

## 2017-11-16 ENCOUNTER — Encounter: Payer: Self-pay | Admitting: Nurse Practitioner

## 2017-11-16 ENCOUNTER — Telehealth: Payer: Self-pay | Admitting: Nurse Practitioner

## 2017-11-16 NOTE — Telephone Encounter (Signed)
Discussed with pt that landlord had not received payment from [facility] ck was mailed out Friday, 2.1.19 check # [id number] for $500 The check should be there this week. The normal turn around time for new vendor account set up is 48 hours.  Accounts Payable is overloaded with 1099's which is causing extreme delays.  Pt will let landlord know, knb

## 2017-11-17 ENCOUNTER — Other Ambulatory Visit: Payer: Self-pay | Admitting: Nurse Practitioner

## 2017-11-17 DIAGNOSIS — E119 Type 2 diabetes mellitus without complications: Secondary | ICD-10-CM

## 2017-11-17 MED ORDER — BD PEN NEEDLE SHORT U/F 31G X 8 MM MISC: 1.0000 | Freq: Every day | 3 refills | Status: DC

## 2017-11-21 ENCOUNTER — Ambulatory Visit: Payer: Medicare HMO | Admitting: Dietician

## 2017-11-21 ENCOUNTER — Encounter: Payer: Self-pay | Admitting: Dietician

## 2017-11-21 NOTE — Progress Notes (Signed)
Patient cancelled his appointment for 11/21/17 due to being out of town. He stated he would call back later to reschedule.

## 2017-11-23 ENCOUNTER — Other Ambulatory Visit: Payer: Self-pay | Admitting: *Deleted

## 2017-11-23 ENCOUNTER — Ambulatory Visit: Payer: Medicare HMO | Admitting: Urology

## 2017-11-23 NOTE — Patient Outreach (Signed)
[facility] Care Management  11/23/2017  Philip [name] Nov 15, 1960 [id number]  Referral via Primary Care Provider [facility]; Reason-"patient needs home assessment for safety; recently had fall; requesting handicap toilet or elevated toilet seat, grab bars in bathroom; needs glasses."  Subjective/objective: Telephone call to patient who was advised of reason for call & of [facility] services.  HIPPA verification received from patient.  It is noted in chart that patient has [employer] Medicare & Medicaid insurance coverage.   Patient states main concern is getting diabetes under control. States he also is having frequent falls following episodes of dizziness.  States having difficulty affording foods such as fresh vegetables. States he does have food stamps but still does not have enough to get the foods he needs to eat.   Last noted "A1c" results in chart 9.1 -09/09/2017. Patient voices that he is currently attending Diabetes Classes once monthly in building [facility] to [facility].    States he manages his own medications & self administers his insulin. States he gets medications in bubble packing delivered to his home.  Voices he is checking blood sugar 4 times daily and gives himself Novolog using scale that he is suppose to use. Voices he does not give insulin if he has not eaten the proper amount of food. States if blood sugar drops he knows to drinks something sweet.   Patient voices he takes self to MD appointments. States he attends Primary care appointments every 3 months and when needed. Also [facility]. Patient voices current weight 400 lbs; height-6'3".  Assessment; Diabetes uncontrolled; last noted A1c-9.1. Taking Toujeo bid, Novolog -SS, Victoza & metformin. Patient is high falls risk-having dizziness with most recent falls. Has car & is able to drive self to appointments when he  has enough gas.  Unable to buy healthy foods due to lack of finances.  Patient agrees to [facility] services of [employer] care coordinator & Clinical social worker.   Plan: Send to care management assistant to assign to [facility] Care Coordinator for complex case management-DX Uncontrolled diabetes, high falls risk-needs in home assessment for further assessment/evaluation. Assign to Clinical Child psychotherapistocial worker for [employer] resources.   Colleen CanLinda Xiomar Crompton, RN BSN CCM Care Management Coordinator [facility] Care Management  [phone]

## 2017-11-24 ENCOUNTER — Ambulatory Visit (INDEPENDENT_AMBULATORY_CARE_PROVIDER_SITE_OTHER): Payer: Medicare HMO | Admitting: Podiatry

## 2017-11-24 ENCOUNTER — Encounter: Payer: Self-pay | Admitting: Podiatry

## 2017-11-24 DIAGNOSIS — M2012 Hallux valgus (acquired), left foot: Secondary | ICD-10-CM | POA: Diagnosis not present

## 2017-11-24 DIAGNOSIS — E1142 Type 2 diabetes mellitus with diabetic polyneuropathy: Secondary | ICD-10-CM

## 2017-11-24 DIAGNOSIS — M2011 Hallux valgus (acquired), right foot: Secondary | ICD-10-CM | POA: Diagnosis not present

## 2017-11-24 DIAGNOSIS — Z0100 Encounter for examination of eyes and vision without abnormal findings: Secondary | ICD-10-CM | POA: Diagnosis not present

## 2017-11-24 NOTE — Progress Notes (Signed)
This patient presents to the office for an evaluation of his diabetic feet.  He says that he has been diabetic with neuropathy.  Patient is diabetic and taking insulin and gabapentin.  He also takes metformin by mouth.  He requests diabetic shoes at this visit.   General Appearance  Alert, conversant and in no acute stress.  Vascular  Dorsalis pedis and posterior pulses are palpable  bilaterally.  Capillary return is within normal limits  bilaterally. Temperature is within normal limits  Bilaterally. Hair present on digits. Venous stasis is noted both legs.  Neurologic  Senn-Weinstein monofilament wire test diminished   bilaterally. Muscle power within normal limits bilaterally.  Nails Thick disfigured discolored nails with subungual debris bilaterally from hallux to fifth toes bilaterally. No evidence of bacterial infection or drainage bilaterally.  Orthopedic  No limitations of motion of motion feet bilaterally.  No crepitus or effusions noted.  HAV  B/L  Skin  normotropic skin with no porokeratosis noted bilaterally.  No signs of infections or ulcers noted.   Diabetic neuropathy   HAV  B/L.   IE  Patient was evaluated and he does qualify for diabetic shoes since the has DPN and HAV deformities both feet.  Patient is measured and diabetic shoes were packed.  He needs to get a letter from his doctor stating he is diabetic prior to receiving his shoes.   Helane GuntherGregory Vaibhav Fogleman DPM

## 2017-11-25 ENCOUNTER — Encounter: Payer: Self-pay | Admitting: *Deleted

## 2017-11-25 ENCOUNTER — Other Ambulatory Visit: Payer: Self-pay | Admitting: *Deleted

## 2017-11-25 ENCOUNTER — Telehealth: Payer: Self-pay

## 2017-11-25 ENCOUNTER — Encounter: Payer: Self-pay | Admitting: Nurse Practitioner

## 2017-11-25 NOTE — Patient Outreach (Signed)
[facility](THN) Care Management  11/25/2017  Philip ButteRichard Clegg [name] 1961-07-13 [id number]   Patient referred to this social worker by telephonic RNCM. Per referral, patient requesting handicap toilet or elevated toilet seat, grab bars in bathroom and glasses.  Phone call to patient's provider who confirmed food insecurities and transportation issues as patient's car often breaks down. Phone call to patient to schedule initial home visit. Patient did not answer, voicemail message left requesting a return call.   Adriana ReamsChrystal Lason Eveland, LCSW [facility] Care Management [phone]

## 2017-11-25 NOTE — Patient Outreach (Signed)
[facility]THN) Care Management  11/25/2017  Philip ButteRichard Swanton [name] 05/11/1961 [id number]   Voicemail received  [facility] Philip PurpuraKim [name] stating that she was able to reach patient. Co-visit scheduled for 12/01/17 at 1:00 pm to review community resources for DME and available food resources.    Adriana ReamsChrystal Leon Goodnow, LCSW [facility] Care Management [phone]

## 2017-11-25 NOTE — Patient Outreach (Addendum)
[facility](THN) Care Management  11/25/2017  Keitha Butteichard Delahoussaye II 01-May-1961 [id number]  Telephone assessment outreach #1  Referral received 2/14 [facility] Referral reason : patient needs home assessment for safety; recently had fall; requesting handicap toilet or elevated toilet seat, grab bars in bathroom; needs glasses."  Unsuccessful outreach call to patient, able to leave a HIPAA compliant message requesting a return call.  Plan Will await return call on today, if no response with schedule return call to patient in next business day.  1500 Addendum Patient returned from previous outreach, explained reason for call , HIPAA information verified .   Patient reports that his main concerns are related to preventing falls, and getting Diabetes under control . Patient discussed limits on healthier foods from food pantries, when  his foods run low at home. Patient checks his blood sugar 4 times a day, hasn't checked it today , because he didn't wake up until after 1 pm.  Patient discussed attending a nutrition call once a month, and that is helping some with knowing what foods to limit and include in diet, but he does not have the proper foods he needs to eat.   Patient discussed recent increase in falls, mostly occurring in the bathroom, he mentioned that he wants someone to come and evaluate to see what changes he needs to make to help prevent falls.   Assessment Diabetes uncontrolled, will benefit on continued education and support in managing diabetes.  Fall risk, will benefit from home safety evaluation and falls risk assessment.   Patient requested that I notify, Chrystal Land , LCSW that I was able to speak with him on today, as he has also received outreach call from her on today.   Patient agreeable to home visit with community care coordinator and LCSW for assessment of care needs.   Plan Will schedule home in the next  week for assessment, care planning and goal setting.  Will send MD involvement barrier letter.  Will send patient welcome letter.    Egbert GaribaldiKimberly Rake, RN, [facility] Care Management,Care Management Coordinator  [phone]- Mobile [phone]- Toll Free Main Office

## 2017-11-28 ENCOUNTER — Ambulatory Visit: Payer: Medicare HMO | Admitting: Podiatry

## 2017-11-28 ENCOUNTER — Ambulatory Visit: Payer: Self-pay | Admitting: *Deleted

## 2017-11-29 ENCOUNTER — Other Ambulatory Visit: Payer: Self-pay

## 2017-11-29 MED ORDER — TIOTROPIUM BROMIDE MONOHYDRATE 1.25 MCG/ACT IN AERS: 2.0000 | INHALATION_SPRAY | Freq: Every day | RESPIRATORY_TRACT | 5 refills | Status: DC

## 2017-11-30 NOTE — Telephone Encounter (Signed)
Open in error

## 2017-12-01 ENCOUNTER — Other Ambulatory Visit: Payer: Self-pay | Admitting: *Deleted

## 2017-12-01 ENCOUNTER — Ambulatory Visit: Payer: Self-pay | Admitting: *Deleted

## 2017-12-01 NOTE — Patient Outreach (Signed)
[facility](THN) Care Management  12/01/2017  Philip ButteRichard Ham [name] 03/02/1961 [id number]   In basket message received from the care management assistant stating that patient called to cancel home visit scheduled for today. This social worker will call to reschedule appointment within 2 weeks.   Adriana ReamsChrystal Land, LCSW [facility] Care Management [phone]

## 2017-12-01 NOTE — Patient Outreach (Addendum)
[facility](THN) Care Management  12/01/2017  Philip ButteRichard Noguez [name] 1961/10/08 [id number]   Telephone assessment   Successful outreach call to patient on today, to follow up , he cancelled our initial home visit on today, spoke with patient HIPAA verified.  Patient discussed he didn't feel like getting up to clean his house before our visit, today.  Patient discussed feeling in a ill mood on today due to chronic pain in back and legs, she states his Gabapentin has been decreased and thinks this is contributing to it as well as the weather on today. Patient reports pain level is 7 while resting in bed.   Patient states he has attempted to call office on today, offered to assist he is in agreement.   Discussed emotional state discussed mood being some better now, denies self harm thoughts.   Patient agreeable call in the next week to reschedule home visit.   Plan  Place call to Philip McardleLauren [name] office able to speak with nurse, Philip Rubensteinalita  to notify of patient concern regarding Gabapentin dose, she will notify PCP and follow up with patient.  Will schedule follow up call in the next week.  Philip GaribaldiKimberly Denicola, RN, [facility] Care Management,Care Management Coordinator  [phone]- Mobile [phone]- Toll Free Main Office

## 2017-12-02 ENCOUNTER — Telehealth: Payer: Self-pay

## 2017-12-02 DIAGNOSIS — E119 Type 2 diabetes mellitus without complications: Secondary | ICD-10-CM

## 2017-12-02 MED ORDER — GABAPENTIN 400 MG PO CAPS: 800.0000 mg | ORAL_CAPSULE | Freq: Four times a day (QID) | ORAL | 5 refills | Status: DC

## 2017-12-02 NOTE — Telephone Encounter (Signed)
I received a call from the patient social worker stating that the patient,  [name] cancelled his home visit yesterday, because he said he was in a lot of pain due to the decrease in his Gabapentin.

## 2017-12-02 NOTE — Telephone Encounter (Signed)
I have changed his prescription.  There was a mix up when changing pharmacies.  Pt called and is aware.   - There are no other medication errors noted by patient today.

## 2017-12-06 ENCOUNTER — Other Ambulatory Visit: Payer: Self-pay | Admitting: *Deleted

## 2017-12-06 ENCOUNTER — Encounter: Payer: Self-pay | Admitting: *Deleted

## 2017-12-06 NOTE — Patient Outreach (Signed)
[facility](THN) Care Management  12/06/2017  Philip [name] 1961/05/20 [id number]  Telephone assessment   Successful outreach call to patient HIPAA information verified . Patient reports he is hanging in there it gets tough sometimes. He discussed getting prescription for gabapentin straightened now he is awaiting delivery from [employer].  Patient discussed something his chronic back pain is worse on days when it is cloudy.  Patient discussed his goals of wanting to work on getting his diabetes under control, he struggles with food choices, some limitations on healthier foods, sometimes he cooks foods that he should not be eating. Patient discussed he has been in contact with his DSS worker regarding food stamps for this month hoping they will not be delayed, reports receiving email stating they be available between March 1-3.  Patient reports he want to feel better so he can get back to doing some of things he enjoys. Patient plans to reschedule visit at nutrition center to help with managing his diabetes. Patient has not checked his blood sugar on today, because he is just getting up, he is unable to recall yesterday's readings.   Patient discussed concern regarding frequent falls history, reports he does not use a cane at this time. Patient discussed wanting help looking into  getting a chair height commode in bathroom,states his weight will not support the raised commode seat they crack.   Assessment  Will benefit from home visit for fall safety evaluation and education, care coordination regarding diabetes.   Plan Will plan home visit in the next week.  Will collaborate with [facility] , LSCW regarding patient discussed home needs   [facility] CM Care Plan Problem One     Most Recent Value  Care Plan Problem One  Knowledge related to self care managment of diabetes as evidenced by Alc greater than 8   Role Documenting the Problem One  Care Management Coordinator  Care  Plan for Problem One  Active  [facility] Long Term Goal   Patient will be able to report reduction in Alc in the next 90 days   [facility]N Long Term Goal Start Date  12/06/17  Interventions for Problem One Long Term Goal  Advised patient regarding importance of managing blood sugar to reduce further medical condiitons, improve health   THN CM Short Term Goal #1   Over the next 30 days patient will report monitoring and keeping a record of blood sugars at least 3 times daily   THN CM Short Term Goal #1 Start Date  12/06/17  Interventions for Short Term Goal #1  Discussed importance of moniitoring blood sugars at home, verified he has access to all needed supplies and able to keep in reach.   THN CM Short Term Goal #2   Patient will reports rescheduling visit to nutrition class in the next 14 days   THN CM Short Term Goal #2 Start Date  12/06/17  Interventions for Short Term Goal #2  Discussed the importance of attending class for education support, reinforcment of diet changes to include daily,        Egbert GaribaldiKimberly Orsborn, RN, [facility] Care Management,Care Management Coordinator  [phone]- Mobile [phone]- Toll Free Main Office

## 2017-12-07 ENCOUNTER — Telehealth: Payer: Self-pay | Admitting: Dietician

## 2017-12-07 NOTE — Telephone Encounter (Signed)
Called patient to reschedule appointment from 11/21/17, which was cancelled when patient went out of town. Rescheduled for 12/14/17.

## 2017-12-08 DIAGNOSIS — I10 Essential (primary) hypertension: Secondary | ICD-10-CM | POA: Diagnosis not present

## 2017-12-08 DIAGNOSIS — J45909 Unspecified asthma, uncomplicated: Secondary | ICD-10-CM | POA: Diagnosis not present

## 2017-12-08 DIAGNOSIS — J9611 Chronic respiratory failure with hypoxia: Secondary | ICD-10-CM | POA: Diagnosis not present

## 2017-12-08 DIAGNOSIS — F3132 Bipolar disorder, current episode depressed, moderate: Secondary | ICD-10-CM | POA: Diagnosis not present

## 2017-12-13 ENCOUNTER — Ambulatory Visit: Payer: Medicare HMO | Admitting: Urology

## 2017-12-13 ENCOUNTER — Encounter: Payer: Self-pay | Admitting: Urology

## 2017-12-14 ENCOUNTER — Ambulatory Visit: Payer: Medicare HMO | Admitting: Dietician

## 2017-12-15 ENCOUNTER — Other Ambulatory Visit: Payer: Self-pay | Admitting: *Deleted

## 2017-12-15 ENCOUNTER — Telehealth: Payer: Self-pay

## 2017-12-15 NOTE — Patient Outreach (Signed)
[facility](THN) Care Management   12/16/2017  Philip [name] Jan 11, 1961 [id number]  Philip [name] is an 57 y.o. male   Initial home visit   Subjective:  Patient reports having tingling in hands while lying in the bed this morning relieved after getting up from bed.  Patient reports he wants to work on getting his diabetes under control, bringing A1c back down.  Objective:  BP 140/74 (BP Location: Left Arm, Patient Position: Sitting, Cuff Size: Large)   Pulse 68   Resp 20   Ht 1.905 m (6\' 3" )   Wt (!) 416 lb (188.7 kg)   SpO2 96%   BMI 52.00 kg/m  Review of Systems  Constitutional: Negative.   HENT: Negative.   Eyes: Negative.   Respiratory: Negative.   Cardiovascular: Positive for leg swelling.       Bilateral lower leg edema, right greater than left   Gastrointestinal: Negative.   Genitourinary: Negative.   Musculoskeletal: Negative.   Skin: Negative.   Neurological: Negative.   Endo/Heme/Allergies: Negative.   Psychiatric/Behavioral: Positive for depression. Negative for suicidal ideas.    Physical Exam  Constitutional: He is oriented to person, place, and time. He appears well-developed and well-nourished.  Cardiovascular: Normal rate, normal heart sounds and intact distal pulses.  Respiratory: Effort normal and breath sounds normal.  GI: Soft. Bowel sounds are normal.  Neurological: He is alert and oriented to person, place, and time.  Skin: Skin is warm and dry.     Psychiatric: He has a normal mood and affect. His behavior is normal. Judgment and thought content normal.    Encounter Medications:   Outpatient Encounter Medications as of 12/15/2017  Medication Sig  . albuterol (VENTOLIN HFA) 108 (90 Base) MCG/ACT inhaler INL 1 TO 2 PFS PO Q 4 TO 6 H PRF WHZ  . aspirin EC 81 MG tablet Take 81 mg by mouth daily.  Marland Kitchen. atorvastatin (LIPITOR) 10 MG tablet Take 1 tablet (10 mg total) by mouth daily.  . B-D ULTRAFINE III SHORT PEN 31G X 8 MM MISC  Inject 1 Device into the skin 5 (five) times daily.  . budesonide-formoterol (SYMBICORT) 160-4.5 MCG/ACT inhaler Inhale 2 puffs into the lungs 2 (two) times daily.  Marland Kitchen. buPROPion (WELLBUTRIN SR) 100 MG 12 hr tablet TK 2 TS PO BID  . Cholecalciferol (VITAMIN D PO) Take 1 capsule by mouth daily.  . citalopram (CELEXA) 40 MG tablet Take 40 mg by mouth daily.  . clonazePAM (KLONOPIN) 1 MG tablet TAKE 1 TABLET BY MOUTH THREE TIMES DAILY AS NEEDED FOR ANXIETY  . diclofenac (VOLTAREN) 75 MG EC tablet Take 1 tablet (75 mg total) by mouth 2 (two) times daily.  . diclofenac sodium (VOLTAREN) 1 % GEL Apply 4 g topically 4 (four) times daily.  . furosemide (LASIX) 40 MG tablet Take 1 tablet (40 mg total) by mouth daily.  Marland Kitchen. gabapentin (NEURONTIN) 400 MG capsule Take 2 capsules (800 mg total) by mouth 4 (four) times daily.  . Insulin Glargine (TOUJEO SOLOSTAR) 300 UNIT/ML SOPN Inject 62 Units into the skin 2 (two) times daily.  Marland Kitchen. levothyroxine (SYNTHROID, LEVOTHROID) 200 MCG tablet Take 1 tablet (200 mcg total) by mouth daily before breakfast.  . lisinopril (PRINIVIL,ZESTRIL) 40 MG tablet Take 1 tablet (40 mg total) by mouth daily.  . metFORMIN (GLUCOPHAGE-XR) 500 MG 24 hr tablet Take 2 tablets (1,000 mg total) by mouth 2 (two) times daily with a meal.  . Multiple Vitamin (MULTIVITAMIN WITH MINERALS) TABS  tablet Take 1 tablet by mouth daily.  . multivitamin-lutein (OCUVITE-LUTEIN) CAPS capsule Take 1 capsule by mouth daily.  Marland Kitchen NOVOLOG FLEXPEN 100 UNIT/ML FlexPen INJECT 20 UNITS UNDER THE SKIN TID BEFORE MEALS plus Sliding Scale correction up to 15 additional units per dose.  Marland Kitchen omeprazole (PRILOSEC) 40 MG capsule Take 1 capsule (40 mg total) by mouth 2 (two) times daily.  . potassium chloride SA (K-DUR,KLOR-CON) 20 MEQ tablet Take 1 tablet (20 mEq total) by mouth daily.  . Tiotropium Bromide Monohydrate (SPIRIVA RESPIMAT) 1.25 MCG/ACT AERS Inhale 2 puffs into the lungs daily.  Marland Kitchen VICTOZA 18 MG/3ML SOPN Inject 0.3  mLs (1.8 mg total) into the skin daily.  . ziprasidone (GEODON) 20 MG capsule Take 1 capsule (20 mg total) by mouth 2 (two) times daily with a meal.  . Testosterone (ANDROGEL PUMP) 20.25 MG/ACT (1.62%) GEL    No facility-administered encounter medications on file as of 12/15/2017.     Functional Status:   In your present state of health, do you have any difficulty performing the following activities: 11/25/2017 11/01/2017  Hearing? N Y  Comment - advised of hearing clinics  Vision? Y Y  Comment expecting new glasses on 3/1 needs new glasses  Difficulty concentrating or making decisions? [name]  Walking or climbing stairs? [name]  Comment has difficulty with steps going up  knee pain   Dressing or bathing? Y N  Comment partner helps  -  Doing errands, shopping? N N  Preparing Food and eating ? Y N  Comment partner and friend helps as needed -  Using the Toilet? N N  In the past six months, have you accidently leaked urine? N N  Do you have problems with loss of bowel control? N N  Managing your Medications? N N  Managing your Finances? N N  Housekeeping or managing your Housekeeping? Y N  Comment partner helps -    Fall/Depression Screening:    Fall Risk  11/25/2017 11/23/2017 11/01/2017  Falls in the past year? Yes Yes Yes  Number falls in past yr: - 2 or more 2 or more  Injury with Fall? Yes Yes No  Comment - scratches, bruises, muscle pain -  Risk Factor Category  High Fall Risk High Fall Risk High Fall Risk  Risk for fall due to : History of fall(s);Impaired balance/gait History of fall(s) -  Follow up - - Falls prevention discussed   PHQ 2/9 Scores 12/06/2017 11/23/2017 11/01/2017 10/31/2017 09/09/2017 07/05/2017  PHQ - 2 Score 4 1 2 2 2 5   PHQ- 9 Score 12 - 9 8 4 20     Assessment:  Initial home visit, patient friend Heron Sabins present.   Provided welcome packet and written consent signed.  Diabetes - meter reviewed, 30 day average 214 with noted only checked reading 6 times.  Patient reports continuing to take insulins, reports using Novolog at  meals times, not consistent with correction insulin due to not checking blood sugars. No low blood sugar events, able to state treatment plan.  Patient usually sleeps late, inconsistent regular meals, usually 2 meals a day, larger meal in the evening.When his food runs low he depends on food banks and usually more starch items available.   Drinks glucerna on some days . Patient to reschedule refresher class on nutrition, missed appointment this week, plans reschedule .  Had eye exam, completed in Feb, and has received new glasses, had podiatry visit in this month.  Fall Risk  Denies recent  fall, thinks new glasses have helped.  Patient ambulating independently in home safety evaluation, will benefit from bars around shower. Reports most falls have occurred in the bathroom area.  Patient has a very low toilet seat , his weight  is over the recommended raised toilet seat limit.  [facility] LCSW referral in place to assess . Medication - uses [facility] [employer] [facility] had delivery on today, reports taking pills as prescribed.  Depression /Bipolar  Reports being consistent with appointments at [facility].   Patient discussed upcoming appointment regarding low testosterone, with PCP and A1c recheck.   Patient discussed wearing oxygen 2 liters at times for shortness of breath , but not having a tank for travel, reports [employer] to deliver tanks today  he can use when away from home. Patient does not have pulse oximeter for monitoring oxygen at home, he will check into purchasing , reports he has looked in [place] OTC book.   Plan:  Reviewed welcome packet. Will discuss with Chrystal Land LCSW home assessment and patient requesting assistance regarding handicap toilet and concern regarding food supply at times.   RNCM will send PCP visit note RNCM will place return call to patient in the next 2 weeks to follow up on blood sugars and  rescheduling nutrition visit.   THN CM Care Plan Problem One     Most Recent Value  Care Plan Problem One  Knowledge related to self care managment of diabetes as evidenced by Alc greater than 8   Role Documenting the Problem One  Care Management Coordinator  Care Plan for Problem One  Active  [facility] Long Term Goal   Patient will be able to report reduction in Alc in the next 90 days   THN Long Term Goal Start Date  12/06/17  Interventions for Problem One Long Term Goal  Advised patient regarding importance of managing blood sugar to reduce further medical condiitons , improve health, Provided Diabetes booklet, reviewed how diabetes effects body systems,    THN CM Short Term Goal #1   Over the next 30 days patient will report monitoring and keeping a record of blood sugars at least 3 times daily   [facility]N CM Short Term Goal #1 Start Date  12/06/17  Interventions for Short Term Goal #1  Reinforced checking blood sugars at recommended by PCP, to be able to take insulin as prescribed. and keep a record of readings , provided and reviewed how to use [facility] calendar to record readings   [facility] CM Short Term Goal #2   Patient will reports rescheduling visit to nutrition class in the next 14 days   [facility] CM Short Term Goal #2 Start Date  12/15/17 [goal date reset ]  Interventions for Short Term Goal #2  Reinforced  attending class for education support, reinforcment of diet changes to include daily,   [facility]N CM Short Term Goal #3  Over the next 30 days patient will report increase in activity of walking daily   [facility]N CM Short Term Goal #3 Start Date  12/15/17  Interventions for Short Tern Goal #3  RNCM discussed benefits of exercise to help with controlling and lowering blood sugar,discussed walking in home , outside or pursuing use of silver sneakers        Donaldson Richter, RN, [facility] Care Management,Care Management Coordinator  [phone]- Mobile [phone]- Toll Free Main Office

## 2017-12-15 NOTE — Telephone Encounter (Signed)
Patient will need to have office visit.  May also need more information about oxygen necessity to include home oxygen saturation study.  He is asked patient what the oxygen tanks are going to be used for how many times he is requesting.  He previously talked with us about oxygen at night, but he may be wanting this for travel purposes.

## 2017-12-15 NOTE — Telephone Encounter (Signed)
The pt called requesting we send a order to [employer] for oxygen tanks.

## 2017-12-16 ENCOUNTER — Encounter: Payer: Self-pay | Admitting: *Deleted

## 2017-12-19 ENCOUNTER — Ambulatory Visit: Payer: Medicare HMO | Admitting: Nurse Practitioner

## 2017-12-19 ENCOUNTER — Other Ambulatory Visit: Payer: Self-pay | Admitting: *Deleted

## 2017-12-19 NOTE — Patient Outreach (Signed)
[facility]) Care Management  12/19/2017  Philip ButteRichard Matchett [name] 04-Feb-1961 [id number]   Patient referred to this social worker by the [facility] telephonic case manager to assist patient with community resources. Per patient,he is living in a trailer with no running water due to the well needing repair. Per patient, the part should come today. Patient states that he is having financial struggles, he receives $194.00 in food stamps but states that it is not enough to buy healthy foods regularly. This Child psychotherapistsocial worker suggested that he request assistance by the nutritionist during his next visit to help with healthier, affordable food choices.  Per patient his phone will be disconnected tomorrow due to inability to pay, however he does have a low income issued phone [phone].  Patient reports at least five times, few times in the shower and on his deck. Per patient, his toilet is  too low, it is not standard and contributes to  to his frequent falls. Patient would like  assistance with getting a higher commode as he is over the weight requirement for a raised toilet seat.  Per patient, his landlord is aware of the need for the commode, she will not pay for it but she will allow for it to be put in.  Plan: This social worker will research community resource to assist with a higher commode to avoid falls.   Adriana ReamsChrystal Bryella Diviney, LCSW [facility] Care Management [phone]

## 2017-12-22 ENCOUNTER — Encounter: Payer: Self-pay | Admitting: Dietician

## 2017-12-22 NOTE — Progress Notes (Signed)
Patient did not keep his rescheduled appointment for 12/14/17, and has not yet called back to reschedule again. Sent discharge letter to referring provider.

## 2017-12-25 ENCOUNTER — Other Ambulatory Visit: Payer: Self-pay | Admitting: Nurse Practitioner

## 2017-12-25 DIAGNOSIS — F319 Bipolar disorder, unspecified: Secondary | ICD-10-CM

## 2017-12-26 ENCOUNTER — Other Ambulatory Visit: Payer: Self-pay | Admitting: Nurse Practitioner

## 2017-12-26 DIAGNOSIS — K219 Gastro-esophageal reflux disease without esophagitis: Secondary | ICD-10-CM

## 2017-12-27 ENCOUNTER — Other Ambulatory Visit: Payer: Self-pay | Admitting: *Deleted

## 2017-12-27 NOTE — Patient Outreach (Signed)
[facility]) Care Management  12/27/2017  Philip ButteRichard Lacroix [name] December 26, 1960 [id number]   Phone call to patient to discuss need for a bariatric commode to avoid falls. Per patient, his water well was fixed and he now has running water. Patient was reminded of missed appointment with RD. Per patient, he did not know he had re-scheduled the appointment for that date and time and will re-schedule the appointment. The Bariatric commode discussed with patient who reports that  he has plenty room around the commode for it to fit.   Plan: This social work will follow up with patient regarding the Bariatric commode. Will discuss possible funding with the [facility] Giving Committee.   Adriana ReamsChrystal Land, LCSW [facility] Care Management [phone]

## 2017-12-29 ENCOUNTER — Ambulatory Visit: Payer: Self-pay | Admitting: *Deleted

## 2017-12-29 ENCOUNTER — Other Ambulatory Visit: Payer: Self-pay | Admitting: *Deleted

## 2017-12-29 NOTE — Patient Outreach (Signed)
[facility] Care Management  12/29/2017  Keitha ButteRichard Mimnaugh II 1961-08-27 [id number]  Telephone assessment  Successful follow up call to, HIPAA verified . Speaking with patient on his alternative phone and reception not good and he is running low on minutes, hoping to be able to get his usual phone in service once he gets paid. . Patient reports that he is just now getting up, at 4:30 PM  he took a cold medication on last night . He reports he is feeling better from cold symptoms, denies feeling as if he had a fever.   Patient discussed Diabetes States he is checking blood sugar at least 2 times a day, due to usually sleeping later. Reports reading have been in the 200 range,  Patient reports he missed his appointment with [facility] he is agreeable to rescheduling.   Social Patient discussed recently he was out of water at home for at least 4 days due to problem with part on well , but it has now been repaired.   Patient agreeable to follow up call in the next month.  Patient has PCP visit in the next month for A1c check.    Plan  RN CM has scheduled follow up call within the next  2 weeks to follow up on Diabetes self care management goals  and schedule follow up home visit. Reviewed with patient notifying MD of unresolved symptoms of cough, cold , temperature, increase in shortness of breath , sputum production.    Egbert GaribaldiKimberly Sheridan, RN, [facility] Care Management,Care Management Coordinator  [phone]- Mobile [phone]- Toll Free Main Office

## 2018-01-04 ENCOUNTER — Encounter: Payer: Self-pay | Admitting: Urology

## 2018-01-04 ENCOUNTER — Ambulatory Visit (INDEPENDENT_AMBULATORY_CARE_PROVIDER_SITE_OTHER): Payer: Medicare HMO | Admitting: Urology

## 2018-01-04 VITALS — BP 119/73 | HR 80 | Ht 74.0 in | Wt >= 6400 oz

## 2018-01-04 DIAGNOSIS — E291 Testicular hypofunction: Secondary | ICD-10-CM | POA: Diagnosis not present

## 2018-01-04 DIAGNOSIS — N401 Enlarged prostate with lower urinary tract symptoms: Secondary | ICD-10-CM

## 2018-01-04 DIAGNOSIS — N529 Male erectile dysfunction, unspecified: Secondary | ICD-10-CM

## 2018-01-05 LAB — PSA: Prostate Specific Ag, Serum: 0.7 ng/mL (ref 0.0–4.0)

## 2018-01-05 LAB — TESTOSTERONE: Testosterone: 177 ng/dL — ABNORMAL LOW (ref 264–916)

## 2018-01-05 MED ORDER — SILDENAFIL CITRATE 20 MG PO TABS: ORAL_TABLET | ORAL | 0 refills | Status: DC

## 2018-01-05 MED ORDER — TESTOSTERONE CYPIONATE 200 MG/ML IM SOLN: 200.0000 mg | INTRAMUSCULAR | 0 refills | Status: DC

## 2018-01-05 MED ORDER — TAMSULOSIN HCL 0.4 MG PO CAPS: 0.4000 mg | ORAL_CAPSULE | Freq: Every day | ORAL | 1 refills | Status: DC

## 2018-01-05 MED ORDER — "SYRINGE/NEEDLE (DISP) 20G X 1"" 3 ML MISC": 0 refills | Status: DC

## 2018-01-05 NOTE — Progress Notes (Signed)
01/04/2018 4:46 PM   Philip [name] 03-05-1961 [id number]  Referring provider: Galen Manila, NP [address] [place], [place] [address]  Chief Complaint  Patient presents with  . Hypogonadism    New Patient    HPI: Philip [name] is a 57 year old male seen in consultation at the request of Philip [name] for evaluation and treatment of hypogonadism.  He states he was diagnosed with hypogonadism back in 2008 and started on testosterone replacement at that time.  He was most recently followed by an endocrinologist, Dr. Tanja Port in [place], [place] and was receiving 200 mg every 2 weeks.  His symptoms were tiredness, fatigue, low libido and erectile dysfunction and he states the testosterone was effective.  He moved to this area in June and was started on AndroGel by his primary provider however he did not like the gels secondary to a rash he developed.  He discontinued testosterone replacement in June 2018 and presents here  requesting to restart injections.  He has both difficulty achieving and maintaining an erection.  His SHIM score was 5/25.  He has used Viagra in the past with moderate results.  Other organic risk factors for his ED include diabetes, hypertension, antihypertensive medications.  He also has bothersome lower urinary tract symptoms including frequency, weak stream, nocturia x3-5.  I PSS completed today was 22/35.  He has been on both tamsulosin and daily Cialis in the past.  The daily Cialis was effective however he has not been able to afford. He has no history of sleep apnea and states he has had 3- sleep studies.   PMH: Past Medical History:  Diagnosis Date  . Anxiety   . Asthma   . Bipolar 1 disorder (HCC)   . COPD (chronic obstructive pulmonary disease) (HCC)   . Depression   . Diabetes mellitus without complication (HCC)   . GERD (gastroesophageal reflux disease)   . Osteoarthritis   . Thyroid disease     Surgical History: Past Surgical  History:  Procedure Laterality Date  . CHOLECYSTECTOMY    . SHOULDER SURGERY Right   . UMBILICAL HERNIA REPAIR      Home Medications:  Allergies as of 01/04/2018      Reactions   Aripiprazole    Other reaction(s): Other (see comments) Unknown per patient   Duloxetine Hcl Other (See Comments)   unsure   Fenofibrate    Other reaction(s): Other (see comments) Muscle twitching   Lamotrigine Other (See Comments)   fever   Pregabalin Swelling, Other (See Comments)   Swelling and weight gain   Simvastatin    Other reaction(s): Other (see comments) Myalgia   Latex Rash   Valproic Acid Rash      Medication List        Accurate as of 01/04/18 11:59 PM. Always use your most recent med list.          aspirin EC 81 MG tablet Take 81 mg by mouth daily.   atorvastatin 10 MG tablet Commonly known as:  LIPITOR Take 1 tablet (10 mg total) by mouth daily.   B-D ULTRAFINE III SHORT PEN 31G X 8 MM Misc Generic drug:  Insulin Pen Needle Inject 1 Device into the skin 5 (five) times daily.   budesonide-formoterol 160-4.5 MCG/ACT inhaler Commonly known as:  SYMBICORT Inhale 2 puffs into the lungs 2 (two) times daily.   buPROPion 100 MG 12 hr tablet Commonly known as:  WELLBUTRIN SR TK 2 TS PO BID  citalopram 40 MG tablet Commonly known as:  CELEXA Take 40 mg by mouth daily.   clonazePAM 1 MG tablet Commonly known as:  KLONOPIN TAKE 1 TABLET BY MOUTH THREE TIMES DAILY AS NEEDED FOR ANXIETY   diclofenac 75 MG EC tablet Commonly known as:  VOLTAREN Take 1 tablet (75 mg total) by mouth 2 (two) times daily.   diclofenac sodium 1 % Gel Commonly known as:  VOLTAREN Apply 4 g topically 4 (four) times daily.   furosemide 40 MG tablet Commonly known as:  LASIX Take 1 tablet (40 mg total) by mouth daily.   gabapentin 400 MG capsule Commonly known as:  NEURONTIN Take 2 capsules (800 mg total) by mouth 4 (four) times daily.   Insulin Glargine 300 UNIT/ML Sopn Commonly known  as:  TOUJEO SOLOSTAR Inject 62 Units into the skin 2 (two) times daily.   levothyroxine 200 MCG tablet Commonly known as:  SYNTHROID, LEVOTHROID Take 1 tablet (200 mcg total) by mouth daily before breakfast.   lisinopril 40 MG tablet Commonly known as:  PRINIVIL,ZESTRIL Take 1 tablet (40 mg total) by mouth daily.   metFORMIN 500 MG 24 hr tablet Commonly known as:  GLUCOPHAGE-XR Take 2 tablets (1,000 mg total) by mouth 2 (two) times daily with a meal.   multivitamin with minerals Tabs tablet Take 1 tablet by mouth daily.   multivitamin-lutein Caps capsule Take 1 capsule by mouth daily.   NOVOLOG FLEXPEN 100 UNIT/ML FlexPen Generic drug:  insulin aspart INJECT 20 UNITS UNDER THE SKIN TID BEFORE MEALS plus Sliding Scale correction up to 15 additional units per dose.   omeprazole 40 MG capsule Commonly known as:  PRILOSEC Take 1 capsule (40 mg total) by mouth 2 (two) times daily.   omeprazole 40 MG capsule Commonly known as:  PRILOSEC TAKE 1 CAPSULE(40 MG) BY MOUTH TWICE DAILY   potassium chloride SA 20 MEQ tablet Commonly known as:  K-DUR,KLOR-CON Take 1 tablet (20 mEq total) by mouth daily.   Tiotropium Bromide Monohydrate 1.25 MCG/ACT Aers Commonly known as:  SPIRIVA RESPIMAT Inhale 2 puffs into the lungs daily.   VENTOLIN HFA 108 (90 Base) MCG/ACT inhaler Generic drug:  albuterol INL 1 TO 2 PFS PO Q 4 TO 6 H PRF WHZ   VICTOZA 18 MG/3ML Sopn Generic drug:  liraglutide Inject 0.3 mLs (1.8 mg total) into the skin daily.   VITAMIN D PO Take 1 capsule by mouth daily.   ziprasidone 20 MG capsule Commonly known as:  GEODON Take 1 capsule (20 mg total) by mouth 2 (two) times daily with a meal.       Allergies:  Allergies  Allergen Reactions  . Aripiprazole     Other reaction(s): Other (see comments) Unknown per patient  . Duloxetine Hcl Other (See Comments)    unsure  . Fenofibrate     Other reaction(s): Other (see comments) Muscle twitching  .  Lamotrigine Other (See Comments)    fever  . Pregabalin Swelling and Other (See Comments)    Swelling and weight gain  . Simvastatin     Other reaction(s): Other (see comments) Myalgia  . Latex Rash  . Valproic Acid Rash    Family History: Family History  Problem Relation Age of Onset  . Depression Mother   . Diabetes Mother   . Heart disease Mother   . Depression Father   . Lung cancer Father   . Diabetes Father   . Breast cancer Sister   . Anxiety disorder Sister   .  Depression Sister   . Stroke Neg Hx     Social History:  reports that he has never smoked. He has never used smokeless tobacco. He reports that he drinks alcohol. He reports that he does not use drugs.  ROS: UROLOGY Frequent Urination?: Yes Hard to postpone urination?: No Burning/pain with urination?: No Get up at night to urinate?: Yes Leakage of urine?: Yes Urine stream starts and stops?: Yes Trouble starting stream?: Yes Do you have to strain to urinate?: No Blood in urine?: No Urinary tract infection?: No Sexually transmitted disease?: No Injury to kidneys or bladder?: No Painful intercourse?: No Weak stream?: Yes Erection problems?: Yes Penile pain?: No  Gastrointestinal Nausea?: No Vomiting?: No Indigestion/heartburn?: No Diarrhea?: No Constipation?: No  Constitutional Fever: No Night sweats?: No Weight loss?: No Fatigue?: No  Skin Skin rash/lesions?: No Itching?: Yes  Eyes Blurred vision?: No Double vision?: No  Ears/Nose/Throat Sore throat?: No Sinus problems?: No  Hematologic/Lymphatic Swollen glands?: No Easy bruising?: No  Cardiovascular Leg swelling?: No Chest pain?: No  Respiratory Cough?: Yes Shortness of breath?: Yes  Endocrine Excessive thirst?: No  Musculoskeletal Back pain?: Yes Joint pain?: Yes  Neurological Headaches?: No Dizziness?: No  Psychologic Depression?: Yes Anxiety?: Yes  Physical Exam: BP 119/73   Pulse 80   Ht 6\' 2"   (1.88 m)   Wt (!) 415 lb (188.2 kg)   BMI 53.28 kg/m   Constitutional:  Alert and oriented, No acute distress. HEENT: [place] AT, moist mucus membranes.  Trachea midline, no masses. Cardiovascular: No clubbing, cyanosis, or edema. Respiratory: Normal respiratory effort, no increased work of breathing. GI: Abdomen is soft, nontender, nondistended, no abdominal masses GU: No CVA tenderness.  Penis retracted into suprapubic fat but not buried.  Testes descended bilaterally without masses or tenderness.  Prostate 30 g, smooth without nodules. Lymph: No cervical or inguinal lymphadenopathy. Skin: No rashes, bruises or suspicious lesions. Neurologic: Grossly intact, no focal deficits, moving all 4 extremities. Psychiatric: Normal mood and affect.   Assessment & Plan:   57 year old male with a long history of symptomatic hypogonadism.  He desires to restart testosterone injections.  A testosterone level will be checked prior to starting.  Will resume 200 mg every 2 weeks and he will follow-up in 6 weeks for symptom recheck and testosterone level.  Potential side effects of testosterone replacement were discussed including stimulation of benign prostatic growth with lower urinary tract symptoms; erythrocytosis; edema; gynecomastia; worsening sleep apnea; venous thromboembolism; testicular atrophy and infertility. Recent studies suggesting an increased incidence of heart attack and stroke in patients taking testosterone was discussed. He was informed there is conflicting evidence regarding the impact of testosterone therapy on cardiovascular risk. The theoretical risk of growth stimulation of an undetected prostate cancer was also discussed.  He was informed that current evidence does not provide any definitive answers regarding the risks of testosterone therapy on prostate cancer and cardiovascular disease. The need for periodic monitoring of his testosterone level, PSA, hematocrit and DRE was discussed.  He  would like a trial of generic sildenafil for his ED.  He also has moderate to severe lower urinary tract symptoms and Rx tamsulosin was sent to his pharmacy.   Return in about 6 weeks (around 02/15/2018) for Recheck, T level.  Riki AltesScott C Rose Hippler, MD  [facility] [address], [address] [place], [place] [address] [phone]

## 2018-01-06 DIAGNOSIS — I10 Essential (primary) hypertension: Secondary | ICD-10-CM | POA: Diagnosis not present

## 2018-01-06 DIAGNOSIS — J45909 Unspecified asthma, uncomplicated: Secondary | ICD-10-CM | POA: Diagnosis not present

## 2018-01-06 DIAGNOSIS — J9611 Chronic respiratory failure with hypoxia: Secondary | ICD-10-CM | POA: Diagnosis not present

## 2018-01-09 ENCOUNTER — Telehealth: Payer: Self-pay

## 2018-01-09 ENCOUNTER — Ambulatory Visit: Payer: Medicare HMO | Admitting: Nurse Practitioner

## 2018-01-09 ENCOUNTER — Encounter: Payer: Self-pay | Admitting: Urology

## 2018-01-09 DIAGNOSIS — N529 Male erectile dysfunction, unspecified: Secondary | ICD-10-CM | POA: Insufficient documentation

## 2018-01-09 DIAGNOSIS — E291 Testicular hypofunction: Secondary | ICD-10-CM | POA: Insufficient documentation

## 2018-01-09 DIAGNOSIS — N401 Enlarged prostate with lower urinary tract symptoms: Secondary | ICD-10-CM | POA: Insufficient documentation

## 2018-01-09 NOTE — Telephone Encounter (Signed)
-----   Message from Riki AltesScott C Stoioff, MD sent at 01/06/2018 12:27 PM EDT ----- Testosterone level was low at 177.  PSA was normal at 0.7.  The testosterone prescription was sent to his pharmacy.

## 2018-01-09 NOTE — Telephone Encounter (Signed)
Patient notified on vmail 

## 2018-01-10 ENCOUNTER — Other Ambulatory Visit: Payer: Self-pay | Admitting: *Deleted

## 2018-01-10 NOTE — Patient Outreach (Signed)
[facility] Care Management  01/10/2018  Philip [name] November 21, 1960 [id number]   Telephone assessment   Unsuccessful telephone outreach to patient,mobile number not available, unable to leave a message, calls party temporarily unavailable. Attempted call to patient alternate number, able to leave a HIPAA compatible message for return call.   Plan  Will plan return in the next week.    Egbert GaribaldiKimberly Krawiec, RN, [facility] Care Management,Care Management Coordinator  [phone]- Mobile [phone]- Toll Free Main Office

## 2018-01-11 ENCOUNTER — Encounter: Payer: Self-pay | Admitting: Nurse Practitioner

## 2018-01-11 ENCOUNTER — Ambulatory Visit (INDEPENDENT_AMBULATORY_CARE_PROVIDER_SITE_OTHER): Payer: Medicare HMO | Admitting: Nurse Practitioner

## 2018-01-11 ENCOUNTER — Other Ambulatory Visit: Payer: Self-pay

## 2018-01-11 VITALS — BP 150/77 | HR 80 | Temp 99.6°F | Resp 20 | Ht 74.0 in | Wt >= 6400 oz

## 2018-01-11 DIAGNOSIS — E119 Type 2 diabetes mellitus without complications: Secondary | ICD-10-CM

## 2018-01-11 DIAGNOSIS — J301 Allergic rhinitis due to pollen: Secondary | ICD-10-CM

## 2018-01-11 DIAGNOSIS — H65193 Other acute nonsuppurative otitis media, bilateral: Secondary | ICD-10-CM

## 2018-01-11 LAB — POCT GLYCOSYLATED HEMOGLOBIN (HGB A1C): Hemoglobin A1C: 8.3

## 2018-01-11 MED ORDER — LEVOCETIRIZINE DIHYDROCHLORIDE 5 MG PO TABS: 5.0000 mg | ORAL_TABLET | Freq: Every evening | ORAL | 5 refills | Status: DC

## 2018-01-11 MED ORDER — AMOXICILLIN-POT CLAVULANATE 875-125 MG PO TABS: 1.0000 | ORAL_TABLET | Freq: Two times a day (BID) | ORAL | 0 refills | Status: AC

## 2018-01-11 MED ORDER — FLUTICASONE PROPIONATE 50 MCG/ACT NA SUSP: 2.0000 | Freq: Every day | NASAL | 6 refills | Status: AC

## 2018-01-11 NOTE — Progress Notes (Signed)
Subjective:    Patient ID: Philip [name], male    DOB: Apr 05, 1961, 57 y.o.   MRN: [id number]  Philip [name] is a 57 y.o. male presenting on 01/11/2018 for Cough (chest congestion, nasal congestion  x 1 week )   HPI Cough Pt presents today with URI symptoms x 1 week.  Symptoms include chest congestion, nasal congestion, cough, and fatigue/malaise.  Pt reports fever, cold chills today which is worsened.  He also reports ear pressure, and pain with muffled hearing.  He has known sick contact with his partner.   - No OTC meds tried to date  Diabetes Pt presents today for follow up of Type 2 diabetes mellitus. He is checking CBG at home with a range of 160-214, outlier today on sick day 283  CBG log shows good response after lunch and dinner for current insulin amount.  High from breakfast to lunch. - Current diabetic medications include: Metformin XR 1,000 mg bid, Toujeo 62 units bid, Novolog 20 units with each meal plus sliding scale, Victoza daily. - He is not currently symptomatic.  - He denies polydipsia, polyphagia, polyuria, headaches, diaphoresis, shakiness, chills, pain, numbness or tingling in extremities and changes in vision.   - Clinical course has been improving gradually, but currently complicated by food insecurity and loss of food stamps. - He  reports no regular exercise routine. - His diet is moderate in salt, moderate in fat, and high in carbohydrates. - Breakfast is toast, bagel, or muffin for breakfast. - Still having spaghetti, high carb meals for lunch and dinner also. - Weight trend: increasing steadily  PREVENTION: Eye exam current (within one year): yes Foot exam current (within one year): yes  Lipid/ASCVD risk reduction - on statin: yes Kidney protection - on ace or arb: yes Recent Labs    09/09/17 0938 01/11/18 0955  HGBA1C 9.1 8.3   Social History   Tobacco Use  . Smoking status: Never Smoker  . Smokeless tobacco: Never Used  Substance Use  Topics  . Alcohol use: Yes    Comment: occasionally  . Drug use: No    Review of Systems Per HPI unless specifically indicated above     Objective:    BP (!) 150/77 (BP Location: Left Arm, Patient Position: Sitting, Cuff Size: Large)   Pulse 80   Temp 99.6 F (37.6 C) (Oral)   Resp 20   Ht 6\' 2"  (1.88 m)   Wt (!) 419 lb 4.8 oz (190.2 kg)   SpO2 98%   BMI 53.83 kg/m   Wt Readings from Last 3 Encounters:  01/11/18 (!) 419 lb 4.8 oz (190.2 kg)  01/04/18 (!) 415 lb (188.2 kg)  12/15/17 (!) 416 lb (188.7 kg)    Physical Exam  Constitutional: He is oriented to person, place, and time. He appears well-developed and well-nourished. No distress.  HENT:  Head: Normocephalic and atraumatic.  Right Ear: Hearing, tympanic membrane, external ear and ear canal normal. Tympanic membrane is not erythematous and not bulging.  Left Ear: Hearing, external ear and ear canal normal. Tympanic membrane is erythematous and bulging.  Nose: Mucosal edema and rhinorrhea present. Right sinus exhibits maxillary sinus tenderness. Right sinus exhibits no frontal sinus tenderness. Left sinus exhibits maxillary sinus tenderness. Left sinus exhibits no frontal sinus tenderness.  Mouth/Throat: Uvula is midline and mucous membranes are normal. Posterior oropharyngeal edema (cobblestoning) and posterior oropharyngeal erythema (mildly injected) present. Oropharyngeal exudate: clear secretions.  Neck: Normal range of motion. Neck supple. Carotid  bruit is not present.  Cardiovascular: Normal rate, regular rhythm, S1 normal, S2 normal, normal heart sounds and intact distal pulses.  Pulmonary/Chest: Effort normal and breath sounds normal. No respiratory distress.  Musculoskeletal: He exhibits no edema (pedal).  Lymphadenopathy:    He has cervical adenopathy.  Neurological: He is alert and oriented to person, place, and time.  Skin: Skin is warm and dry.  Psychiatric: He has a normal mood and affect. His behavior is  normal. Judgment and thought content normal.  Vitals reviewed.     Results for orders placed or performed in visit on 01/11/18  POCT glycosylated hemoglobin (Hb A1C)  Result Value Ref Range   Hemoglobin A1C 8.3       Assessment & Plan:   Problem List Items Addressed This Visit      Endocrine   Diabetes mellitus without complication (HCC) - Primary    Improved control with home CBG checks that are more consistently at goal and today's A1c reduced to 8.3% from last A1c 9.1 in clinic in November 2018.  Patient reports no hypoglycemic events. Patient is on very high dose Toujeo 62 units twice daily and NovoLog 20 units plus sliding scale insulin for meals and is more balanced basal bolus ratio.  Patient reports feeling better at this dose.  Insulin regimen unlikely contributing to reactive hyperglycemia at this time.  Meals are more consistent, but pt continues to have food insecurity.  Plan: 1.  Continue Toujeo to 62 units twice daily.  2.  Continue Lantus 20 units with meals plus correction scale insulin. -Current correction scale is:  blood sugar 120-150, take 4 units correction insulin in addition to your meal insulin blood sugar 151-200, take 6 units blood sugar 201-250, take 8 units blood sugar 251-300, take 10 units blood sugar 301-350, take 12 units blood sugar 351-400, take 14 units blood sugar > 400, take 14 units and call clinic. 3.  Continue Victoza and metformin without changes. 4.  Reviewed low glycemic diet.  Encouraged regular mealtimes.  If no full meal at least eat a small snack. 5.  Completed annual eye exam and received documentation. 6.  Follow-up in 3 months with blood sugar log.      Relevant Orders   POCT glycosylated hemoglobin (Hb A1C) (Completed)    Other Visit Diagnoses    Other non-recurrent acute nonsuppurative otitis media of both ears       Seasonal allergic rhinitis due to pollen       Relevant Medications   levocetirizine (XYZAL) 5 MG tablet    fluticasone (FLONASE) 50 MCG/ACT nasal spray    Consistent with bilateral AOM on exam without effusion or perforation, in setting of prior URI symptoms with multiple known sick contacts (URI).  No recent antibiotics. No recent known AOM. Currently afebrile, well-appearing and non-toxic, well hydrated on exam.  Plan: 1. START Augmentin 875-125 mg one tablet every 12 hours for 10 days.  Consistent with URI and secondary sinusitis with symptoms worsening over the past 7 days and initial symptoms of nasal congestion and sinus pressure over 2 weeks ago.  - While on antibiotic, take a probiotic OTC or from food. - Start anti-histamine levocetirizine 5 mg daily.  May use alternative antihistamine if coverage is a problem with Xyzal.  - Can use Flonase 2 sprays each nostril daily for up to 4-6 weeks if no epistaxis. - Start Mucinex-DM OTC for  7-10 days prn congestion 2. Supportive care with nasal saline, warm herbal tea with  honey, 3. Improve hydration 4. Tylenol / Motrin PRN fevers  5. Return criteria given    Meds ordered this encounter  Medications  . amoxicillin-clavulanate (AUGMENTIN) 875-125 MG tablet    Sig: Take 1 tablet by mouth 2 (two) times daily for 10 days.    Dispense:  20 tablet    Refill:  0    Order Specific Question:   Supervising Provider    Answer:   Smitty Cords [[id number]]  . levocetirizine (XYZAL) 5 MG tablet    Sig: Take 1 tablet (5 mg total) by mouth every evening.    Dispense:  30 tablet    Refill:  5    Order Specific Question:   Supervising Provider    Answer:   Smitty Cords [[id number]]  . fluticasone (FLONASE) 50 MCG/ACT nasal spray    Sig: Place 2 sprays into both nostrils daily.    Dispense:  16 g    Refill:  6    Order Specific Question:   Supervising Provider    Answer:   Smitty Cords [[id number]]    Follow up plan: Return in about 3 months (around 04/12/2018) for diabetes.  [name], DNP, AGPCNP-BC Adult Gerontology  Primary Care Nurse Practitioner [facility] 01/31/2018, 10:30 AM

## 2018-01-11 NOTE — Patient Instructions (Addendum)
Philip [name],   Thank you for coming in to clinic today.  1. CONTINUE your diabetes medications without changes. - CHECK several sugars 2 hours after breakfast. Then, call clinic with results for 1 week.  - take atorvastatin one day per week.  2. It sounds like you have an Upper Respiratory Bacterial infection.  You have an ear infection. Recommend good hand washing. - START taking Augmentin 875-125 mg one tablet twice daily.  Make sure to take all doses of your antibiotic. - While you are on an antibiotic, take a probiotic.  Antibiotics kill good and bad bacteria.  A probiotic helps to replace your good bacteria. Probiotic pills can be found over the counter.  One brand is Florastor, but you can use any brand you prefer.  You can also get good bacteria from foods.  These foods are yogurt, kefir, kombucha, and fresh, refrigerated and uncooked sauerkraut. - Drink plenty of fluids.  - Start anti-histamine levocetirizine 5 mg daily. - You may also use Flonase 2 sprays each nostril daily for up to 4-6 weeks  Other over the counter medications you may try, if needed for symptoms are: - If congestion is worse, start OTC Mucinex (or may try Mucinex-DM for cough) up to 7-10 days then stop - You may try over the counter Nasal Saline spray (Simply Saline, Ocean Spray) as needed to reduce congestion. - Start taking Tylenol extra strength 1 to 2 tablets every 6-8 hours for aches or fever/chills for next few days as needed.  Do not take more than 3,000 mg in 24 hours from all medicines.  You may also take ibuprofen 200-400mg  every 8 hours as needed.   - Drink warm herbal tea with honey for sore throat.   If symptoms are significantly worse with persistent fevers/chills despite tylenol/ibpurofen, nausea, vomiting unable to tolerate food/fluids or medicine, body aches, or shortness of breath, sinus pain pressure or worsening productive cough, then follow-up for re-evaluation, may seek more immediate care  at Urgent Care or the ED if you are more concerned that it is an emergency.  Please schedule a follow-up appointment with Wilhelmina McardleLauren Kaisyn Reinhold, AGNP. Return in about 3 months (around 04/12/2018) for diabetes.  If you have any other questions or concerns, please feel free to call the clinic or send a message through MyChart. You may also schedule an earlier appointment if necessary.  You will receive a survey after today's visit either digitally by e-mail or paper by [employer] mail. Your experiences and feedback matter to us.  Please respond so we know how we are doing as we provide care for you.   Wilhelmina McardleLauren Amberly Livas, DNP, AGNP-BC Adult Gerontology Nurse Practitioner [facility], [facility]

## 2018-01-12 DIAGNOSIS — F3132 Bipolar disorder, current episode depressed, moderate: Secondary | ICD-10-CM | POA: Diagnosis not present

## 2018-01-18 ENCOUNTER — Other Ambulatory Visit: Payer: Self-pay

## 2018-01-18 DIAGNOSIS — E785 Hyperlipidemia, unspecified: Secondary | ICD-10-CM

## 2018-01-18 DIAGNOSIS — E119 Type 2 diabetes mellitus without complications: Secondary | ICD-10-CM

## 2018-01-18 DIAGNOSIS — E1169 Type 2 diabetes mellitus with other specified complication: Secondary | ICD-10-CM

## 2018-01-18 DIAGNOSIS — F319 Bipolar disorder, unspecified: Secondary | ICD-10-CM

## 2018-01-18 DIAGNOSIS — K219 Gastro-esophageal reflux disease without esophagitis: Secondary | ICD-10-CM

## 2018-01-18 DIAGNOSIS — E039 Hypothyroidism, unspecified: Secondary | ICD-10-CM

## 2018-01-18 MED ORDER — FUROSEMIDE 40 MG PO TABS: 40.0000 mg | ORAL_TABLET | Freq: Every day | ORAL | 3 refills | Status: DC

## 2018-01-18 MED ORDER — METFORMIN HCL ER 500 MG PO TB24: 1000.0000 mg | ORAL_TABLET | Freq: Two times a day (BID) | ORAL | 2 refills | Status: DC

## 2018-01-18 MED ORDER — LEVOTHYROXINE SODIUM 200 MCG PO TABS: 200.0000 ug | ORAL_TABLET | Freq: Every day | ORAL | 5 refills | Status: DC

## 2018-01-18 MED ORDER — ZIPRASIDONE HCL 20 MG PO CAPS: 20.0000 mg | ORAL_CAPSULE | Freq: Two times a day (BID) | ORAL | 1 refills | Status: DC

## 2018-01-18 MED ORDER — TIOTROPIUM BROMIDE MONOHYDRATE 1.25 MCG/ACT IN AERS: 2.0000 | INHALATION_SPRAY | Freq: Every day | RESPIRATORY_TRACT | 5 refills | Status: DC

## 2018-01-18 MED ORDER — GABAPENTIN 400 MG PO CAPS: 800.0000 mg | ORAL_CAPSULE | Freq: Four times a day (QID) | ORAL | 5 refills | Status: DC

## 2018-01-18 MED ORDER — OMEPRAZOLE 40 MG PO CPDR: 40.0000 mg | DELAYED_RELEASE_CAPSULE | Freq: Two times a day (BID) | ORAL | 5 refills | Status: DC

## 2018-01-18 MED ORDER — ATORVASTATIN CALCIUM 10 MG PO TABS: 10.0000 mg | ORAL_TABLET | Freq: Every day | ORAL | 5 refills | Status: DC

## 2018-01-18 MED ORDER — INSULIN GLARGINE 300 UNIT/ML ~~LOC~~ SOPN: 62.0000 [IU] | PEN_INJECTOR | Freq: Two times a day (BID) | SUBCUTANEOUS | 5 refills | Status: DC

## 2018-01-18 MED ORDER — LISINOPRIL 40 MG PO TABS: 40.0000 mg | ORAL_TABLET | Freq: Every day | ORAL | 2 refills | Status: DC

## 2018-01-18 MED ORDER — VICTOZA 18 MG/3ML ~~LOC~~ SOPN: 1.8000 mg | PEN_INJECTOR | Freq: Every day | SUBCUTANEOUS | 2 refills | Status: DC

## 2018-01-18 NOTE — Telephone Encounter (Signed)
The pt call complaining that the [facility] pharmacy have messed up his prescription once again. He's been out of his bp & insulin for over a week. He is requesting that you [place] [facility] [place].

## 2018-01-19 ENCOUNTER — Telehealth: Payer: Self-pay

## 2018-01-19 ENCOUNTER — Other Ambulatory Visit: Payer: Self-pay | Admitting: Nurse Practitioner

## 2018-01-19 DIAGNOSIS — M159 Polyosteoarthritis, unspecified: Secondary | ICD-10-CM

## 2018-01-19 DIAGNOSIS — M150 Primary generalized (osteo)arthritis: Principal | ICD-10-CM

## 2018-01-19 NOTE — Telephone Encounter (Signed)
Angelica from [facility] called stating they sent out the pt medication and received confirmations that medication was delivered.  But pt keeps telling [facility] that he never received the medications. One of the medications is testosterone cypionate. [facility] wanted to know if you wanted to refill the testosterone early. Please advise.    [facility]- [phone].

## 2018-01-20 ENCOUNTER — Other Ambulatory Visit: Payer: Self-pay | Admitting: *Deleted

## 2018-01-20 NOTE — Patient Outreach (Signed)
[facility](THN) Care Management  01/20/2018  Philip ButteRichard Keziah [name] [birth year] [id number]   Referral received 2/14 Ref[facility] : [facility] Referral reason : patient needs home assessment for safety; recently had fall; requesting handicap toilet or elevated toilet seat, grab bars in bathroom; needs glasses."  57 year old male with PMHx of Diabetes,Asthma, BPH, bipolar.     Outreach call to patient , HIPAA verified. Patient discussed feeling better, he states it is a thunderstorm going on in his area and requesedt a brief conversation.  Discussed scheduling follow up routine home visit and he is agreeable. Unable to address care plan goals at this conversation   Plan Will plan home visit in the next week.    Egbert GaribaldiKimberly Spargo, RN, [facility] Care Management,Care Management Coordinator  [phone]- Mobile [phone]- Toll Free Main Office

## 2018-01-22 NOTE — Telephone Encounter (Signed)
If the pharmacy has verification that the prescription was delivered I do not feel comfortable refilling.

## 2018-01-23 NOTE — Telephone Encounter (Signed)
LMOM for [facility] requesting a return call.

## 2018-01-24 ENCOUNTER — Other Ambulatory Visit: Payer: Self-pay | Admitting: *Deleted

## 2018-01-24 NOTE — Patient Outreach (Addendum)
[facility]) Care Management   01/24/2018  Philip [name] 11/11/1960 [id number]  Philip [name] is an 57 y.o. male  Subjective:  Patient reports feeling better after recent episode of sinus, and cold symptoms.  Patient discussed he is checking blood sugar but not always writing them down  Patient discussed using [employer] OTC benefit of $300 quarterly and receiving order today, items including  vitamins, first aid kit .  Objective:  BP 140/90 (BP Location: Right Arm, Patient Position: Sitting, Cuff Size: Large)   Pulse 79   Resp 20   SpO2 97%  Review of Systems  Constitutional: Negative.   HENT: Negative.   Eyes: Negative.   Respiratory: Positive for shortness of breath.   Cardiovascular: Negative.   Gastrointestinal: Negative.   Genitourinary: Negative.   Musculoskeletal: Positive for back pain and joint pain.  Skin: Negative.   Neurological: Negative.   Endo/Heme/Allergies: Negative.   Psychiatric/Behavioral: Positive for depression.    Physical Exam  Constitutional: He is oriented to person, place, and time. He appears well-developed and well-nourished.  Cardiovascular: Normal rate and normal heart sounds.  Respiratory: Effort normal and breath sounds normal.  GI: Soft.  Neurological: He is alert and oriented to person, place, and time.  Skin: Skin is warm and dry.  Psychiatric: He has a normal mood and affect. His behavior is normal. Judgment and thought content normal.    Encounter Medications:   Outpatient Encounter Medications as of 01/24/2018  Medication Sig  . albuterol (VENTOLIN HFA) 108 (90 Base) MCG/ACT inhaler INL 1 TO 2 PFS PO Q 4 TO 6 H PRF WHZ  . aspirin EC 81 MG tablet Take 81 mg by mouth daily.  Marland Kitchen atorvastatin (LIPITOR) 10 MG tablet Take 1 tablet (10 mg total) by mouth daily.  . B-D ULTRAFINE III SHORT PEN 31G X 8 MM MISC Inject 1 Device into the skin 5 (five) times daily.  . budesonide-formoterol (SYMBICORT) 160-4.5 MCG/ACT  inhaler Inhale 2 puffs into the lungs 2 (two) times daily.  Marland Kitchen buPROPion (WELLBUTRIN SR) 100 MG 12 hr tablet TK 2 TS PO BID  . Cholecalciferol (VITAMIN D PO) Take 1 capsule by mouth daily.  . citalopram (CELEXA) 40 MG tablet Take 40 mg by mouth daily.  . clonazePAM (KLONOPIN) 1 MG tablet TAKE 1 TABLET BY MOUTH THREE TIMES DAILY AS NEEDED FOR ANXIETY  . diclofenac (VOLTAREN) 75 MG EC tablet Take 1 tablet (75 mg total) by mouth 2 (two) times daily.  . diclofenac sodium (VOLTAREN) 1 % GEL Apply 4 g topically 4 (four) times daily.  . fluticasone (FLONASE) 50 MCG/ACT nasal spray Place 2 sprays into both nostrils daily.  . furosemide (LASIX) 40 MG tablet Take 1 tablet (40 mg total) by mouth daily.  Marland Kitchen gabapentin (NEURONTIN) 400 MG capsule Take 2 capsules (800 mg total) by mouth 4 (four) times daily.  . Insulin Glargine (TOUJEO SOLOSTAR) 300 UNIT/ML SOPN Inject 62 Units into the skin 2 (two) times daily.  Marland Kitchen levocetirizine (XYZAL) 5 MG tablet Take 1 tablet (5 mg total) by mouth every evening.  Marland Kitchen levothyroxine (SYNTHROID, LEVOTHROID) 200 MCG tablet Take 1 tablet (200 mcg total) by mouth daily before breakfast.  . lisinopril (PRINIVIL,ZESTRIL) 40 MG tablet Take 1 tablet (40 mg total) by mouth daily.  . metFORMIN (GLUCOPHAGE-XR) 500 MG 24 hr tablet Take 2 tablets (1,000 mg total) by mouth 2 (two) times daily with a meal.  . Multiple Vitamin (MULTIVITAMIN WITH MINERALS) TABS tablet Take 1 tablet  by mouth daily.  . multivitamin-lutein (OCUVITE-LUTEIN) CAPS capsule Take 1 capsule by mouth daily.  Marland Kitchen NOVOLOG FLEXPEN 100 UNIT/ML FlexPen INJECT 20 UNITS UNDER THE SKIN TID BEFORE MEALS plus Sliding Scale correction up to 15 additional units per dose.  Marland Kitchen omeprazole (PRILOSEC) 40 MG capsule TAKE 1 CAPSULE(40 MG) BY MOUTH TWICE DAILY (Patient not taking: Reported on 01/11/2018)  . omeprazole (PRILOSEC) 40 MG capsule Take 1 capsule (40 mg total) by mouth 2 (two) times daily.  . potassium chloride SA (K-DUR,KLOR-CON) 20 MEQ  tablet Take 1 tablet (20 mEq total) by mouth daily.  . sildenafil (REVATIO) 20 MG tablet 2-5 tabs 1 hour prior to intercourse  . SYRINGE-NEEDLE, DISP, 3 ML (SAFETY SYRINGES/NEEDLE) 20G X 1" 3 ML MISC 1 mL as directed every 2 weeks  . tamsulosin (FLOMAX) 0.4 MG CAPS capsule Take 1 capsule (0.4 mg total) by mouth daily. (Patient not taking: Reported on 01/11/2018)  . testosterone cypionate (DEPOTESTOSTERONE CYPIONATE) 200 MG/ML injection Inject 1 mL (200 mg total) into the muscle every 14 (fourteen) days. (Patient not taking: Reported on 01/11/2018)  . Tiotropium Bromide Monohydrate (SPIRIVA RESPIMAT) 1.25 MCG/ACT AERS Inhale 2 puffs into the lungs daily.  Marland Kitchen VICTOZA 18 MG/3ML SOPN Inject 0.3 mLs (1.8 mg total) into the skin daily.  . ziprasidone (GEODON) 20 MG capsule Take 1 capsule (20 mg total) by mouth 2 (two) times daily with a meal.   No facility-administered encounter medications on file as of 01/24/2018.     Functional Status:   In your present state of health, do you have any difficulty performing the following activities: 11/25/2017 11/01/2017  Hearing? N Y  Comment - advised of hearing clinics  Vision? Y Y  Comment expecting new glasses on 3/1 needs new glasses  Difficulty concentrating or making decisions? [name]  Walking or climbing stairs? [name]  Comment has difficulty with steps going up  knee pain   Dressing or bathing? Y N  Comment partner helps  -  Doing errands, shopping? N N  Preparing Food and eating ? Y N  Comment partner and friend helps as needed -  Using the Toilet? N N  In the past six months, have you accidently leaked urine? N N  Do you have problems with loss of bowel control? N N  Managing your Medications? N N  Managing your Finances? N N  Housekeeping or managing your Housekeeping? Y N  Comment partner helps -    Fall/Depression Screening:    Fall Risk  11/25/2017 11/23/2017 11/01/2017  Falls in the past year? Yes Yes Yes  Number falls in past yr: - 2 or more 2 or  more  Injury with Fall? Yes Yes No  Comment - scratches, bruises, muscle pain -  Risk Factor Category  High Fall Risk High Fall Risk High Fall Risk  Risk for fall due to : History of fall(s);Impaired balance/gait History of fall(s) -  Follow up - - Falls prevention discussed   PHQ 2/9 Scores 12/06/2017 11/23/2017 11/01/2017 10/31/2017 09/09/2017 07/05/2017  PHQ - 2 Score '4 1 2 2 2 5  '$ PHQ- 9 Score 12 - '9 8 4 20    '$ Assessment:  Routine home visit  Diabetes 30 day average 214 (19 readings) Today's reading 240  Checking blood sugar 2 to 3 times a day.  Not interested in attending nutrition class Patient focusing on being more active in the next months. Denies low blood sugar episodes , has glucose tablets on hand, and  able to state symptoms of hypoglycemia and how to treat , reinforced recheck after 15 minutes.  Needs continued education and support on Diabetes management .   Falls-  No recent falls, continued complaint of back discomfort since fall.   Medication  Patient discussed returning to getting [facility] at [facility] and not using [facility]  Reports medication prescription co payments are zero now recieves extra help with medications , reports taking medication as prescribed.     Plan:  Will plan follow up visit in the next month. Scheduled EMMI video on diabetes nutrition and meal planning .  Provided EMMI handout on ABC';s of Diabetes   [facility] CM Care Plan Problem One     Most Recent Value  Care Plan Problem One  Knowledge related to self care managment of diabetes as evidenced by Alc greater than 8   Role Documenting the Problem One  Care Management [place] for Problem One  Active  [facility] Long Term Goal   Patient will be able to report reduction in Alc in the next 90 days   [facility] Long Term Goal Start Date  12/06/17  Interventions for Problem One Long Term Goal  Home visit, Reinforced notifying MD of blood sugar reading consistently over 200 range. Reinforced  continuing to take medication as prescribed,,rotating insulin doses   [facility] CM Short Term Goal #1   Over the next 30 days patient will report monitoring and keeping a record of blood sugars at least 3 times daily   [facility] CM Short Term Goal #1 Start Date  01/24/18  [facility] CM Short Term Goal #1 Met Date  -- [goal restarted ]  Interventions for Short Term Goal #1  RN again reviewed patient meter and log of blood sugar readings  reinforced importance of continuing to monitor readings identifying when reading are higher to notify MD to help with treatment plan   [facility] CM Short Term Goal #2   Patient will reports rescheduling visit to nutrition class and attending in the next 20 days  [goal restated ]  [facility] CM Short Term Goal #2 Start Date  12/29/17 [goal date reset ]  [facility] CM Short Term Goal #3  Over the next 30 days patient will report increased walking to at least 15 minutes 3 days a week    [goal restated ]  [facility] CM Short Term Goal #3 Start Date  01/24/18 Barrie Folk restated ]  Interventions for Short Tern Goal #3  Discussed with patient how walking/exercise helps with lowering blood sugar   [facility] CM Short Term Goal #4  Patient will report increase knowledge of meal planning choices in the next 30 days   [facility] CM Short Term Goal #4 Start Date  01/24/18  Interventions for Short Term Goal #4  RN reviewed education on plate method meal planning from [facility] Diabetes already provided in home, Scheduled for patient to watch EMMI video on nutrition and meal planning.       Joylene Draft, RN, [place] Management Coordinator  [phone] Mobile [phone]- Toll Free Main Office

## 2018-01-24 NOTE — Telephone Encounter (Signed)
Spoke with [facility] and made aware not to refill testosterone. [facility] voiced understanding.

## 2018-01-25 ENCOUNTER — Encounter: Payer: Self-pay | Admitting: *Deleted

## 2018-01-30 ENCOUNTER — Other Ambulatory Visit: Payer: Self-pay | Admitting: *Deleted

## 2018-01-30 NOTE — Patient Outreach (Signed)
[facility]N) Care Management  01/30/2018  Keitha ButteRichard Raker II 15-Aug-1961 [id number]   Phone call to patient's provider's office to request referral to [facility] at the request of patient-[facility].   Plan: This Child psychotherapistsocial worker will follow up with patient to ensure that initial appointment is made.    Adriana ReamsChrystal Emberley Kral, LCSW [facility] Care Management [phone]

## 2018-01-30 NOTE — Patient Outreach (Signed)
[facility](THN) Care Management  01/30/2018  Keitha ButteRichard Popiel II 07/23/1961 [id number]   Phone call to patient to follow up on patient's DME needs. Patient did not answer. Voicemail message left for a return call.    Adriana ReamsChrystal Land, LCSW [facility] Care Management [phone]

## 2018-01-30 NOTE — Patient Outreach (Signed)
[facility]N) Care Management  01/30/2018  Philip [name] 1961-08-27 [id number]  Return phone call from patient to follow up on DME needs. Patient reiterates that he needs a bariatric toilet seat that will support his weight.  Financial Assessment form completed, patient to be presented to the [facility] giving committee for financial assistance to avoid falls in the bathroom.   Patient continues to report food insecurities and states that receives food stamps, $153.00 per month(which has decreased from $192.00) and also receives food from [employer] and the [employer] program monthly to supplement on a monthly choices.  Patient discussed having a recent death in the family. Supportive counseling provided as well as encouragement to follow up with his mental health providers through [facility]. Back up plan for transportation is logisticare transportation through [facility] [employer], however patient reluctant as he likes to drive himself to appointments due to the wait.   Plan: This Child psychotherapistsocial worker will discuss DME with [facility] giving [facility].    Adriana ReamsChrystal Land, LCSW [facility] Care Management [phone]

## 2018-01-31 ENCOUNTER — Other Ambulatory Visit: Payer: Self-pay | Admitting: Nurse Practitioner

## 2018-01-31 ENCOUNTER — Encounter: Payer: Self-pay | Admitting: Nurse Practitioner

## 2018-01-31 DIAGNOSIS — F319 Bipolar disorder, unspecified: Secondary | ICD-10-CM

## 2018-01-31 NOTE — Assessment & Plan Note (Addendum)
Improved control with home CBG checks that are more consistently at goal and today's A1c reduced to 8.3% from last A1c 9.1 in clinic in November 2018.  Patient reports no hypoglycemic events. Patient is on very high dose Toujeo 62 units twice daily and NovoLog 20 units plus sliding scale insulin for meals and is more balanced basal bolus ratio.  Patient reports feeling better at this dose.  Insulin regimen unlikely contributing to reactive hyperglycemia at this time.  Meals are more consistent, but pt continues to have food insecurity.  Plan: 1.  Continue Toujeo to 62 units twice daily.  2.  Continue Lantus 20 units with meals plus correction scale insulin. -Current correction scale is:  blood sugar 120-150, take 4 units correction insulin in addition to your meal insulin blood sugar 151-200, take 6 units blood sugar 201-250, take 8 units blood sugar 251-300, take 10 units blood sugar 301-350, take 12 units blood sugar 351-400, take 14 units blood sugar > 400, take 14 units and call clinic. 3.  Continue Victoza and metformin without changes. 4.  Reviewed low glycemic diet.  Encouraged regular mealtimes.  If no full meal at least eat a small snack. 5.  Completed annual eye exam and received documentation. 6.  Follow-up in 3 months with blood sugar log.

## 2018-02-01 ENCOUNTER — Telehealth: Payer: Self-pay | Admitting: Nurse Practitioner

## 2018-02-01 ENCOUNTER — Ambulatory Visit: Payer: Self-pay | Admitting: *Deleted

## 2018-02-01 ENCOUNTER — Other Ambulatory Visit: Payer: Self-pay | Admitting: *Deleted

## 2018-02-01 NOTE — Telephone Encounter (Signed)
Pt asked about getting help at home [phone]

## 2018-02-01 NOTE — Patient Outreach (Signed)
[facility]N) Care Management  02/01/2018  Keitha ButteRichard Bearden II 09/07/1961 [id number]  Incoming call from patient, stating I am not sure how you can help me but our Landlord may not renew our lease that is up in July. Patient discussed he was hopeful that they would be able to stay there for many years. He further discussed that he has been on time with rent, paying pet deposit. Patient discussed Herschel SenegalLandlord will  meet with them on Saturday to discuss the plan.  Patient further discussed that refrigerator is not working and he has called Landlord to notify and she is sending someone out to repair it.  Patient discussed this stress of not knowing whether he will be able stay in home is making his blood sugar run higher, he states he got one reading of 400.  Reinforced with patient importance of continuing to take insulin as prescribed, to prevent worsening of his medical condition and to notify PCP of consistent elevated blood sugars over 200 range. Patient denies symptoms of increased shortness of breath , increased thirst.  Emotional support to patient about the situation, discussed Landlord has not given them a negative decision yet and encouraged not to focus on that.  Plan Will discuss case and patient concern  with [facility] LCSW Chrystal Land and follow up call to patient in the next 2 days.  Reinforced to notify MD of continued elevated blood sugars and symptoms of hyperglycemia review.   Egbert GaribaldiKimberly Kuechle, RN, [facility] Care Management,Care Management Coordinator  [phone] Mobile [phone] Toll Free Main Office

## 2018-02-02 NOTE — Telephone Encounter (Signed)
The pt stop by the office today really upset and crying stating he needs some help. His landlord just informed them they have to be out of the house by May 3rd. He states he have no where else to go and doesn't know what to do. He states he spoke with [employer]rystal Land from [facility] and she is suppose to f/u with them.

## 2018-02-02 NOTE — Telephone Encounter (Signed)
Administration office on prior note is for [employer]

## 2018-02-02 NOTE — Telephone Encounter (Signed)
[facility] [address]  [place], [place], [place] [id number]  [phone]   Administration Office [phone] Open Monday - Friday, 8am - 5pm [employer]: 711        TDD/TTY: [phone]         Voice: [phone]  [facility] - Social Worker for emergency placement to avoid shelter 2/2 medical conditions.  Chrystal Land should be able to offer some other options if there are any.

## 2018-02-03 ENCOUNTER — Other Ambulatory Visit: Payer: Self-pay | Admitting: Licensed Clinical Social Worker

## 2018-02-03 NOTE — Telephone Encounter (Signed)
Called [facility] social worker.  Chrystal Land out of office, but will be provided information about Aragon's need for housing and urgency 2/2 suicidal ideation.  I called Orlan's phone numbers and His significant other Heron SabinsEric Cline without answers on any of them.  Left message encouraging Corby to seek help via 9-1-1 if he had any further thoughts about carrying out his suicide.    Will resume work on this problem Monday 4/28.

## 2018-02-03 NOTE — Patient Outreach (Addendum)
[facility]N) Care Management  02/03/2018  Philip ButteRichard Gaw [name] 01-23-1961 [id number]  Assessment- [facility] CSW is currently providing coverage for [facility] CSW Chrystal Land who is currently out of the office today. [facility] CSW receiving incoming call from patient's PCP Friday afternoon on 02/03/18. Patient is in need of housing resources as he may have to be evicted from his residence on 02/10/18. Patient informed PCP that he would experience suicidal ideations if were to get evicted. No current plan, suicidial or homicidial ideations at this time but this may change if patient is not able to gain safe and stable housing. [facility] CSW unable to find Housing Resources for that area under [facility] Care Management I-drive folder. TH[facility] informed PCP that she can at least email her [place] [facility] resources as patient is willing to relocate to another [place]. PCP appreciative of resource information. [facility] CSW sent secure email to PCP with requested housing resources. [facility] CSW will update [facility] CSW Chrsytal Land to follow up with patient on Monday 02/06/18 per PCP's request.  Plan-[facility]N CSW will update [facility] CSW [employer]rystal Land.  Dickie LaBrooke Margert Edsall, BSW, MSW, LCSW [facility] Care Management [email or web address] Phone: [phone] Fax: [phone]

## 2018-02-03 NOTE — Telephone Encounter (Signed)
I just spoke w/ the pt and gave him all the resource information. [name] was very upset again stating when he moved into this location he thought it was permanent. He said he would not know where to start with moving things and if it comes down to them having to get out he's going to commit suicide. I stress to the patient that this was not the answer and to contact the numbers I gave him. I also encourage him to f/u back up with me to let me know the outcome. He verbalize understanding.

## 2018-02-06 ENCOUNTER — Other Ambulatory Visit: Payer: Self-pay | Admitting: *Deleted

## 2018-02-06 ENCOUNTER — Ambulatory Visit: Payer: Self-pay | Admitting: *Deleted

## 2018-02-06 DIAGNOSIS — F3132 Bipolar disorder, current episode depressed, moderate: Secondary | ICD-10-CM | POA: Diagnosis not present

## 2018-02-06 DIAGNOSIS — J9611 Chronic respiratory failure with hypoxia: Secondary | ICD-10-CM | POA: Diagnosis not present

## 2018-02-06 DIAGNOSIS — J45909 Unspecified asthma, uncomplicated: Secondary | ICD-10-CM | POA: Diagnosis not present

## 2018-02-06 DIAGNOSIS — I10 Essential (primary) hypertension: Secondary | ICD-10-CM | POA: Diagnosis not present

## 2018-02-06 NOTE — Patient Outreach (Signed)
[facility]) Care Management  02/06/2018  Philip [name] [birth year] [id number]   Phone call to patient to discuss housing needs. Patient was not in, this social worker was told to try back later on in the afternoon.  This Child psychotherapist will make another attempt to contact patient later on today.     Adriana Reams [facility] Care Management [phone]

## 2018-02-06 NOTE — Telephone Encounter (Signed)
Attempted to contact patient at 3:00 pm.  No answer.  Also see that [facility], LCSW has attempted to contact twice today.  Note from [facility] RN from 4/24 states in note pt would have lease renewal in July.  Will need to confirm with Gerlene Burdock about existing lease, changes between 4/24 and 4/26 for his landlord in regards to date change for needing to move out 02/10/2018.

## 2018-02-06 NOTE — Patient Outreach (Signed)
[facility]) Care Management  02/06/2018  Philip [name] Dec 18, 1960 [id number]   Telephone assessment  Telephone outreach follow up call to patient, no answer able to leave message requesting a return call.   Plan Will attempt return call within this week, will collaborate with [facility] LCSW Chrystal Land.    Egbert Garibaldi, RN, [facility] Care Management,Care Management Coordinator  [phone]- Mobile [phone]- Toll Free Main Office

## 2018-02-06 NOTE — Patient Outreach (Signed)
[facility]) Care Management  02/06/2018  Philip [name] June 07, 1961 [id number]   Second attempt to reach patient to discuss his housing situation and to offer community resources. Patient did not answer. Voicemail message left for a return call.    Adriana Reams [facility] Care Management [phone]

## 2018-02-07 ENCOUNTER — Other Ambulatory Visit: Payer: Self-pay | Admitting: *Deleted

## 2018-02-07 ENCOUNTER — Encounter: Payer: Self-pay | Admitting: Nurse Practitioner

## 2018-02-07 ENCOUNTER — Telehealth: Payer: Self-pay | Admitting: Nurse Practitioner

## 2018-02-07 DIAGNOSIS — Z6841 Body Mass Index (BMI) 40.0 and over, adult: Secondary | ICD-10-CM

## 2018-02-07 NOTE — Patient Outreach (Addendum)
  [facility]) Care Management  02/07/2018  Philip [name] January 16, 1961 [id number]    Phone call from patient to inform this social work that he no longer has to move and that the landlord has renewed his lease. Per patient, he is no longer suicidal and that he appreciates the support obtained form his providers office and therapist office. Per patient, he was seen by his mental health provider on 02/06/17 and they were able to adjust his medications. Patient's refrigerator needs repair, per patient he went to the [employer] and was able to replenish what was lost. Patient now requesting assistance with his electricity and phone bill. Phone number given to patient for the [employer] [phone] for possible assistance with his electricity bill.  Plan: Patient states that he will call the [employer].          This social worker will follow up with patient on Friday, 02/10/18.    Adriana Reams [facility] Care Management [phone]

## 2018-02-07 NOTE — Telephone Encounter (Signed)
Patient contacted office for return of phone calls between Friday 4/26 and today.    Update: Pt is not currently suicidal.  He has had counseling session at [facility] on 4/29.  Pt admits this has made some things worse.  Pt is tearful on phone and continues to feel desperate to find more permanent housing.    Pt admits he has had breach of lease contract between December and February with a tenant not listed on lease.  He received permission to have tenant in December-January, but did not continue permission for full duration of that person's stay.  Otherwise, has had regular rent payments and is still in terms of lease through June as previously discussed with Ander Purpura, RN.  He has not received an eviction notice to date.  He admits having an argument with his landlord on Friday which prompted the change in pt moods and the phone call to [facility].  Instructed pt to have Minerva Areola call [employer] to complete the referral.  Instructed patient to call Chrystal Land to get additional resources for affordable housing.  He verbalized he had her message and phone number.  Admits his home phone may not be working correctly because he has not gotten any calls, but is getting messages.  In other matters, pt and his partner Philip [name] had requested [employer] referrals.  [employer] will only see one patient from the same residence, so the referral will be prioritized for Minerva Areola.  Climmie is still well connected and covered for care at [facility].  Philip [name] verbalizes understanding.

## 2018-02-07 NOTE — Patient Outreach (Signed)
[facility]) Care Management  02/07/2018  Philip [name] [birth year] [id number]   Telephone follow up call  Placed call to patient , HIPAA verified.  Patient discussed he no  longer has to move at this time and hoping to be able to sign a longer term lease. Patient discussed feeling being hurt by the situation and having difficulty trusting people at this time, able to discuss at recent mental health.   Patient reports he is trying to stay focused on continuing with taking his medication and taking insulin.  Patient discussed he needs to has to go now and has to hang up. .  Unable to address all care plan goals at this visit.   Plan  Will plan home visit in the next 2 weeks and patient is agreeable.   Egbert Garibaldi, RN, [facility] Care Management,Care Management Coordinator  [phone]- Mobile [phone]- Toll Free Main Office

## 2018-02-08 ENCOUNTER — Other Ambulatory Visit: Payer: Self-pay

## 2018-02-09 ENCOUNTER — Telehealth: Payer: Self-pay | Admitting: Nurse Practitioner

## 2018-02-09 NOTE — Telephone Encounter (Signed)
Out of Pocket is to be paid by [facility].  [facility] RN Minna Antis or [place], [place] is aware and had made the request for the order.

## 2018-02-09 NOTE — Telephone Encounter (Signed)
Angie with [facility] asked for a call back about a prescription she received for a bariatric commode.  He has a commode already but needs an extension but what they have is only up to 300 lbs.  Her call back number is [phone]

## 2018-02-10 ENCOUNTER — Other Ambulatory Visit: Payer: Self-pay | Admitting: *Deleted

## 2018-02-10 ENCOUNTER — Ambulatory Visit: Payer: Self-pay | Admitting: *Deleted

## 2018-02-10 NOTE — Patient Outreach (Signed)
[facility]) Care Management  02/10/2018  Philip [name] 05/16/1961 [id number]   Phone call to patient to discuss community resources to assist with his electric bill. Patient did not answer. Voicemail message left for a return call.    Adriana Reams [facility] Care Management [phone]

## 2018-02-10 NOTE — Patient Outreach (Signed)
[facility]) Care Management  02/10/2018  Philip [name] [birth year] [id number]   Phone call from patient on 02/09/18 requesting community resource assistance to pay his utility bill.  This Child psychotherapist will research options and call patient back.   Adriana Reams [facility] Care Management [phone]

## 2018-02-13 ENCOUNTER — Other Ambulatory Visit: Payer: Self-pay | Admitting: *Deleted

## 2018-02-13 ENCOUNTER — Telehealth: Payer: Self-pay | Admitting: Podiatry

## 2018-02-13 NOTE — Patient Outreach (Signed)
[facility]) Care Management  02/13/2018  Philip [name] 07-21-1961 [id number]   Phone call to patient to follow up on community resources provided to assist with his his utility expenses. Per patient, he did contact the [employer], however he has not received a return call. Patient contacted the Department of Social Services, however they are now out of funds.    Plan: Home visit scheduled for 02/23/18 to further discuss financial concerns and options.   Adriana Reams [facility] Care Management [phone]

## 2018-02-13 NOTE — Telephone Encounter (Signed)
Patient called about diabetic shoes. They wanted to know when are they going to be in so they can pick them up. If you can call patient back at [phone]

## 2018-02-14 ENCOUNTER — Other Ambulatory Visit: Payer: Self-pay

## 2018-02-14 ENCOUNTER — Other Ambulatory Visit: Payer: Medicare HMO

## 2018-02-14 DIAGNOSIS — E291 Testicular hypofunction: Secondary | ICD-10-CM

## 2018-02-15 ENCOUNTER — Telehealth: Payer: Self-pay | Admitting: Podiatry

## 2018-02-15 NOTE — Telephone Encounter (Signed)
Philip [name] from [facility] was called and left message about diabetic shoes for the pt. Pt has called there asking about it. Pt was seen by Dr Stacie Acres on 2.14.19 and was measured at that time. I do not see where the shoes were ordered from [employer]. Can you please call Philip [name] @ pts pcp office back.

## 2018-02-16 ENCOUNTER — Telehealth: Payer: Self-pay

## 2018-02-16 ENCOUNTER — Ambulatory Visit: Payer: Medicare HMO | Admitting: Urology

## 2018-02-16 ENCOUNTER — Telehealth: Payer: Self-pay | Admitting: Urology

## 2018-02-16 NOTE — Telephone Encounter (Signed)
I spoke with Philip [name] today and informed her that the patient has to physically see a MD before we can order Diabetic shoes.  She stated that she would work on getting an appt with the MD and call me back when patient has been seen.

## 2018-02-16 NOTE — Telephone Encounter (Signed)
I spoke w/ Angie from [facility] about the pt diabetic shoes. She informed me that the patient needs a face to face visit with a MD or DO. She states medicare and [employer] policy have changed with midlevels and the only way they can get their diabetic shoes is both patient Philip [name] and Philip [name] has to have a face-to-face office visit with Dr. Althea Charon.  Angie T[facility]- [phone]

## 2018-02-16 NOTE — Telephone Encounter (Signed)
Pt called office stating he has switched pharmacies from [facility] to [facility] on [address] in [place], pt states he needs refills on all his meds before his June appt that had to be moved out - pt needs Viagra, testosterone injections and needles. Please advise pt. @ [phone]. Thanks.

## 2018-02-17 ENCOUNTER — Other Ambulatory Visit: Payer: Self-pay | Admitting: *Deleted

## 2018-02-17 NOTE — Telephone Encounter (Signed)
Please advise 

## 2018-02-17 NOTE — Patient Outreach (Signed)
[facility]) Care Management  02/17/2018  Philip [name] 1961/03/13 [id number]   Bariatric extra wide raised toilet seat ordered for patient through [employer]. Order number [id number].    Plan: Home visit scheduled for 02/23/18.   Adriana Reams [facility] Care Management [phone]

## 2018-02-17 NOTE — Telephone Encounter (Signed)
Called to clarify specific needs for that face to face visit.  Message left with [name] as Philip [name] is not there today.

## 2018-02-20 MED ORDER — SILDENAFIL CITRATE 20 MG PO TABS: ORAL_TABLET | ORAL | 0 refills | Status: DC

## 2018-02-20 MED ORDER — TESTOSTERONE CYPIONATE 200 MG/ML IM SOLN: 200.0000 mg | INTRAMUSCULAR | 0 refills | Status: DC

## 2018-02-20 MED ORDER — "SYRINGE/NEEDLE (DISP) 20G X 1"" 3 ML MISC": 0 refills | Status: DC

## 2018-02-20 NOTE — Telephone Encounter (Signed)
rxs sent

## 2018-02-23 ENCOUNTER — Other Ambulatory Visit: Payer: Self-pay | Admitting: *Deleted

## 2018-02-23 NOTE — Patient Outreach (Addendum)
[facility]) Care Management   02/23/2018  Philip [name] 1961/05/28 [id number]  Philip [name] is an 57 y.o. male  Subjective:  Patient discussed feeling pretty good, states he hasn't been checking blood sugar on a regular basis an reports being overwhelmed due to thing around home, phones out of service and recent problems related to being able to continue lease at his home.  Patient discussed being relieved that things has worked out about staying in his home.  Patient discussed that he does want to work on keeping diabetes under control .  Patient discussed having a recent fall he was on the tailgate of his pickup truck holding on to  push mower, while his partner was driving and he slipped off. Patient  denies injury other than skin tear on right calf area in reference to skin tear patient  states that is nothing something like that happens all the time. Patient states that he gets enjoyment with doing yard work.    Objective: BP 136/80 (BP Location: Left Arm, Patient Position: Sitting, Cuff Size: Large)   Pulse 78   Resp 18   SpO2 95%  Review of Systems  Constitutional: Negative.   HENT: Negative.   Eyes: Negative.   Respiratory: Negative.   Cardiovascular: Negative.   Genitourinary: Negative.   Musculoskeletal: Negative.   Skin: Negative.   Neurological: Negative.   Endo/Heme/Allergies: Negative.   Psychiatric/Behavioral: Positive for depression.    Physical Exam  Constitutional: He is oriented to person, place, and time. He appears well-developed and well-nourished.  Cardiovascular: Normal rate and normal heart sounds.  Respiratory: Effort normal.  GI: Soft.  Neurological: He is alert and oriented to person, place, and time.  Skin: Skin is warm and dry.        Psychiatric: He has a normal mood and affect. His behavior is normal. Judgment and thought content normal.    Encounter Medications:   Outpatient Encounter Medications as of  02/23/2018  Medication Sig  . albuterol (VENTOLIN HFA) 108 (90 Base) MCG/ACT inhaler INL 1 TO 2 PFS PO Q 4 TO 6 H PRF WHZ  . aspirin EC 81 MG tablet Take 81 mg by mouth daily.  . B-D ULTRAFINE III SHORT PEN 31G X 8 MM MISC Inject 1 Device into the skin 5 (five) times daily.  . budesonide-formoterol (SYMBICORT) 160-4.5 MCG/ACT inhaler Inhale 2 puffs into the lungs 2 (two) times daily.  Marland Kitchen buPROPion (WELLBUTRIN SR) 100 MG 12 hr tablet TK 2 TS PO BID  . citalopram (CELEXA) 40 MG tablet Take 40 mg by mouth daily.  . clonazePAM (KLONOPIN) 1 MG tablet TAKE 1 TABLET BY MOUTH THREE TIMES DAILY AS NEEDED FOR ANXIETY  . diclofenac (VOLTAREN) 75 MG EC tablet Take 1 tablet (75 mg total) by mouth 2 (two) times daily.  . diclofenac sodium (VOLTAREN) 1 % GEL Apply 4 g topically 4 (four) times daily.  . fluticasone (FLONASE) 50 MCG/ACT nasal spray Place 2 sprays into both nostrils daily.  . furosemide (LASIX) 40 MG tablet Take 1 tablet (40 mg total) by mouth daily.  Marland Kitchen gabapentin (NEURONTIN) 400 MG capsule Take 2 capsules (800 mg total) by mouth 4 (four) times daily.  . Insulin Glargine (TOUJEO SOLOSTAR) 300 UNIT/ML SOPN Inject 62 Units into the skin 2 (two) times daily.  Marland Kitchen levocetirizine (XYZAL) 5 MG tablet Take 1 tablet (5 mg total) by mouth every evening.  Marland Kitchen levothyroxine (SYNTHROID, LEVOTHROID) 200 MCG tablet Take 1 tablet (200 mcg total)  by mouth daily before breakfast.  . lisinopril (PRINIVIL,ZESTRIL) 40 MG tablet Take 1 tablet (40 mg total) by mouth daily.  . metFORMIN (GLUCOPHAGE-XR) 500 MG 24 hr tablet Take 2 tablets (1,000 mg total) by mouth 2 (two) times daily with a meal.  . Multiple Vitamin (MULTIVITAMIN WITH MINERALS) TABS tablet Take 1 tablet by mouth daily.  . multivitamin-lutein (OCUVITE-LUTEIN) CAPS capsule Take 1 capsule by mouth daily.  Marland Kitchen NOVOLOG FLEXPEN 100 UNIT/ML FlexPen INJECT 20 UNITS UNDER THE SKIN TID BEFORE MEALS plus Sliding Scale correction up to 15 additional units per dose.  Marland Kitchen  omeprazole (PRILOSEC) 40 MG capsule TAKE 1 CAPSULE(40 MG) BY MOUTH TWICE DAILY  . omeprazole (PRILOSEC) 40 MG capsule Take 1 capsule (40 mg total) by mouth 2 (two) times daily.  . potassium chloride SA (K-DUR,KLOR-CON) 20 MEQ tablet Take 1 tablet (20 mEq total) by mouth daily.  . sildenafil (REVATIO) 20 MG tablet 2-5 tabs 1 hour prior to intercourse  . SYRINGE-NEEDLE, DISP, 3 ML (SAFETY SYRINGES/NEEDLE) 20G X 1" 3 ML MISC 1 mL as directed every 2 weeks  . tamsulosin (FLOMAX) 0.4 MG CAPS capsule Take 1 capsule (0.4 mg total) by mouth daily.  Marland Kitchen testosterone cypionate (DEPOTESTOSTERONE CYPIONATE) 200 MG/ML injection Inject 1 mL (200 mg total) into the muscle every 14 (fourteen) days.  . Tiotropium Bromide Monohydrate (SPIRIVA RESPIMAT) 1.25 MCG/ACT AERS Inhale 2 puffs into the lungs daily.  Marland Kitchen VICTOZA 18 MG/3ML SOPN Inject 0.3 mLs (1.8 mg total) into the skin daily.  . ziprasidone (GEODON) 20 MG capsule Take 1 capsule (20 mg total) by mouth 2 (two) times daily with a meal.  . atorvastatin (LIPITOR) 10 MG tablet Take 1 tablet (10 mg total) by mouth daily. (Patient not taking: Reported on 01/24/2018)  . Cholecalciferol (VITAMIN D PO) Take 1 capsule by mouth daily.   No facility-administered encounter medications on file as of 02/23/2018.     Functional Status:   In your present state of health, do you have any difficulty performing the following activities: 11/25/2017 11/01/2017  Hearing? N Y  Comment - advised of hearing clinics  Vision? Y Y  Comment expecting new glasses on 3/1 needs new glasses  Difficulty concentrating or making decisions? [name]  Walking or climbing stairs? [name]  Comment has difficulty with steps going up  knee pain   Dressing or bathing? Y N  Comment partner helps  -  Doing errands, shopping? N N  Preparing Food and eating ? Y N  Comment partner and friend helps as needed -  Using the Toilet? N N  In the past six months, have you accidently leaked urine? N N  Do you have  problems with loss of bowel control? N N  Managing your Medications? N N  Managing your Finances? N N  Housekeeping or managing your Housekeeping? Y N  Comment partner helps -    Fall/Depression Screening:    Fall Risk  11/25/2017 11/23/2017 11/01/2017  Falls in the past year? Yes Yes Yes  Number falls in past yr: - 2 or more 2 or more  Injury with Fall? Yes Yes No  Comment - scratches, bruises, muscle pain -  Risk Factor Category  High Fall Risk High Fall Risk High Fall Risk  Risk for fall due to : History of fall(s);Impaired balance/gait History of fall(s) -  Follow up - - Falls prevention discussed   PHQ 2/9 Scores 12/06/2017 11/23/2017 11/01/2017 10/31/2017 09/09/2017 07/05/2017  PHQ - 2 Score 4  _0 PHQ- 9 Score 12 - _1 Assessment:  Routine home co visit with Chrystal Land, LCSW.   Diabetes 30 day average 190, reports not eating meals regularly ,  interested in returning to nutrition class when, wants to wait until his phone service restored to be able to communicate regarding appointments, due to prior attempt to attend class and missed appointment.Denies any low blood sugar episodes. Review of low glycemic diet foods.  Patient interested in having scales to monitor his weights at home.  Medications- Reports  taking Toujeo , Victoza and 20 Novolog  units with meals, not always taking correction insulin when eating a meal. Review of prescribed  correction scale. Recent fall - safety/fall precautions reviewed, raised commode seat resource provided at visit by LCSW. Discussed adequate fluid intake when working outside.   Patient will benefit from continued complex care management program at this time Discussed with patient [facility] care management telephonic disease management program for continued education and support of chronic condition of Diabetes.   Plan:   .Will send [facility] secretary message regarding assisting patient with scales at home.  .Provided arm blood pressure  monitor for use at home with education on proper use demonstration and return demonstration.  .Care planning and goal setting addressed.  .Will plan continued complex care management  follow up, with call in the next month to follow  up on coordination of patient attending nutrition class, and contacting  PCP for referral if patient is in agreement,after his phone services has resumed.  .Will assess for  transition to telephonic health coach program in the next month for continued education and support if care coordination needs met.  Will send PCP visit note, quarterly report.   THN CM Care Plan Problem One     Most Recent Value  Care Plan Problem One  Knowledge related to self care managment of diabetes as evidenced by Alc greater than 8   Role Documenting the Problem One  Care Management [place] for Problem One  Active  [facility] Long Term Goal   Patient will be able to report measures to contol diabetes in the next 31 days   THN Long Term Goal Start Date  02/23/18  Interventions for Problem One Long Term Goal  Advised patient regarding importance of taking medications as prescribed,, provided written list of MD prescribed, correction scale with review.   THN CM Short Term Goal #1   Over the next 30 days patient will reports checking blood sugar at least twice daily   THN CM Short Term Goal #1 Start Date  02/23/18  [facility] CM Short Term Goal #1 Met Date  -- [goal restarted ]  Interventions for Short Term Goal #1  Reviewed meter at visit, discussed his barriers to not checking blood sugars,, encouraged regarding monitoring and benefits to control diabetes , discussed times of day to monitor bloods sugars,.before meals, /2 hours after meal and keeping a log for MD review   THN CM Short Term Goal #2   Patient will reports increase knowledge of meal planning in the next 30 days   THN CM Short Term Goal #2 Start Date  02/24/18  Interventions for Short Term Goal #2  Reviewed handout on low  glycemic food list previously provided by PCP office, reviewed protein to include in diet based on food supplies he normally has in home.   THN CM Short Term Goal #3  Over the  next 30 days patient will report increased walking to at least 15 minutes 3 days a week     THN CM Short Term Goal #3 Start Date  02/23/18  Interventions for Short Tern Goal #3  Reinforcement on continuing to working on activity and rationale of how activity helps with lowering stress, blood sugar and help with weight loss    THN CM Short Term Goal #4  Patient will report checking blood pressure at least once weekly in the next 30 days  THN CM Short Term Goal #4 Start Date  02/23/18  Interventions for Short Term Goal #4  Provided blood pressure monitor, demonstrated use and patient with return demonstration.        Joylene Draft, RN, [place] Management Coordinator  [phone]- Mobile [phone]- Toll Free Main Office

## 2018-02-24 NOTE — Patient Outreach (Signed)
[facility]) Care Management  [facility] Social Work  02/24/2018  Ishmael Berkovich II 03-25-1961 [id number]  Subjective:  Patient is a 57 year old male referred to this social worker for assistance with a bariatric raised toilet seat to assist in avoiding falls. Blood pressure monitor provided to patient by [facility].  Patient confirmed that the toilet seat had been received and was assembled during the visit. Patient followed by [facility] Follow up appointment scheduled for 04/15/18. Patient discussed actively utilizing the rea food banks. [employer] (once a month) Teacher, adult education on Wednesday, Monday, Tuesday, Thursday Friday, general) SAFE program(every Tuesday and Saturday). Patient further discussed having 8 dogs, 2 of which are ill and may need to be euthanized. Patient provided the number to [facility]. [phone]. Patient discussed dropping his phone and breaking it. Per patient, it was a government phone and was not sure of who to call to request another phone.  Multiple calls made to identify phone company. [employer] support contacted [phone]. Patient is eligible for a replacement phone, however the shipping fee is $5.00. Patient was not able to pay the fee and will order the phone when he receives his check next month. Patient relieved to have remained in his home and has since asked one of his roommates to move. Patient, however tearful  during the visit stating that he is overwhelmed  regarding his finances.Supportive counseling provided, referral for consumer credit counseling provided to patient along with [address] and [address] community resource list to address multiple psychosocial needs.  Objective:   Encounter Medications Outpatient Encounter Medications as of 02/23/2018  Medication Sig  . albuterol (VENTOLIN HFA) 108 (90 Base) MCG/ACT inhaler INL 1 TO 2 PFS PO Q 4 TO 6 H PRF WHZ  . aspirin EC 81 MG tablet Take 81  mg by mouth daily.  Marland Kitchen atorvastatin (LIPITOR) 10 MG tablet Take 1 tablet (10 mg total) by mouth daily. (Patient not taking: Reported on 01/24/2018)  . B-D ULTRAFINE III SHORT PEN 31G X 8 MM MISC Inject 1 Device into the skin 5 (five) times daily.  . budesonide-formoterol (SYMBICORT) 160-4.5 MCG/ACT inhaler Inhale 2 puffs into the lungs 2 (two) times daily.  Marland Kitchen buPROPion (WELLBUTRIN SR) 100 MG 12 hr tablet TK 2 TS PO BID  . Cholecalciferol (VITAMIN D PO) Take 1 capsule by mouth daily.  . citalopram (CELEXA) 40 MG tablet Take 40 mg by mouth daily.  . clonazePAM (KLONOPIN) 1 MG tablet TAKE 1 TABLET BY MOUTH THREE TIMES DAILY AS NEEDED FOR ANXIETY  . diclofenac (VOLTAREN) 75 MG EC tablet Take 1 tablet (75 mg total) by mouth 2 (two) times daily.  . diclofenac sodium (VOLTAREN) 1 % GEL Apply 4 g topically 4 (four) times daily.  . fluticasone (FLONASE) 50 MCG/ACT nasal spray Place 2 sprays into both nostrils daily.  . furosemide (LASIX) 40 MG tablet Take 1 tablet (40 mg total) by mouth daily.  Marland Kitchen gabapentin (NEURONTIN) 400 MG capsule Take 2 capsules (800 mg total) by mouth 4 (four) times daily.  . Insulin Glargine (TOUJEO SOLOSTAR) 300 UNIT/ML SOPN Inject 62 Units into the skin 2 (two) times daily.  Marland Kitchen levocetirizine (XYZAL) 5 MG tablet Take 1 tablet (5 mg total) by mouth every evening.  Marland Kitchen levothyroxine (SYNTHROID, LEVOTHROID) 200 MCG tablet Take 1 tablet (200 mcg total) by mouth daily before breakfast.  . lisinopril (PRINIVIL,ZESTRIL) 40 MG tablet Take 1 tablet (40 mg total) by mouth daily.  Marland Kitchen  metFORMIN (GLUCOPHAGE-XR) 500 MG 24 hr tablet Take 2 tablets (1,000 mg total) by mouth 2 (two) times daily with a meal.  . Multiple Vitamin (MULTIVITAMIN WITH MINERALS) TABS tablet Take 1 tablet by mouth daily.  . multivitamin-lutein (OCUVITE-LUTEIN) CAPS capsule Take 1 capsule by mouth daily.  Marland Kitchen NOVOLOG FLEXPEN 100 UNIT/ML FlexPen INJECT 20 UNITS UNDER THE SKIN TID BEFORE MEALS plus Sliding Scale correction up to 15  additional units per dose.  Marland Kitchen omeprazole (PRILOSEC) 40 MG capsule TAKE 1 CAPSULE(40 MG) BY MOUTH TWICE DAILY  . omeprazole (PRILOSEC) 40 MG capsule Take 1 capsule (40 mg total) by mouth 2 (two) times daily.  . potassium chloride SA (K-DUR,KLOR-CON) 20 MEQ tablet Take 1 tablet (20 mEq total) by mouth daily.  . sildenafil (REVATIO) 20 MG tablet 2-5 tabs 1 hour prior to intercourse  . SYRINGE-NEEDLE, DISP, 3 ML (SAFETY SYRINGES/NEEDLE) 20G X 1" 3 ML MISC 1 mL as directed every 2 weeks  . tamsulosin (FLOMAX) 0.4 MG CAPS capsule Take 1 capsule (0.4 mg total) by mouth daily.  Marland Kitchen testosterone cypionate (DEPOTESTOSTERONE CYPIONATE) 200 MG/ML injection Inject 1 mL (200 mg total) into the muscle every 14 (fourteen) days.  . Tiotropium Bromide Monohydrate (SPIRIVA RESPIMAT) 1.25 MCG/ACT AERS Inhale 2 puffs into the lungs daily.  Marland Kitchen VICTOZA 18 MG/3ML SOPN Inject 0.3 mLs (1.8 mg total) into the skin daily.  . ziprasidone (GEODON) 20 MG capsule Take 1 capsule (20 mg total) by mouth 2 (two) times daily with a meal.   No facility-administered encounter medications on file as of 02/23/2018.     Functional Status:  In your present state of health, do you have any difficulty performing the following activities: 11/25/2017 11/01/2017  Hearing? N Y  Comment - advised of hearing clinics  Vision? Y Y  Comment expecting new glasses on 3/1 needs new glasses  Difficulty concentrating or making decisions? [name]  Walking or climbing stairs? [name]  Comment has difficulty with steps going up  knee pain   Dressing or bathing? Y N  Comment partner helps  -  Doing errands, shopping? N N  Preparing Food and eating ? Y N  Comment partner and friend helps as needed -  Using the Toilet? N N  In the past six months, have you accidently leaked urine? N N  Do you have problems with loss of bowel control? N N  Managing your Medications? N N  Managing your Finances? N N  Housekeeping or managing your Housekeeping? Y N  Comment  partner helps -    Fall/Depression Screening:  PHQ 2/9 Scores 12/06/2017 11/23/2017 11/01/2017 10/31/2017 09/09/2017 07/05/2017  PHQ - 2 Score PHQ- 9 Score 12 - Assessment: Patient grateful for bariatric toilet seat and additional community resources provided. Patient tearful while discussing his financial issues, supportive counseling provided as well as continued follow up with his mental health providers. Patient verbalized no additional community resource needs at this time. Patient to be closed to social work.  Plan: Patient to be closed to social work at this time.           Patient's RNCM and provider to be notified.   Adriana Reams [facility] Care Management [phone]

## 2018-02-25 ENCOUNTER — Other Ambulatory Visit: Payer: Self-pay

## 2018-02-25 ENCOUNTER — Encounter: Payer: Self-pay | Admitting: Emergency Medicine

## 2018-02-25 ENCOUNTER — Emergency Department
Admission: EM | Admit: 2018-02-25 | Discharge: 2018-02-25 | Disposition: A | Discharge status: Home / Self Care | Payer: Medicare HMO | Attending: Emergency Medicine | Admitting: Emergency Medicine

## 2018-02-25 DIAGNOSIS — Z794 Long term (current) use of insulin: Secondary | ICD-10-CM | POA: Diagnosis not present

## 2018-02-25 DIAGNOSIS — S8992XA Unspecified injury of left lower leg, initial encounter: Secondary | ICD-10-CM | POA: Diagnosis present

## 2018-02-25 DIAGNOSIS — Y998 Other external cause status: Secondary | ICD-10-CM | POA: Insufficient documentation

## 2018-02-25 DIAGNOSIS — Z91040 Latex allergy status: Secondary | ICD-10-CM | POA: Insufficient documentation

## 2018-02-25 DIAGNOSIS — E119 Type 2 diabetes mellitus without complications: Secondary | ICD-10-CM | POA: Insufficient documentation

## 2018-02-25 DIAGNOSIS — Z7982 Long term (current) use of aspirin: Secondary | ICD-10-CM | POA: Insufficient documentation

## 2018-02-25 DIAGNOSIS — W01198A Fall on same level from slipping, tripping and stumbling with subsequent striking against other object, initial encounter: Secondary | ICD-10-CM | POA: Insufficient documentation

## 2018-02-25 DIAGNOSIS — Y92812 Truck as the place of occurrence of the external cause: Secondary | ICD-10-CM | POA: Insufficient documentation

## 2018-02-25 DIAGNOSIS — E039 Hypothyroidism, unspecified: Secondary | ICD-10-CM | POA: Diagnosis not present

## 2018-02-25 DIAGNOSIS — S81812A Laceration without foreign body, left lower leg, initial encounter: Secondary | ICD-10-CM | POA: Diagnosis not present

## 2018-02-25 DIAGNOSIS — J45909 Unspecified asthma, uncomplicated: Secondary | ICD-10-CM | POA: Diagnosis not present

## 2018-02-25 DIAGNOSIS — Y9389 Activity, other specified: Secondary | ICD-10-CM | POA: Insufficient documentation

## 2018-02-25 DIAGNOSIS — Z79899 Other long term (current) drug therapy: Secondary | ICD-10-CM | POA: Insufficient documentation

## 2018-02-25 DIAGNOSIS — J449 Chronic obstructive pulmonary disease, unspecified: Secondary | ICD-10-CM | POA: Insufficient documentation

## 2018-02-25 LAB — CBC WITH DIFFERENTIAL/PLATELET
Basophils Absolute: 0.1 10*3/uL (ref 0–0.1)
Basophils Relative: 1 %
EOS ABS: 0.3 10*3/uL (ref 0–0.7)
EOS PCT: 6 %
HCT: 46.4 % (ref 40.0–52.0)
Hemoglobin: 15.9 g/dL (ref 13.0–18.0)
LYMPHS ABS: 1.8 10*3/uL (ref 1.0–3.6)
Lymphocytes Relative: 29 %
MCH: 32 pg (ref 26.0–34.0)
MCHC: 34.3 g/dL (ref 32.0–36.0)
MCV: 93.5 fL (ref 80.0–100.0)
MONOS PCT: 17 %
Monocytes Absolute: 1.1 10*3/uL — ABNORMAL HIGH (ref 0.2–1.0)
Neutro Abs: 3 10*3/uL (ref 1.4–6.5)
Neutrophils Relative %: 47 %
PLATELETS: 352 10*3/uL (ref 150–440)
RBC: 4.97 MIL/uL (ref 4.40–5.90)
RDW: 14.1 % (ref 11.5–14.5)
WBC: 6.3 10*3/uL (ref 3.8–10.6)

## 2018-02-25 LAB — BASIC METABOLIC PANEL
Anion gap: 9 (ref 5–15)
BUN: 14 mg/dL (ref 6–20)
CO2: 25 mmol/L (ref 22–32)
CREATININE: 1.14 mg/dL (ref 0.61–1.24)
Calcium: 9.5 mg/dL (ref 8.9–10.3)
Chloride: 103 mmol/L (ref 101–111)
GFR calc Af Amer: >60 mL/min (ref >60)
GFR calc non Af Amer: >60 mL/min (ref >60)
GLUCOSE: 129 mg/dL — AB (ref 65–99)
Potassium: 4.1 mmol/L (ref 3.5–5.1)
SODIUM: 137 mmol/L (ref 135–145)

## 2018-02-25 LAB — GLUCOSE, CAPILLARY: Glucose-Capillary: 126 mg/dL — ABNORMAL HIGH (ref 65–99)

## 2018-02-25 MED ORDER — COLLAGENASE 250 UNIT/GM EX OINT: TOPICAL_OINTMENT | CUTANEOUS | 1 refills | Status: AC
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
## 2018-02-25 NOTE — ED Notes (Signed)
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
## 2018-03-01 ENCOUNTER — Other Ambulatory Visit: Payer: Self-pay | Admitting: *Deleted

## 2018-03-01 NOTE — Patient Outreach (Signed)
[facility]) Care Management  03/01/2018  Philip [name] Oct 27, 1960 [id number]  Incoming call from patient to inform RN of his new telephone number, updated in Epic.   Discussed with patient his recent visit to ED states skin tear on right leg started to look worse and he needed santyl cream because that is what works for him.  Patient states wound is looking better he keeps a layer of santyl on area, and states he was instructed to leave open to air, except when he is mowing the yard.  Discussed with patient follow up with [name] PA states he will contact.   Plan  Will plan follow up telephone in the next 2 weeks.  Reinforced follow up with PCP and sooner if wound condition changes.   Egbert Garibaldi, RN, [facility] Care Management,Care Management Coordinator  [phone]- Mobile [phone]- Toll Free Main Office

## 2018-03-07 ENCOUNTER — Other Ambulatory Visit: Payer: Self-pay | Admitting: *Deleted

## 2018-03-07 ENCOUNTER — Other Ambulatory Visit: Payer: Self-pay | Admitting: Nurse Practitioner

## 2018-03-07 ENCOUNTER — Other Ambulatory Visit: Payer: Self-pay

## 2018-03-07 DIAGNOSIS — M159 Polyosteoarthritis, unspecified: Secondary | ICD-10-CM

## 2018-03-07 DIAGNOSIS — M150 Primary generalized (osteo)arthritis: Principal | ICD-10-CM

## 2018-03-07 DIAGNOSIS — E119 Type 2 diabetes mellitus without complications: Secondary | ICD-10-CM

## 2018-03-07 MED ORDER — DICLOFENAC SODIUM 75 MG PO TBEC: 75.0000 mg | DELAYED_RELEASE_TABLET | Freq: Two times a day (BID) | ORAL | 2 refills | Status: DC

## 2018-03-07 MED ORDER — NOVOLOG FLEXPEN 100 UNIT/ML ~~LOC~~ SOPN: PEN_INJECTOR | SUBCUTANEOUS | 6 refills | Status: DC

## 2018-03-07 NOTE — Telephone Encounter (Signed)
Pt. Called  Requesting refill on  Diclofenac 75 mg tablet , novolog flexpen  Called into  [employer]
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
## 2018-03-07 NOTE — Telephone Encounter (Signed)
Refills and left message for patient.

## 2018-03-08 DIAGNOSIS — I10 Essential (primary) hypertension: Secondary | ICD-10-CM | POA: Diagnosis not present

## 2018-03-08 DIAGNOSIS — J45909 Unspecified asthma, uncomplicated: Secondary | ICD-10-CM | POA: Diagnosis not present

## 2018-03-08 DIAGNOSIS — J9611 Chronic respiratory failure with hypoxia: Secondary | ICD-10-CM | POA: Diagnosis not present

## 2018-03-13 ENCOUNTER — Other Ambulatory Visit: Payer: Self-pay | Admitting: *Deleted

## 2018-03-13 NOTE — Patient Outreach (Signed)
[facility] Care Management  03/13/2018  Keitha Butteichard Wible II 05-23-1961 [id number]   Co-visit with Kris Moutononnie Owens with [facility] marketing department to complete impact statements related to items received using the [facility] Giving Fund. Appropriate consents signed during the visit.   Adriana ReamsChrystal Land, LCSW [facility] Care Management [phone]

## 2018-03-14 ENCOUNTER — Encounter: Payer: Self-pay | Admitting: Urology

## 2018-03-14 ENCOUNTER — Telehealth: Payer: Self-pay | Admitting: Family Medicine

## 2018-03-14 ENCOUNTER — Other Ambulatory Visit: Payer: Self-pay | Admitting: Family Medicine

## 2018-03-14 ENCOUNTER — Other Ambulatory Visit: Payer: Medicare HMO

## 2018-03-14 ENCOUNTER — Other Ambulatory Visit
Admission: RE | Admit: 2018-03-14 | Discharge: 2018-03-14 | Disposition: A | Discharge status: Home / Self Care | Payer: Medicare HMO | Source: Ambulatory Visit | Attending: Urology | Admitting: Urology

## 2018-03-14 DIAGNOSIS — E291 Testicular hypofunction: Secondary | ICD-10-CM | POA: Diagnosis not present

## 2018-03-14 NOTE — Telephone Encounter (Signed)
Pt called to check the status of diabetic shoes [phone]

## 2018-03-14 NOTE — Addendum Note (Signed)
Addended by: Deloria LairAYLOR, Tamirra Sienkiewicz on: 03/14/2018 08:44 AM   Modules accepted: Orders

## 2018-03-14 NOTE — Telephone Encounter (Signed)
Pt needs F2F with Dr. Kirtland BouchardK.  Will have Dr. Domingo MendK cosign note and assess feet with next visit with me.  Pt called to inform.  Left brief message on voicemail.

## 2018-03-15 LAB — TESTOSTERONE: Testosterone: 213 ng/dL — ABNORMAL LOW (ref 264–916)

## 2018-03-16 ENCOUNTER — Other Ambulatory Visit: Payer: Self-pay | Admitting: *Deleted

## 2018-03-16 ENCOUNTER — Encounter: Payer: Self-pay | Admitting: Urology

## 2018-03-16 ENCOUNTER — Ambulatory Visit (INDEPENDENT_AMBULATORY_CARE_PROVIDER_SITE_OTHER): Payer: Medicare HMO | Admitting: Urology

## 2018-03-16 VITALS — BP 160/78 | HR 81 | Ht 76.0 in | Wt >= 6400 oz

## 2018-03-16 DIAGNOSIS — N5201 Erectile dysfunction due to arterial insufficiency: Secondary | ICD-10-CM | POA: Diagnosis not present

## 2018-03-16 DIAGNOSIS — E291 Testicular hypofunction: Secondary | ICD-10-CM

## 2018-03-16 MED ORDER — TESTOSTERONE CYPIONATE 200 MG/ML IM SOLN: 140.0000 mg | INTRAMUSCULAR | 0 refills | Status: DC

## 2018-03-16 MED ORDER — "NEEDLE (DISP) 21G X 1-1/2"" MISC": 2 refills | Status: DC

## 2018-03-16 NOTE — Progress Notes (Signed)
03/16/2018 2:18 PM   Philip [name] 1961/01/29 [id number]  Referring provider: Galen ManilaKennedy, Lauren Renee, NP [address] [place] [place], [place] [address]  Chief Complaint  Patient presents with  . Hypogonadism    HPI: 57 year old male presents for follow-up of hypogonadism. Refer to my previous note dated 01/04/2018.  He did not start his injections until approximately 4-6 weeks ago.  He has not seen significant improvement in his tiredness or libido.  He is using sildenafil for ED with good results.  He is injecting 200 mg every 2 weeks.  His last injection was 2 weeks ago.  A testosterone level drawn on 11/14/2017 was low at 213.  PMH: Past Medical History:  Diagnosis Date  . Anxiety   . Asthma   . Bipolar 1 disorder (HCC)   . COPD (chronic obstructive pulmonary disease) (HCC)   . Depression   . Diabetes mellitus without complication (HCC)   . GERD (gastroesophageal reflux disease)   . Osteoarthritis   . Thyroid disease     Surgical History: Past Surgical History:  Procedure Laterality Date  . CHOLECYSTECTOMY    . SHOULDER SURGERY Right   . UMBILICAL HERNIA REPAIR      Home Medications:  Allergies as of 03/16/2018      Reactions   Aripiprazole    Other reaction(s): Other (see comments) Unknown per patient   Duloxetine Hcl Other (See Comments)   unsure   Fenofibrate    Other reaction(s): Other (see comments) Muscle twitching   Lamotrigine Other (See Comments)   fever   Pregabalin Swelling, Other (See Comments)   Swelling and weight gain   Simvastatin    Other reaction(s): Other (see comments) Myalgia   Latex Rash   Valproic Acid Rash      Medication List        Accurate as of 03/16/18  2:18 PM. Always use your most recent med list.          aspirin EC 81 MG tablet Take 81 mg by mouth daily.   atorvastatin 10 MG tablet Commonly known as:  LIPITOR Take 1 tablet (10 mg total) by mouth daily.   B-D ULTRAFINE III SHORT PEN 31G X 8 MM Misc Generic drug:   Insulin Pen Needle Inject 1 Device into the skin 5 (five) times daily.   budesonide-formoterol 160-4.5 MCG/ACT inhaler Commonly known as:  SYMBICORT Inhale 2 puffs into the lungs 2 (two) times daily.   buPROPion 100 MG 12 hr tablet Commonly known as:  WELLBUTRIN SR TK 2 TS PO BID   citalopram 40 MG tablet Commonly known as:  CELEXA Take 40 mg by mouth daily.   clonazePAM 1 MG tablet Commonly known as:  KLONOPIN TAKE 1 TABLET BY MOUTH THREE TIMES DAILY AS NEEDED FOR ANXIETY   collagenase ointment Commonly known as:  SANTYL Apply thin layer to wound 1-2 times daily until healing has begun   diclofenac 75 MG EC tablet Commonly known as:  VOLTAREN Take 1 tablet (75 mg total) by mouth 2 (two) times daily.   diclofenac sodium 1 % Gel Commonly known as:  VOLTAREN Apply 4 g topically 4 (four) times daily.   fluticasone 50 MCG/ACT nasal spray Commonly known as:  FLONASE Place 2 sprays into both nostrils daily.   furosemide 40 MG tablet Commonly known as:  LASIX Take 1 tablet (40 mg total) by mouth daily.   gabapentin 400 MG capsule Commonly known as:  NEURONTIN Take 2 capsules (800 mg total) by  mouth 4 (four) times daily.   Insulin Glargine 300 UNIT/ML Sopn Commonly known as:  TOUJEO SOLOSTAR Inject 62 Units into the skin 2 (two) times daily.   levocetirizine 5 MG tablet Commonly known as:  XYZAL Take 1 tablet (5 mg total) by mouth every evening.   levothyroxine 200 MCG tablet Commonly known as:  SYNTHROID, LEVOTHROID Take 1 tablet (200 mcg total) by mouth daily before breakfast.   lisinopril 40 MG tablet Commonly known as:  PRINIVIL,ZESTRIL Take 1 tablet (40 mg total) by mouth daily.   metFORMIN 500 MG 24 hr tablet Commonly known as:  GLUCOPHAGE-XR Take 2 tablets (1,000 mg total) by mouth 2 (two) times daily with a meal.   multivitamin with minerals Tabs tablet Take 1 tablet by mouth daily.   multivitamin-lutein Caps capsule Take 1 capsule by mouth daily.     NOVOLOG FLEXPEN 100 UNIT/ML FlexPen Generic drug:  insulin aspart INJECT 20 UNITS UNDER THE SKIN TID BEFORE MEALS plus Sliding Scale correction up to 15 additional units per dose.   omeprazole 40 MG capsule Commonly known as:  PRILOSEC Take 1 capsule (40 mg total) by mouth 2 (two) times daily.   potassium chloride SA 20 MEQ tablet Commonly known as:  K-DUR,KLOR-CON Take 1 tablet (20 mEq total) by mouth daily.   sildenafil 20 MG tablet Commonly known as:  REVATIO 2-5 tabs 1 hour prior to intercourse   SYRINGE-NEEDLE (DISP) 3 ML 20G X 1" 3 ML Misc Commonly known as:  SAFETY SYRINGES/NEEDLE 1 mL as directed every 2 weeks   tamsulosin 0.4 MG Caps capsule Commonly known as:  FLOMAX Take 1 capsule (0.4 mg total) by mouth daily.   testosterone cypionate 200 MG/ML injection Commonly known as:  DEPOTESTOSTERONE CYPIONATE Inject 1 mL (200 mg total) into the muscle every 14 (fourteen) days.   Tiotropium Bromide Monohydrate 1.25 MCG/ACT Aers Commonly known as:  SPIRIVA RESPIMAT Inhale 2 puffs into the lungs daily.   VENTOLIN HFA 108 (90 Base) MCG/ACT inhaler Generic drug:  albuterol INL 1 TO 2 PFS PO Q 4 TO 6 H PRF WHZ   VICTOZA 18 MG/3ML Sopn Generic drug:  liraglutide Inject 0.3 mLs (1.8 mg total) into the skin daily.   VITAMIN D PO Take 1 capsule by mouth daily.   ziprasidone 20 MG capsule Commonly known as:  GEODON Take 1 capsule (20 mg total) by mouth 2 (two) times daily with a meal.       Allergies:  Allergies  Allergen Reactions  . Aripiprazole     Other reaction(s): Other (see comments) Unknown per patient  . Duloxetine Hcl Other (See Comments)    unsure  . Fenofibrate     Other reaction(s): Other (see comments) Muscle twitching  . Lamotrigine Other (See Comments)    fever  . Pregabalin Swelling and Other (See Comments)    Swelling and weight gain  . Simvastatin     Other reaction(s): Other (see comments) Myalgia  . Latex Rash  . Valproic Acid Rash     Family History: Family History  Problem Relation Age of Onset  . Depression Mother   . Diabetes Mother   . Heart disease Mother   . Depression Father   . Lung cancer Father   . Diabetes Father   . Breast cancer Sister   . Anxiety disorder Sister   . Depression Sister   . Stroke Neg Hx     Social History:  reports that he has never smoked. He has never  used smokeless tobacco. He reports that he drinks alcohol. He reports that he does not use drugs.  ROS: UROLOGY Frequent Urination?: No Hard to postpone urination?: No Burning/pain with urination?: No Get up at night to urinate?: Yes Leakage of urine?: No Urine stream starts and stops?: No Trouble starting stream?: No Do you have to strain to urinate?: No Blood in urine?: No Urinary tract infection?: No Sexually transmitted disease?: No Injury to kidneys or bladder?: No Painful intercourse?: No Weak stream?: No Erection problems?: Yes Penile pain?: No  Gastrointestinal Nausea?: No Vomiting?: No Indigestion/heartburn?: Yes Diarrhea?: Yes Constipation?: No  Constitutional Fever: No Night sweats?: No Weight loss?: No Fatigue?: Yes  Skin Skin rash/lesions?: Yes Itching?: No  Eyes Blurred vision?: No Double vision?: No  Ears/Nose/Throat Sore throat?: No Sinus problems?: Yes  Hematologic/Lymphatic Swollen glands?: No Easy bruising?: No  Cardiovascular Leg swelling?: No Chest pain?: No  Respiratory Cough?: Yes Shortness of breath?: Yes  Endocrine Excessive thirst?: No  Musculoskeletal Back pain?: Yes Joint pain?: Yes  Neurological Headaches?: No Dizziness?: No  Psychologic Depression?: Yes Anxiety?: Yes  Physical Exam: BP (!) 160/78   Pulse 81   Ht 6\' 4"  (1.93 m)   Wt (!) 423 lb (191.9 kg)   BMI 51.49 kg/m   Constitutional:  Alert and oriented, No acute distress. HEENT: [place] AT, moist mucus membranes.  Trachea midline, no masses. Cardiovascular: No clubbing, cyanosis, or  edema. Respiratory: Normal respiratory effort, no increased work of breathing. GI: Abdomen is soft, nontender, nondistended, no abdominal masses GU: No CVA tenderness Lymph: No cervical or inguinal lymphadenopathy. Skin: No rashes, bruises or suspicious lesions. Neurologic: Grossly intact, no focal deficits, moving all 4 extremities. Psychiatric: Normal mood and affect.  Laboratory Data:  Lab Results  Component Value Date   TESTOSTERONE 213 (L) 03/14/2018     Assessment & Plan:   Will change his dose to 140 mg weekly.  Follow-up 2 months for a repeat testosterone level, hematocrit and PSA.  He has been injecting with an 18-gauge needle and Rx for 21-gauge needle was sent to his pharmacy.  Sildenafil was refilled.  Riki Altes, MD  [facility] [address] [place], [place] [address] [phone]

## 2018-03-16 NOTE — Patient Outreach (Signed)
[facility] Care Management  03/16/2018  Philip [name] Nov 11, 1960 [id number]   Telephone assessment follow up call   Successful outreach call to patient, HIPAA information verified.  Patient reports that he is getting better. He discussed office visit to urologist on today and his testosterone injections being increased,due to low levels .  Patient discussed his compliant of size of needle being uncomfortable and discovered he was using same needle to draw medication from vial and administer, states he now has 2 different needles , smaller one for injection.   Patient reports has been consistently monitoring blood sugars, reports blood sugar today is 122, and ranges has been 160 and below per report, patient denies any low blood sugars less than 70. Patient discussed again wanting to keep his blood sugar under control, due to family history of problems related to Diabetes.  Patient discussed wound on right leg from recent fall  is healing, discussed with patient follow up to PCP office after ED visit  states he called but did not make an appointment  .  Discussed progression to telephonic disease management problem for continued education and support of Diabetes after next home visit , patient is in agreement.  Updated patient to anticipate scale delivery to home to monitoring weights as patient requested assistance with scales.    Plan Will plan home visit in the next 2 weeks with graduation to disease management program if no new care coordination needs to be addressed.    Egbert GaribaldiKimberly Bagot, RN, [facility] Care Management,Care Management Coordinator  [phone] Mobile [phone] Toll Free Main Office

## 2018-03-20 ENCOUNTER — Ambulatory Visit: Payer: Medicare HMO | Admitting: Nurse Practitioner

## 2018-03-28 ENCOUNTER — Other Ambulatory Visit: Payer: Self-pay | Admitting: *Deleted

## 2018-03-28 NOTE — Patient Outreach (Signed)
[facility] Care Management   03/28/2018  Philip [name] 1961/09/08 [id number]    Home visit    Philip ButteRichard [name] [name] is an 57 y.o. male  Subjective:  Patient discussed that he has been forgetting to check his blood sugar reports taking insulin . Patient reports that he still wants to work on keeping his blood sugar under control because is knows how it has effected some of his family members with many complications.   Patient reports tolerating being active with yard work, no further episode of falls.   Objective:  BP 124/78 (BP Location: Left Arm, Patient Position: Sitting, Cuff Size: Large)   Pulse 80   Resp 20   SpO2 97%  Review of Systems  Constitutional: Negative.   HENT: Negative.   Eyes: Negative.   Respiratory: Negative.   Cardiovascular: Negative.   Gastrointestinal: Negative.   Genitourinary: Negative.   Musculoskeletal: Negative.   Skin: Negative.   Neurological: Negative.   Endo/Heme/Allergies: Negative.   Psychiatric/Behavioral: Negative.     Physical Exam  Constitutional: He appears well-developed and well-nourished.  Cardiovascular: Normal rate and normal heart sounds.  Respiratory: Effort normal and breath sounds normal.  GI: Soft. Bowel sounds are normal.  Skin: Skin is warm and dry.     Psychiatric: He has a normal mood and affect. His behavior is normal. Judgment and thought content normal.    Encounter Medications:   Outpatient Encounter Medications as of 03/28/2018  Medication Sig  . albuterol (VENTOLIN HFA) 108 (90 Base) MCG/ACT inhaler INL 1 TO 2 PFS PO Q 4 TO 6 H PRF WHZ  . aspirin EC 81 MG tablet Take 81 mg by mouth daily.  Marland Kitchen. atorvastatin (LIPITOR) 10 MG tablet Take 1 tablet (10 mg total) by mouth daily.  . B-D ULTRAFINE III SHORT PEN 31G X 8 MM MISC Inject 1 Device into the skin 5 (five) times daily.  . budesonide-formoterol (SYMBICORT) 160-4.5 MCG/ACT inhaler Inhale 2 puffs into the lungs 2 (two) times daily.  Marland Kitchen.  buPROPion (WELLBUTRIN SR) 100 MG 12 hr tablet TK 2 TS PO BID  . Cholecalciferol (VITAMIN D PO) Take 1 capsule by mouth daily.  . citalopram (CELEXA) 40 MG tablet Take 40 mg by mouth daily.  . clonazePAM (KLONOPIN) 1 MG tablet TAKE 1 TABLET BY MOUTH THREE TIMES DAILY AS NEEDED FOR ANXIETY  . collagenase (SANTYL) ointment Apply thin layer to wound 1-2 times daily until healing has begun  . diclofenac (VOLTAREN) 75 MG EC tablet Take 1 tablet (75 mg total) by mouth 2 (two) times daily.  . diclofenac sodium (VOLTAREN) 1 % GEL Apply 4 g topically 4 (four) times daily.  . fluticasone (FLONASE) 50 MCG/ACT nasal spray Place 2 sprays into both nostrils daily.  . furosemide (LASIX) 40 MG tablet Take 1 tablet (40 mg total) by mouth daily.  Marland Kitchen. gabapentin (NEURONTIN) 400 MG capsule Take 2 capsules (800 mg total) by mouth 4 (four) times daily.  . Insulin Glargine (TOUJEO SOLOSTAR) 300 UNIT/ML SOPN Inject 62 Units into the skin 2 (two) times daily.  Marland Kitchen. levocetirizine (XYZAL) 5 MG tablet Take 1 tablet (5 mg total) by mouth every evening.  Marland Kitchen. levothyroxine (SYNTHROID, LEVOTHROID) 200 MCG tablet Take 1 tablet (200 mcg total) by mouth daily before breakfast.  . lisinopril (PRINIVIL,ZESTRIL) 40 MG tablet Take 1 tablet (40 mg total) by mouth daily.  . metFORMIN (GLUCOPHAGE-XR) 500 MG 24 hr tablet Take 2 tablets (1,000 mg total) by mouth 2 (two) times daily  with a meal.  . Multiple Vitamin (MULTIVITAMIN WITH MINERALS) TABS tablet Take 1 tablet by mouth daily.  . multivitamin-lutein (OCUVITE-LUTEIN) CAPS capsule Take 1 capsule by mouth daily.  Marland Kitchen NEEDLE, DISP, 21 G 21G X 1-1/2" MISC For testosterone injections as directed  . NOVOLOG FLEXPEN 100 UNIT/ML FlexPen INJECT 20 UNITS UNDER THE SKIN TID BEFORE MEALS plus Sliding Scale correction up to 15 additional units per dose.  Marland Kitchen omeprazole (PRILOSEC) 40 MG capsule Take 1 capsule (40 mg total) by mouth 2 (two) times daily.  . potassium chloride SA (K-DUR,KLOR-CON) 20 MEQ tablet  Take 1 tablet (20 mEq total) by mouth daily.  . sildenafil (REVATIO) 20 MG tablet 2-5 tabs 1 hour prior to intercourse  . SYRINGE-NEEDLE, DISP, 3 ML (SAFETY SYRINGES/NEEDLE) 20G X 1" 3 ML MISC 1 mL as directed every 2 weeks  . tamsulosin (FLOMAX) 0.4 MG CAPS capsule Take 1 capsule (0.4 mg total) by mouth daily.  Marland Kitchen testosterone cypionate (DEPOTESTOSTERONE CYPIONATE) 200 MG/ML injection Inject 0.7 mLs (140 mg total) into the muscle once a week.  . Tiotropium Bromide Monohydrate (SPIRIVA RESPIMAT) 1.25 MCG/ACT AERS Inhale 2 puffs into the lungs daily.  Marland Kitchen VICTOZA 18 MG/3ML SOPN Inject 0.3 mLs (1.8 mg total) into the skin daily.  . ziprasidone (GEODON) 20 MG capsule Take 1 capsule (20 mg total) by mouth 2 (two) times daily with a meal.   No facility-administered encounter medications on file as of 03/28/2018.     Functional Status:   In your present state of health, do you have any difficulty performing the following activities: 11/25/2017 11/01/2017  Hearing? N Y  Comment - advised of hearing clinics  Vision? Y Y  Comment expecting new glasses on 3/1 needs new glasses  Difficulty concentrating or making decisions? Philip [name]  Walking or climbing stairs? Philip [name]  Comment has difficulty with steps going up  knee pain   Dressing or bathing? Y N  Comment partner helps  -  Doing errands, shopping? N N  Preparing Food and eating ? Y N  Comment partner and friend helps as needed -  Using the Toilet? N N  In the past six months, have you accidently leaked urine? N N  Do you have problems with loss of bowel control? N N  Managing your Medications? N N  Managing your Finances? N N  Housekeeping or managing your Housekeeping? Y N  Comment partner helps -    Fall/Depression Screening:    Fall Risk  11/25/2017 11/23/2017 11/01/2017  Falls in the past year? Yes Yes Yes  Number falls in past yr: - 2 or more 2 or more  Injury with Fall? Yes Yes No  Comment - scratches, bruises, muscle pain -  Risk Factor  Category  High Fall Risk High Fall Risk High Fall Risk  Risk for fall due to : History of fall(s);Impaired balance/gait History of fall(s) -  Follow up - - Falls prevention discussed   PHQ 2/9 Scores 12/06/2017 11/23/2017 11/01/2017 10/31/2017 09/09/2017 07/05/2017  PHQ - 2 Score 4 1 2 2 2 5   PHQ- 9 Score 12 - 9 8 4 20     Assessment:  Routine home visit    Diabetes  Patient will benefit from continued education and self care management of Diabetes. Alc 8.3 on 01/11/18 Patient is now agreeable to attending  Diabetes nutrition class. CBG meter reviewed  CBG this am 152, reading last night 387  , 30 days average 268, with noted < 10 test.  No low blood sugar readings ,  Patient usually sleeps late and has inconsistent regular meal intake, usually drinks ensure in the mornings and eats heavier meals later in the day and some inconsistency in taking insulin, stating daily insulin plan including sliding scale when reviewed. Patient attending food banks and getting good food  supply  fruits , vegetables, protein food sources available in home, patient does the cooking at home, Noted he has been drinking regular soda lately, but has  completed package now, and voiced understanding how regular soda impact blood sugar readings.  Patient has Podiatry visit 2/14 and plans to get shoes after PCP exam in next month.  COPD Taking inhalers as prescribed, keeps rescue inhaler handy for when working outside if needed.  Bipolar/Depression   Consistent with attending monthly mental health appointments and taking medication.  Medications  Reports taking as prescribed, getting prescriptions from [facility] Reported Fall  incident while sitting on back of truck tailgate  ,Right lower leg skin tear  Site healing scab appearance at center of skin tear, patient using Santyl cream as prescribed at ED visit as patient requested, he did not follow up with PCP as recommended and encouraged . No further in home falls .     Again discussed with patient telephonic health coach program for continued support management of Diabetes, patient is agreeable Patient denies any new concerns at this time for care coordination .     Plan:  Will send PCP visit note, and communicate patient request for referral to Nutrition class. Will follow up with Administrative assistant regarding previous request for patient scales for weight monitoring at home .   Will send disciple closure note to PCP.    Egbert Garibaldi, RN, [facility] Care Management,Care Management Coordinator  [phone]- Mobile [phone]- Toll Free Main Office

## 2018-03-29 ENCOUNTER — Encounter: Payer: Self-pay | Admitting: *Deleted

## 2018-03-29 NOTE — Addendum Note (Signed)
Addended by: Egbert GaribaldiGLOVER, Liadan Guizar A on: 03/29/2018 08:24 PM   Modules accepted: Orders

## 2018-03-30 ENCOUNTER — Encounter: Payer: Self-pay | Admitting: *Deleted

## 2018-04-05 ENCOUNTER — Telehealth: Payer: Self-pay

## 2018-04-05 ENCOUNTER — Other Ambulatory Visit: Payer: Self-pay | Admitting: *Deleted

## 2018-04-05 DIAGNOSIS — Z794 Long term (current) use of insulin: Secondary | ICD-10-CM

## 2018-04-05 DIAGNOSIS — E1143 Type 2 diabetes mellitus with diabetic autonomic (poly)neuropathy: Secondary | ICD-10-CM

## 2018-04-05 NOTE — Patient Outreach (Signed)
[facility] Care Management  04/05/2018  Keitha ButteRichard Rue II 1961/08/11 [id number]   Call to PCP office to communicate patient request for being ready to attend nutrition class at recent  home visit.    Placed call to patient to update on home scales delivery anticipated for today, delay in scales being delivered from previous request.   Patient complained of having back pain, he relates it to fall that he had in May, reports that he has scheduled a chiropractic appointment for tomorrow. Patient urged to notify MD office of concerns related to back discomfort, states if not better after visit tomorrow he will,he discussed previous history of back problems.      Egbert GaribaldiKimberly Plotner, RN, [facility] Care Management,Care Management Coordinator  [phone]- Mobile [phone]- Toll Free Main Office

## 2018-04-05 NOTE — Telephone Encounter (Signed)
Kim with [facility] called today requesting a new referral for DM classes for patient.  Per Kim patient is wanting to go classes now.   

## 2018-04-08 DIAGNOSIS — J9611 Chronic respiratory failure with hypoxia: Secondary | ICD-10-CM | POA: Diagnosis not present

## 2018-04-08 DIAGNOSIS — J45909 Unspecified asthma, uncomplicated: Secondary | ICD-10-CM | POA: Diagnosis not present

## 2018-04-08 DIAGNOSIS — I10 Essential (primary) hypertension: Secondary | ICD-10-CM | POA: Diagnosis not present

## 2018-04-10 ENCOUNTER — Telehealth: Payer: Self-pay | Admitting: Urology

## 2018-04-10 MED ORDER — "NEEDLE (DISP) 18G X 1-1/2"" MISC": 1.0000 | 0 refills | Status: DC

## 2018-04-10 MED ORDER — TESTOSTERONE CYPIONATE 200 MG/ML IM SOLN: 140.0000 mg | INTRAMUSCULAR | 0 refills | Status: DC

## 2018-04-10 MED ORDER — "NEEDLE (DISP) 21G X 1-1/2"" MISC": 1.0000 | 0 refills | Status: AC

## 2018-04-10 MED ORDER — SYRINGE 2-3 ML 3 ML MISC: 1.0000 | 0 refills | Status: DC

## 2018-04-10 NOTE — Telephone Encounter (Signed)
Pt needs [facility] to send in Rx for big and small needles for testosterone injections for pt.  He is also out of Testosterone.  [facility] [place] [facility] [place].  [phone]

## 2018-04-10 NOTE — Telephone Encounter (Signed)
Printed, will fax. Needles and syringes sent to pharmacy as well.

## 2018-04-11 ENCOUNTER — Telehealth: Payer: Self-pay | Admitting: Urology

## 2018-04-11 NOTE — Telephone Encounter (Signed)
Pt LMOM very upset that his Rx for medication (name of med wasn't given) and his needles/syringes for the medication are wrong, pt would like for someone to call him to clarify "why his Rx keeps getting messed up" Please advise. Thanks.

## 2018-04-12 ENCOUNTER — Ambulatory Visit: Payer: Medicare HMO | Admitting: *Deleted

## 2018-04-12 ENCOUNTER — Telehealth: Payer: Self-pay | Admitting: Nurse Practitioner

## 2018-04-12 MED ORDER — SYRINGE 2-3 ML 3 ML MISC: 1.0000 | 0 refills | Status: AC

## 2018-04-12 MED ORDER — TESTOSTERONE CYPIONATE 200 MG/ML IM SOLN: 140.0000 mg | INTRAMUSCULAR | 0 refills | Status: DC

## 2018-04-12 MED ORDER — "NEEDLE (DISP) 21G X 1-1/2"" MISC": 2 refills | Status: AC

## 2018-04-12 MED ORDER — "NEEDLE (DISP) 18G X 1-1/2"" MISC": 1.0000 | 0 refills | Status: AC

## 2018-04-12 NOTE — Addendum Note (Signed)
Addended by: Vickki HearingHOMPSON, Taressa Rauh L on: 04/12/2018 11:16 AM   Modules accepted: Orders

## 2018-04-12 NOTE — Telephone Encounter (Signed)
Pt. Stopped by today wanting to know if you would write a prescription for Clonazepam he states he have call [facility] 3 days ago and they have not called him back. Also wanted a another place that he may go  Medication need to be called into  [facility] [place]. Pt call back # is  [phone]

## 2018-04-12 NOTE — Telephone Encounter (Signed)
Once established with [facility], he will need to continue with prescriptions from them.  He should call again.  Possibly to request an appointment.  I will not prescribe this at this time.  He can release his records to us from there if we need to prescribe as a bridge to another psychiatrist.  Also if we will take over this prescription even for short term use, we will need an office visit.    Please provide [facility] information.  First visit is always as a walk-in.  [facility], available walk-in 9am-4pm M-F [address] [place], [place] [address] Hours: 9am - 4pm (M-F, walk in available) [phone]  Other local psychiatrist: [facility] - his partner goes there and he cannot be a patient there.   [place]: [facility]  [address]    [facility]   Address: [address], [place], [place] [address] Hours: 8AM-5PM (accepts walk in to establish) Phone: [phone]

## 2018-04-14 NOTE — Telephone Encounter (Signed)
Left messgae

## 2018-04-17 NOTE — Telephone Encounter (Signed)
The pt was notified. [facility] information printed for patient. I will reach  out to [facility]. No answer. lmom to return my call.

## 2018-04-20 ENCOUNTER — Encounter: Payer: Self-pay | Admitting: Nurse Practitioner

## 2018-04-20 ENCOUNTER — Other Ambulatory Visit: Payer: Self-pay

## 2018-04-20 ENCOUNTER — Ambulatory Visit (INDEPENDENT_AMBULATORY_CARE_PROVIDER_SITE_OTHER): Payer: Medicare HMO | Admitting: Nurse Practitioner

## 2018-04-20 VITALS — BP 131/66 | HR 96 | Temp 98.3°F | Ht 76.0 in | Wt >= 6400 oz

## 2018-04-20 DIAGNOSIS — Z1159 Encounter for screening for other viral diseases: Secondary | ICD-10-CM

## 2018-04-20 DIAGNOSIS — F5104 Psychophysiologic insomnia: Secondary | ICD-10-CM

## 2018-04-20 DIAGNOSIS — E1143 Type 2 diabetes mellitus with diabetic autonomic (poly)neuropathy: Secondary | ICD-10-CM

## 2018-04-20 DIAGNOSIS — Z794 Long term (current) use of insulin: Secondary | ICD-10-CM

## 2018-04-20 DIAGNOSIS — E039 Hypothyroidism, unspecified: Secondary | ICD-10-CM

## 2018-04-20 DIAGNOSIS — S81801S Unspecified open wound, right lower leg, sequela: Secondary | ICD-10-CM

## 2018-04-20 DIAGNOSIS — K219 Gastro-esophageal reflux disease without esophagitis: Secondary | ICD-10-CM | POA: Diagnosis not present

## 2018-04-20 LAB — POCT GLYCOSYLATED HEMOGLOBIN (HGB A1C): Hemoglobin A1C: 8.2 % — AB (ref 4.0–5.6)

## 2018-04-20 MED ORDER — MELATONIN 5 MG PO TABS: 5.0000 mg | ORAL_TABLET | Freq: Every day | ORAL | 3 refills | Status: DC

## 2018-04-20 MED ORDER — GLUCERNA 1.0 CAL/CARBSTEADY PO LIQD: 1.0000 | Freq: Every day | ORAL | 1 refills | Status: DC | PRN

## 2018-04-20 NOTE — Patient Instructions (Addendum)
Philip [name],   Thank you for coming in to clinic today.  1. Current correction scale is:  blood sugar 120-150, take 4 units correction insulin in addition to your meal insulin blood sugar 151-200, take 6 units blood sugar 201-250, take 8 units blood sugar 251-300, take 10 units blood sugar 301-350, take 12 units blood sugar 351-400, take 14 units blood sugar > 400, take 14 units and call clinic.   If you do not eat a meal, only give correction scale.  - No other medication changes.   Referral to wound care.     You will be due for FASTING BLOOD WORK.  This means you should eat no food or drink after midnight.  Drink only water or coffee without cream/sugar on the morning of your lab visit. - Please go ahead and schedule a "Lab Only" visit in the morning at the clinic for lab draw in the next 7 days. - Your results will be available about 2-3 days after blood draw.  If you have set up a MyChart account, you can can log in to MyChart online to view your results and a brief explanation. Also, we can discuss your results together at your next office visit if you would like.   Please schedule a follow-up appointment with [name], AGNP. Return in about 3 months (around 07/21/2018) for diabetes.  If you have any other questions or concerns, please feel free to call the clinic or send a message through MyChart. You may also schedule an earlier appointment if necessary.  You will receive a survey after today's visit either digitally by e-mail or paper by [employer] mail. Your experiences and feedback matter to us.  Please respond so we know how we are doing as we provide care for you.   Wilhelmina McardleLauren Philopater Mucha, DNP, AGNP-BC Adult Gerontology Nurse Practitioner [facility], [id number]

## 2018-04-20 NOTE — Progress Notes (Signed)
Subjective:    Patient ID: Philip [name], male    DOB: 1961-03-26, 57 y.o.   MRN: [id number]  Philip [name] is a 57 y.o. male presenting on 04/20/2018 for Diabetes (pt states he's been having some low blood sugar)   HPI Diabetes Pt presents today for follow up of Type 2 diabetes mellitus. He is checking CBG at home, but it he does not have his log with him today.   - He  has had 2-3 episodes hypoglycemia since last visit.  When these occur he feels shaky, dizzy, clammy.  He eats regular coke or other food.  CBG returns to normal. - He otherwise denies polydipsia, polyphagia, polyuria, headaches, diaphoresis, shakiness, chills and changes in vision.   - Clinical course has been stable. - He  reports no regular exercise routine limited by breathing. - His diet is moderate in salt, moderate in fat, and high in carbohydrates. - Weight trend: fluctuating  PREVENTION: Eye exam current (within one year): yes Foot exam current (within one year): yes  Lipid/ASCVD risk reduction - on statin: yes Kidney protection - on ace or arb: yes Recent Labs    09/09/17 0938 01/11/18 0955 04/20/18 1358  HGBA1C 9.1 8.3 8.2*    Insomnia  Has RHA for tomorrow:  Has been out of clonazepam for 1 month and has not slept well.  He requests refill of clonazepam today.   Leg wound Patient also presents with right leg wound that is nonhealing.  Requests additional follow-up.  Social History   Tobacco Use  . Smoking status: Never Smoker  . Smokeless tobacco: Never Used  Substance Use Topics  . Alcohol use: Yes    Comment: occasionally  . Drug use: No    Review of Systems Per HPI unless specifically indicated above     Objective:    BP 131/66 (BP Location: Right Arm, Patient Position: Sitting, Cuff Size: Large)   Pulse 96   Temp 98.3 F (36.8 C) (Oral)   Ht 6\' 4"  (1.93 m)   Wt (!) 415 lb (188.2 kg)   BMI 50.52 kg/m   Wt Readings from Last 3 Encounters:  04/20/18 (!) 415 lb (188.2  kg)  03/16/18 (!) 423 lb (191.9 kg)  02/25/18 (!) 420 lb (190.5 kg)    Physical Exam  Constitutional: He is oriented to person, place, and time. He appears well-developed and well-nourished. No distress.  Morbidly obese  HENT:  Head: Normocephalic and atraumatic.  Neck: Normal range of motion. Neck supple. Carotid bruit is not present.  Cardiovascular: Normal rate, regular rhythm, S1 normal, S2 normal, normal heart sounds and intact distal pulses.  Pulmonary/Chest: Effort normal. No respiratory distress (has shallow breathing 2/2 body habitus). He has decreased breath sounds.  Musculoskeletal: He exhibits no edema (pedal).  Neurological: He is alert and oriented to person, place, and time.  Skin: Skin is warm and dry. Capillary refill takes less than 2 seconds. Lesion (persistent R leg wound) noted.  Psychiatric: His behavior is normal. Judgment and thought content normal. His mood appears anxious.  Vitals reviewed.   Diabetic Foot Exam - Simple   Simple Foot Form Diabetic Foot exam was performed with the following findings:  Yes   Visual Inspection See comments:  Yes Sensation Testing Pulse Check Comments No ulcerations or skin breakdown Limited sensation to great toes bilaterally PT, DP pulses +1 bilaterally Fungal nail infection with subungual debris of all toes.  Thickened and discolored nails. Bunions present bilaterally with small  callus formation at medial aspects of MTP bilaterally.      Results for orders placed or performed during the hospital encounter of 03/14/18  Testosterone  Result Value Ref Range   Testosterone 213 (L) 264 - 916 ng/dL      Assessment & Plan:   Problem List Items Addressed This Visit      Digestive   GERD (gastroesophageal reflux disease) Stable on medications without signs of bleeding and labs today to monitor for anemia.   Relevant Orders   CBC with Differential/Platelet (Completed)     Endocrine   Type 2 diabetes mellitus with  autonomic neuropathy (HCC) - Primary Improving type 2 diabetes with A1c not yet at goal of less than 8%.  Patient with hypoglycemia likely will not reach A1c goal less than 7%.  Plan: 1.  Continue nutritional supplements, but changed to Glucerna instead of Ensure. 2.  Reinforced correction scale insulin with need to dose only what is prescribed.  Reinforced also that if patient not eating a meal he only gives correction scale as he has been dosing both mealtime with correction scale even when he does not eat. 3.  Poorly healing right leg wound.  Referral to wound care placed 4.  Repeat labs today. 5.  Patient candidate for diabetic shoes with peripheral neuropathy and presence of bunions with early callus formation. 6.  Labs today 7.  Follow-up 3 months.   Relevant Medications   Nutritional Supplements (GLUCERNA 1.0 CAL/CARBSTEADY) LIQD   Other Relevant Orders   POCT glycosylated hemoglobin (Hb A1C) (Completed)   Lipid panel (Completed)   COMPLETE METABOLIC PANEL WITH GFR (Completed)   Hypothyroid Previously stable on levothyroxine.  Due for lab recheck.  Continue levothyroxine at current dose for now.   Relevant Orders   TSH (Completed)    Other Visit Diagnoses    Psychophysiological insomnia     Discussed with patient as previously addressed that no clonazepam will continue to be prescribed without psychiatry.  Discussed sleep hygiene and encouraged patient to try melatonin approximately 1 hour before bed.  Follow-up as needed.   Relevant Medications   Melatonin 5 MG TABS   Encounter for hepatitis C screening test for low risk patient     Patient without hepatitis C screening.  Low risk patient.  Add to labs today   Relevant Orders   Hepatitis C antibody (Completed)   Leg wound, right, sequela     Poorly healing leg wound obtained from injury with truck.  Referral to wound care clinic as it is not healing.  Follow-up as needed.  Patient likely also has peripheral vascular disease  and/or peripheral arterial disease.  Consider ABI/TBI in future.   Relevant Orders   Ambulatory referral to Wound Clinic      Meds ordered this encounter  Medications  . Nutritional Supplements (GLUCERNA 1.0 CAL/CARBSTEADY) LIQD    Sig: Take 1 Bottle by mouth daily as needed.    Dispense:  7110 mL    Refill:  1    Order Specific Question:   Supervising Provider    Answer:   Smitty Cords [[id number]]  . Melatonin 5 MG TABS    Sig: Take 1 tablet (5 mg total) by mouth at bedtime.    Dispense:  30 tablet    Refill:  3    Order Specific Question:   Supervising Provider    Answer:   Smitty Cords [[id number]]    Follow up plan: Return in about 3 months (around  07/21/2018) for diabetes.  Wilhelmina McardleLauren Ander Wamser, DNP, AGPCNP-BC Adult Gerontology Primary Care Nurse Practitioner [facility] 04/20/2018, 1:32 PM

## 2018-04-21 ENCOUNTER — Other Ambulatory Visit: Payer: Self-pay | Admitting: *Deleted

## 2018-04-21 ENCOUNTER — Telehealth: Payer: Self-pay | Admitting: Nurse Practitioner

## 2018-04-21 NOTE — Telephone Encounter (Signed)
Pt will be in Monday to do lab and needs to pick up a letter for DSS about his COPD for the electricity.  His call back number is [phone]

## 2018-04-21 NOTE — Patient Outreach (Signed)
[facility] Care Management  04/21/2018  Philip ButteRichard Burchfield [name] Feb 13, 1961 [id number]   RN Health Coach telephone call to patient.  [employer] compliance verified. Per patient he is getting ready to go to a Dr appointment. Patient requested that He be called again the 1st of next week around 12 am  Plan: RN will call again within 2-3 business days  Philip MaidensFrances Suvi [name] BSN RN [facility] Care Management [phone]

## 2018-04-21 NOTE — Telephone Encounter (Signed)
I spoke w/ the patient and per Lauren she instructed the patient to call and ask DSS to fax us over the form for Energy Assistance Program. The pt verbalize understanding, no questions or concerns.

## 2018-04-24 ENCOUNTER — Other Ambulatory Visit: Payer: Medicare HMO

## 2018-04-24 ENCOUNTER — Encounter: Payer: Self-pay | Admitting: *Deleted

## 2018-04-24 DIAGNOSIS — K219 Gastro-esophageal reflux disease without esophagitis: Secondary | ICD-10-CM | POA: Diagnosis not present

## 2018-04-24 DIAGNOSIS — E039 Hypothyroidism, unspecified: Secondary | ICD-10-CM | POA: Diagnosis not present

## 2018-04-24 DIAGNOSIS — Z1159 Encounter for screening for other viral diseases: Secondary | ICD-10-CM | POA: Diagnosis not present

## 2018-04-24 DIAGNOSIS — E1143 Type 2 diabetes mellitus with diabetic autonomic (poly)neuropathy: Secondary | ICD-10-CM | POA: Diagnosis not present

## 2018-04-24 DIAGNOSIS — Z794 Long term (current) use of insulin: Secondary | ICD-10-CM | POA: Diagnosis not present

## 2018-04-25 ENCOUNTER — Other Ambulatory Visit: Payer: Self-pay | Admitting: Nurse Practitioner

## 2018-04-25 DIAGNOSIS — E039 Hypothyroidism, unspecified: Secondary | ICD-10-CM

## 2018-04-25 DIAGNOSIS — E785 Hyperlipidemia, unspecified: Secondary | ICD-10-CM

## 2018-04-25 DIAGNOSIS — E1169 Type 2 diabetes mellitus with other specified complication: Secondary | ICD-10-CM

## 2018-04-25 DIAGNOSIS — E119 Type 2 diabetes mellitus without complications: Secondary | ICD-10-CM

## 2018-04-25 LAB — CBC WITH DIFFERENTIAL/PLATELET
Basophils Absolute: 78 cells/uL (ref 0–200)
Basophils Relative: 1.3 %
Eosinophils Absolute: 198 cells/uL (ref 15–500)
Eosinophils Relative: 3.3 %
HCT: 49.4 % (ref 38.5–50.0)
Hemoglobin: 15.9 g/dL (ref 13.2–17.1)
Lymphs Abs: 1770 cells/uL (ref 850–3900)
MCH: 29.6 pg (ref 27.0–33.0)
MCHC: 32.2 g/dL (ref 32.0–36.0)
MCV: 92 fL (ref 80.0–100.0)
MPV: 10.4 fL (ref 7.5–12.5)
Monocytes Relative: 13.1 %
Neutro Abs: 3168 cells/uL (ref 1500–7800)
Neutrophils Relative %: 52.8 %
Platelets: 345 10*3/uL (ref 140–400)
RBC: 5.37 10*6/uL (ref 4.20–5.80)
RDW: 13.5 % (ref 11.0–15.0)
Total Lymphocyte: 29.5 %
WBC mixed population: 786 cells/uL (ref 200–950)
WBC: 6 10*3/uL (ref 3.8–10.8)

## 2018-04-25 LAB — LIPID PANEL
Cholesterol: 169 mg/dL (ref <200)
HDL: 33 mg/dL — ABNORMAL LOW (ref >40)
LDL Cholesterol (Calc): 99 mg/dL (calc)
Non-HDL Cholesterol (Calc): 136 mg/dL (calc) — ABNORMAL HIGH (ref <130)
Total CHOL/HDL Ratio: 5.1 (calc) — ABNORMAL HIGH (ref <5.0)
Triglycerides: 242 mg/dL — ABNORMAL HIGH (ref <150)

## 2018-04-25 LAB — COMPLETE METABOLIC PANEL WITH GFR
AG Ratio: 1.6 (calc) (ref 1.0–2.5)
ALT: 40 U/L (ref 9–46)
AST: 29 U/L (ref 10–35)
Albumin: 3.8 g/dL (ref 3.6–5.1)
Alkaline phosphatase (APISO): 54 U/L (ref 40–115)
BUN: 12 mg/dL (ref 7–25)
CO2: 30 mmol/L (ref 20–32)
Calcium: 10.4 mg/dL — ABNORMAL HIGH (ref 8.6–10.3)
Chloride: 101 mmol/L (ref 98–110)
Creat: 1.14 mg/dL (ref 0.70–1.33)
GFR, Est African American: 83 mL/min/{1.73_m2} (ref >=60)
GFR, Est Non African American: 72 mL/min/{1.73_m2} (ref >=60)
Globulin: 2.4 g/dL (calc) (ref 1.9–3.7)
Glucose, Bld: 194 mg/dL — ABNORMAL HIGH (ref 65–99)
Potassium: 4.4 mmol/L (ref 3.5–5.3)
Sodium: 139 mmol/L (ref 135–146)
Total Bilirubin: 0.5 mg/dL (ref 0.2–1.2)
Total Protein: 6.2 g/dL (ref 6.1–8.1)

## 2018-04-25 LAB — HEPATITIS C ANTIBODY
Hepatitis C Ab: NONREACTIVE
SIGNAL TO CUT-OFF: 0.01 (ref <1.00)

## 2018-04-25 LAB — TSH: TSH: 5.38 m[IU]/L — ABNORMAL HIGH (ref 0.40–4.50)

## 2018-04-25 MED ORDER — ATORVASTATIN CALCIUM 10 MG PO TABS: 10.0000 mg | ORAL_TABLET | ORAL | 5 refills | Status: DC

## 2018-04-25 MED ORDER — LEVOTHYROXINE SODIUM 25 MCG PO TABS: 25.0000 ug | ORAL_TABLET | Freq: Every day | ORAL | 5 refills | Status: DC

## 2018-04-25 MED ORDER — LEVOTHYROXINE SODIUM 200 MCG PO TABS: 200.0000 ug | ORAL_TABLET | Freq: Every day | ORAL | 5 refills | Status: DC

## 2018-04-27 ENCOUNTER — Other Ambulatory Visit: Payer: Medicare HMO

## 2018-04-27 ENCOUNTER — Ambulatory Visit: Payer: Medicare HMO | Admitting: Nurse Practitioner

## 2018-04-28 ENCOUNTER — Other Ambulatory Visit: Payer: Medicare HMO

## 2018-04-30 ENCOUNTER — Other Ambulatory Visit: Payer: Self-pay | Admitting: Nurse Practitioner

## 2018-04-30 DIAGNOSIS — J301 Allergic rhinitis due to pollen: Secondary | ICD-10-CM

## 2018-05-08 DIAGNOSIS — J9611 Chronic respiratory failure with hypoxia: Secondary | ICD-10-CM | POA: Diagnosis not present

## 2018-05-08 DIAGNOSIS — J45909 Unspecified asthma, uncomplicated: Secondary | ICD-10-CM | POA: Diagnosis not present

## 2018-05-08 DIAGNOSIS — I10 Essential (primary) hypertension: Secondary | ICD-10-CM | POA: Diagnosis not present

## 2018-05-12 ENCOUNTER — Ambulatory Visit: Payer: Medicare HMO | Admitting: Dietician

## 2018-05-16 ENCOUNTER — Other Ambulatory Visit: Payer: Self-pay | Admitting: Nurse Practitioner

## 2018-05-16 ENCOUNTER — Encounter: Payer: Self-pay | Admitting: Urology

## 2018-05-16 ENCOUNTER — Ambulatory Visit (INDEPENDENT_AMBULATORY_CARE_PROVIDER_SITE_OTHER): Payer: Medicare HMO | Admitting: Urology

## 2018-05-16 ENCOUNTER — Other Ambulatory Visit: Payer: Self-pay

## 2018-05-16 VITALS — BP 147/78 | HR 80 | Ht 76.0 in | Wt >= 6400 oz

## 2018-05-16 DIAGNOSIS — E119 Type 2 diabetes mellitus without complications: Secondary | ICD-10-CM

## 2018-05-16 DIAGNOSIS — E291 Testicular hypofunction: Secondary | ICD-10-CM | POA: Diagnosis not present

## 2018-05-17 ENCOUNTER — Encounter: Payer: Self-pay | Admitting: Urology

## 2018-05-17 DIAGNOSIS — F310 Bipolar disorder, current episode hypomanic: Secondary | ICD-10-CM | POA: Diagnosis not present

## 2018-05-17 LAB — PSA: Prostate Specific Ag, Serum: 0.4 ng/mL (ref 0.0–4.0)

## 2018-05-17 LAB — HEMATOCRIT: HEMATOCRIT: 45.5 % (ref 37.5–51.0)

## 2018-05-17 LAB — TESTOSTERONE: Testosterone: 414 ng/dL (ref 264–916)

## 2018-05-17 NOTE — Progress Notes (Signed)
05/16/2018 7:32 AM   Philip [name] [birth year] [id number]  Referring provider: Galen ManilaKennedy, Lauren Renee, NP [address] [place], [place] [id number]  Chief Complaint  Patient presents with  . Hypogonadism    HPI: 57 year old male presents for follow-up of hypogonadism.  Bothersome symptoms are tiredness, fatigue, low libido and erectile dysfunction.  He had been on testosterone placement starting in 2008 and was started on injections per his request in May 2019.  At his last follow-up he complained of persistent symptoms.  He was increased to weekly injections at 140 mg.  He has noted mild improvement in his symptoms.  He has not had follow-up blood work.  He has no voiding complaints.   PMH: Past Medical History:  Diagnosis Date  . Anxiety   . Asthma   . Bipolar 1 disorder (HCC)   . COPD (chronic obstructive pulmonary disease) (HCC)   . Depression   . Diabetes mellitus without complication (HCC)   . GERD (gastroesophageal reflux disease)   . Osteoarthritis   . Thyroid disease     Surgical History: Past Surgical History:  Procedure Laterality Date  . CHOLECYSTECTOMY    . SHOULDER SURGERY Right   . UMBILICAL HERNIA REPAIR      Home Medications:  Allergies as of 05/16/2018      Reactions   Aripiprazole    Other reaction(s): Other (see comments) Unknown per patient   Duloxetine Hcl Other (See Comments)   unsure   Fenofibrate    Other reaction(s): Other (see comments) Muscle twitching   Lamotrigine Other (See Comments)   fever   Pregabalin Swelling, Other (See Comments)   Swelling and weight gain   Simvastatin    Other reaction(s): Other (see comments) Myalgia   Latex Rash   Valproic Acid Rash      Medication List        Accurate as of 05/16/18 11:59 PM. Always use your most recent med list.          2-3CC SYRINGE 3 ML Misc 1 Container by Does not apply route once a week.   aspirin EC 81 MG tablet Take 81 mg by mouth daily.   atorvastatin 10 MG  tablet Commonly known as:  LIPITOR Take 1 tablet (10 mg total) by mouth every Monday, Wednesday, and Friday at 6 PM.   B-D ULTRAFINE III SHORT PEN 31G X 8 MM Misc Generic drug:  Insulin Pen Needle Inject 1 Device into the skin 5 (five) times daily.   budesonide-formoterol 160-4.5 MCG/ACT inhaler Commonly known as:  SYMBICORT Inhale 2 puffs into the lungs 2 (two) times daily.   buPROPion 100 MG 12 hr tablet Commonly known as:  WELLBUTRIN SR TK 2 TS PO BID   citalopram 40 MG tablet Commonly known as:  CELEXA Take 40 mg by mouth daily.   clonazePAM 1 MG tablet Commonly known as:  KLONOPIN TAKE 1 TABLET BY MOUTH THREE TIMES DAILY AS NEEDED FOR ANXIETY   collagenase ointment Commonly known as:  SANTYL Apply thin layer to wound 1-2 times daily until healing has begun   diclofenac 75 MG EC tablet Commonly known as:  VOLTAREN Take 1 tablet (75 mg total) by mouth 2 (two) times daily.   diclofenac sodium 1 % Gel Commonly known as:  VOLTAREN Apply 4 g topically 4 (four) times daily.   fluticasone 50 MCG/ACT nasal spray Commonly known as:  FLONASE Place 2 sprays into both nostrils daily.   furosemide 40 MG tablet Commonly known  as:  LASIX Take 1 tablet (40 mg total) by mouth daily.   gabapentin 400 MG capsule Commonly known as:  NEURONTIN Take 2 capsules (800 mg total) by mouth 4 (four) times daily.   GLUCERNA 1.0 CAL/CARBSTEADY Liqd Take 1 Bottle by mouth daily as needed.   Insulin Glargine 300 UNIT/ML Sopn Commonly known as:  TOUJEO SOLOSTAR Inject 62 Units into the skin 2 (two) times daily.   levocetirizine 5 MG tablet Commonly known as:  XYZAL TAKE 1 TABLET(5 MG) BY MOUTH EVERY EVENING   levothyroxine 200 MCG tablet Commonly known as:  SYNTHROID, LEVOTHROID Take 1 tablet (200 mcg total) by mouth daily before breakfast.   levothyroxine 25 MCG tablet Commonly known as:  SYNTHROID, LEVOTHROID Take 1 tablet (25 mcg total) by mouth daily before breakfast.    lisinopril 40 MG tablet Commonly known as:  PRINIVIL,ZESTRIL Take 1 tablet (40 mg total) by mouth daily.   Melatonin 5 MG Tabs Take 1 tablet (5 mg total) by mouth at bedtime.   metFORMIN 500 MG 24 hr tablet Commonly known as:  GLUCOPHAGE-XR TAKE 2 TABLETS(1000 MG) BY MOUTH TWICE DAILY WITH A MEAL   multivitamin with minerals Tabs tablet Take 1 tablet by mouth daily.   multivitamin-lutein Caps capsule Take 1 capsule by mouth daily.   NEEDLE (DISP) 18 G 18G X 1-1/2" Misc 1 Container by Does not apply route once a week.   NEEDLE (DISP) 21 G 21G X 1-1/2" Misc 1 Container by Does not apply route once a week.   NEEDLE (DISP) 21 G 21G X 1-1/2" Misc For testosterone injections as directed   NOVOLOG FLEXPEN 100 UNIT/ML FlexPen Generic drug:  insulin aspart INJECT 20 UNITS UNDER THE SKIN TID BEFORE MEALS plus Sliding Scale correction up to 15 additional units per dose.   omeprazole 40 MG capsule Commonly known as:  PRILOSEC Take 1 capsule (40 mg total) by mouth 2 (two) times daily.   potassium chloride SA 20 MEQ tablet Commonly known as:  K-DUR,KLOR-CON Take 1 tablet (20 mEq total) by mouth daily.   sildenafil 20 MG tablet Commonly known as:  REVATIO 2-5 tabs 1 hour prior to intercourse   SYRINGE-NEEDLE (DISP) 3 ML 20G X 1" 3 ML Misc Commonly known as:  SAFETY SYRINGES/NEEDLE 1 mL as directed every 2 weeks   tamsulosin 0.4 MG Caps capsule Commonly known as:  FLOMAX Take 1 capsule (0.4 mg total) by mouth daily.   testosterone cypionate 200 MG/ML injection Commonly known as:  DEPOTESTOSTERONE CYPIONATE Inject 0.7 mLs (140 mg total) into the muscle once a week.   Tiotropium Bromide Monohydrate 1.25 MCG/ACT Aers Commonly known as:  SPIRIVA RESPIMAT Inhale 2 puffs into the lungs daily.   VENTOLIN HFA 108 (90 Base) MCG/ACT inhaler Generic drug:  albuterol INL 1 TO 2 PFS PO Q 4 TO 6 H PRF WHZ   VICTOZA 18 MG/3ML Sopn Generic drug:  liraglutide Inject 0.3 mLs (1.8 mg  total) into the skin daily.   VITAMIN D PO Take 1 capsule by mouth daily.   ziprasidone 20 MG capsule Commonly known as:  GEODON Take 1 capsule (20 mg total) by mouth 2 (two) times daily with a meal.       Allergies:  Allergies  Allergen Reactions  . Aripiprazole     Other reaction(s): Other (see comments) Unknown per patient  . Duloxetine Hcl Other (See Comments)    unsure  . Fenofibrate     Other reaction(s): Other (see comments) Muscle twitching  .  Lamotrigine Other (See Comments)    fever  . Pregabalin Swelling and Other (See Comments)    Swelling and weight gain  . Simvastatin     Other reaction(s): Other (see comments) Myalgia  . Latex Rash  . Valproic Acid Rash    Family History: Family History  Problem Relation Age of Onset  . Depression Mother   . Diabetes Mother   . Heart disease Mother   . Depression Father   . Lung cancer Father   . Diabetes Father   . Breast cancer Sister   . Anxiety disorder Sister   . Depression Sister   . Stroke Neg Hx     Social History:  reports that he has never smoked. He has never used smokeless tobacco. He reports that he drinks alcohol. He reports that he does not use drugs.  ROS: UROLOGY Frequent Urination?: No Hard to postpone urination?: No Burning/pain with urination?: No Get up at night to urinate?: Yes Leakage of urine?: No Urine stream starts and stops?: No Trouble starting stream?: No Do you have to strain to urinate?: No Blood in urine?: No Urinary tract infection?: No Sexually transmitted disease?: No Injury to kidneys or bladder?: No Painful intercourse?: No Weak stream?: No Erection problems?: Yes Penile pain?: No  Gastrointestinal Nausea?: No Vomiting?: No Indigestion/heartburn?: No Diarrhea?: No Constipation?: No  Constitutional Fever: No Night sweats?: No Weight loss?: No Fatigue?: Yes  Skin Skin rash/lesions?: Yes Itching?: Yes  Eyes Blurred vision?: No Double vision?:  No  Ears/Nose/Throat Sore throat?: No Sinus problems?: Yes  Hematologic/Lymphatic Swollen glands?: No Easy bruising?: No  Cardiovascular Leg swelling?: No Chest pain?: No  Respiratory Cough?: No Shortness of breath?: Yes  Endocrine Excessive thirst?: No  Musculoskeletal Back pain?: Yes Joint pain?: Yes  Neurological Headaches?: Yes Dizziness?: Yes  Psychologic Depression?: Yes Anxiety?: Yes  Physical Exam: BP (!) 147/78 (BP Location: Left Arm, Patient Position: Sitting, Cuff Size: Large)   Pulse 80   Ht 6\' 4"  (1.93 m)   Wt (!) 415 lb (188.2 kg)   BMI 50.52 kg/m   Constitutional:  Alert and oriented, No acute distress. HEENT: Rachel AT, moist mucus membranes.  Trachea midline, no masses. Cardiovascular: No clubbing, cyanosis, or edema. Respiratory: Normal respiratory effort, no increased work of breathing. GI: Abdomen is soft, nontender, nondistended, no abdominal masses GU: No CVA tenderness Lymph: No cervical or inguinal lymphadenopathy. Skin: No rashes, bruises or suspicious lesions. Neurologic: Grossly intact, no focal deficits, moving all 4 extremities. Psychiatric: Normal mood and affect.   Assessment & Plan:   57 year old male with hypogonadism on TRT.  Testosterone, PSA and hematocrit were drawn.  He will be notified with results and further recommendations regarding dosing and follow-up.   Riki Altes, MD  [facility] [address] [place], [place] [address] [phone]

## 2018-05-18 ENCOUNTER — Other Ambulatory Visit: Payer: Self-pay | Admitting: Nurse Practitioner

## 2018-05-18 ENCOUNTER — Encounter: Payer: Self-pay | Admitting: Nurse Practitioner

## 2018-05-18 ENCOUNTER — Other Ambulatory Visit: Payer: Self-pay

## 2018-05-18 ENCOUNTER — Ambulatory Visit (INDEPENDENT_AMBULATORY_CARE_PROVIDER_SITE_OTHER): Payer: Medicare HMO | Admitting: Nurse Practitioner

## 2018-05-18 VITALS — BP 150/73 | HR 98 | Temp 97.6°F | Ht 76.0 in | Wt >= 6400 oz

## 2018-05-18 DIAGNOSIS — M5442 Lumbago with sciatica, left side: Secondary | ICD-10-CM

## 2018-05-18 DIAGNOSIS — F419 Anxiety disorder, unspecified: Secondary | ICD-10-CM

## 2018-05-18 DIAGNOSIS — F5104 Psychophysiologic insomnia: Secondary | ICD-10-CM

## 2018-05-18 DIAGNOSIS — G8929 Other chronic pain: Secondary | ICD-10-CM

## 2018-05-18 MED ORDER — BACLOFEN 10 MG PO TABS: 5.0000 mg | ORAL_TABLET | Freq: Every evening | ORAL | 0 refills | Status: DC | PRN

## 2018-05-18 NOTE — Patient Instructions (Addendum)
Philip [name],   Thank you for coming in to clinic today.  1. STOP clonazepam  2. START for back pain and muscle spasm: - Take baclofen 10 mg at bedtime as needed for moderate back pain or spasm.  Please schedule a follow-up appointment with [name], AGNP. Return if symptoms worsen or fail to improve.  If you have any other questions or concerns, please feel free to call the clinic or send a message through MyChart. You may also schedule an earlier appointment if necessary.  You will receive a survey after today's visit either digitally by e-mail or paper by [employer]. Your experiences and feedback matter to [place].  Please respond so we know how we are doing as we provide care for you.   [name], DNP, AGNP-BC Adult Gerontology Nurse Practitioner [facility], [facility]  Low Back Pain Exercises See other page with pictures of each exercise.  Start with 1 or 2 of these exercises that you are most comfortable with. Do not do any exercises that cause you significant worsening pain. Some of these may cause some "stretching soreness" but it should go away after you stop the exercise, and get better over time. Gradually increase up to 3-4 exercises as tolerated.  Standing hamstring stretch: Place the heel of your leg on a stool about 15 inches high. Keep your knee straight. Lean forward, bending at the hips until you feel a mild stretch in the back of your thigh. Make sure you do not roll your shoulders and bend at the waist when doing this or you will stretch your lower back instead. Hold the stretch for 15 to 30 seconds. Repeat 3 times. Repeat the same stretch on your other leg.  Cat and camel: Get down on your hands and knees. Let your stomach sag, allowing your back to curve downward. Hold this position for 5 seconds. Then arch your back and hold for 5 seconds. Do 3 sets of 10.  Quadriped Arm/Leg Raises: Get down on your hands and knees. Tighten your abdominal  muscles to stiffen your spine. While keeping your abdominals tight, raise one arm and the opposite leg away from you. Hold this position for 5 seconds. Lower your arm and leg slowly and alternate sides. Do this 10 times on each side.  Pelvic tilt: Lie on your back with your knees bent and your feet flat on the floor. Tighten your abdominal muscles and push your lower back into the floor. Hold this position for 5 seconds, then relax. Do 3 sets of 10.  Partial curl: Lie on your back with your knees bent and your feet flat on the floor. Tighten your stomach muscles and flatten your back against the floor. Tuck your chin to your chest. With your hands stretched out in front of you, curl your upper body forward until your shoulders clear the floor. Hold this position for 3 seconds. Don't hold your breath. It helps to breathe out as you lift your shoulders up. Relax. Repeat 10 times. Build to 3 sets of 10. To challenge yourself, clasp your hands behind your head and keep your elbows out to the side.  Lower trunk rotation: Lie on your back with your knees bent and your feet flat on the floor. Tighten your abdominal muscles and push your lower back into the floor. Keeping your shoulders down flat, gently rotate your legs to one side, then the other as far as you can. Repeat 10 to 20 times.  Single  knee to chest stretch: Lie on your back with your legs straight out in front of you. Bring one knee up to your chest and grasp the back of your thigh. Pull your knee toward your chest, stretching your buttock muscle. Hold this position for 15 to 30 seconds and return to the starting position. Repeat 3 times on each side.  Double knee to chest: Lie on your back with your knees bent and your feet flat on the floor. Tighten your abdominal muscles and push your lower back into the floor. Pull both knees up to your chest. Hold for 5 seconds and repeat 10 to 20 times.

## 2018-05-18 NOTE — Progress Notes (Signed)
Subjective:    Patient ID: Philip [name], male    DOB: 10/02/1961, 57 y.o.   MRN: [id number]  Philip [name] is a 57 y.o. male presenting on 05/18/2018 for Anxiety (pt states it 2 mths out on seen a Provider, he requesting a refill on clonazepam he been off the mediciation x 2 mths )   HPI Anxiety/Insomnia Patient has been off clonazepam as he has had none x 2 months.  Is scheduled in 2 months for [facility] appointment.  Currently is taking: Wellbutrin 100 1 tab bid. Geodon 20 mg 1 tab bid. Clonazepam 1 mg tid prn anxiety. - [facility] - no return of phone calls and was difficult to keep/reschedule appointments  Patient notes that his clonazepam helps sleep, panic attacks.  Sleep is worst symptom without clonazepam. - Last night night slept well for first time in 2 months. Last panic attack occurred Sunday night.  Shortness of breath - Oxygen is helping breathing, but is unable to help calm down.  With return of phone - financial stress phone calls are increasing stress.  "Im a worry wart" - Low back pain is biggest cause of insomnia.  GAD 7 : Generalized Anxiety Score 09/09/2017 07/05/2017  Nervous, Anxious, on Edge 1 3  Control/stop worrying 1 3  Worry too much - different things 1 3  Trouble relaxing 0 2  Restless 0 2  Easily annoyed or irritable 1 3  Afraid - awful might happen 1 3  Total GAD 7 Score 5 19  Anxiety Difficulty Not difficult at all Not difficult at all     Depression screen [facility] 2/9 12/06/2017 11/23/2017 11/01/2017 10/31/2017 09/09/2017  Decreased Interest 1 1 1 1 1   Down, Depressed, Hopeless 3 0 1 1 1   PHQ - 2 Score 4 1 2 2 2   Altered sleeping 3 - 3 1 1   Tired, decreased energy 2 - 2 1 0  Change in appetite 2 - 1 3 0  Feeling bad or failure about yourself  1 - 1 1 1   Trouble concentrating 0 - 0 0 0  Moving slowly or fidgety/restless 0 - 0 0 0  Suicidal thoughts 0 - 0 0 0  PHQ-9 Score 12 - 9 8 4   Difficult doing work/chores Somewhat difficult - Somewhat  difficult Somewhat difficult Not difficult at all     Social History   Tobacco Use  . Smoking status: Never Smoker  . Smokeless tobacco: Never Used  Substance Use Topics  . Alcohol use: Yes    Comment: occasionally  . Drug use: No    Review of Systems Per HPI unless specifically indicated above     Objective:    BP (!) 150/73 (BP Location: Left Arm, Patient Position: Sitting, Cuff Size: Large)   Pulse 98   Temp 97.6 F (36.4 C) (Oral)   Ht 6\' 4"  (1.93 m)   Wt (!) 423 lb (191.9 kg)   BMI 51.49 kg/m   Wt Readings from Last 3 Encounters:  05/18/18 (!) 423 lb (191.9 kg)  05/16/18 (!) 415 lb (188.2 kg)  04/20/18 (!) 415 lb (188.2 kg)    Physical Exam  Constitutional: He is oriented to person, place, and time. He appears well-developed and well-nourished. No distress.  Morbidly obese  HENT:  Head: Normocephalic and atraumatic.  Neurological: He is alert and oriented to person, place, and time.  Skin: Skin is warm and dry.  Psychiatric: He has a normal mood and affect. His behavior is normal.  Vitals reviewed.  Results for orders placed or performed in visit on 05/16/18  PSA  Result Value Ref Range   Prostate Specific Ag, Serum 0.4 0.0 - 4.0 ng/mL  Testosterone  Result Value Ref Range   Testosterone 414 264 - 916 ng/dL  Hematocrit  Result Value Ref Range   Hematocrit 45.5 37.5 - 51.0 %      Assessment & Plan:   Problem List Items Addressed This Visit    None    Visit Diagnoses    Anxiety    -  Primary   Psychophysiologic insomnia       Chronic bilateral low back pain with left-sided sciatica        Patient with worsening and currently moderate anxiety, more poor control from past.   Patient now off clonazepam and had no withdrawal,  Suffering from insomnia most off this med.  Insomnia complicated by low back pain with sciatica.  Plan: 1. Discussed clonazepam side effects and strong preference to stay off.  Patient agrees 2. Treat pain first for insomnia    - START baclofen 5-10 mg at bedtime for spasm. - START low back pain exercises 3. Can consider future trazodone, but prefer to defer this to psychiatry. 4. Encouraged patient to continue being persistent to see [facility] or [facility] for psychiatry management. 5. Followup for back pain, insomnia, anxiety as needed.   Meds ordered this encounter  Medications  . baclofen (LIORESAL) 10 MG tablet    Sig: Take 0.5-1 tablets (5-10 mg total) by mouth at bedtime as needed for muscle spasms.    Dispense:  30 each    Refill:  0    Order Specific Question:   Supervising Provider    Answer:   Smitty CordsKARAMALEGOS, ALEXANDER J [[id number]]    Follow up plan: Return if symptoms worsen or fail to improve.  Philip McardleLauren Rayaan Lorah, DNP, AGPCNP-BC Adult Gerontology Primary Care Nurse Practitioner [facility] 05/18/2018, 1:31 PM

## 2018-05-22 ENCOUNTER — Other Ambulatory Visit: Payer: Self-pay | Admitting: Nurse Practitioner

## 2018-05-22 ENCOUNTER — Telehealth: Payer: Self-pay

## 2018-05-22 DIAGNOSIS — Z87898 Personal history of other specified conditions: Secondary | ICD-10-CM

## 2018-05-22 DIAGNOSIS — Z794 Long term (current) use of insulin: Secondary | ICD-10-CM

## 2018-05-22 DIAGNOSIS — E1143 Type 2 diabetes mellitus with diabetic autonomic (poly)neuropathy: Secondary | ICD-10-CM

## 2018-05-22 DIAGNOSIS — I872 Venous insufficiency (chronic) (peripheral): Secondary | ICD-10-CM

## 2018-05-22 NOTE — Telephone Encounter (Signed)
-----   Message from Riki AltesScott C Stoioff, MD sent at 05/21/2018  9:46 AM EDT ----- Testosterone level looks much better at 414.  PSA stable at 0.4 and hematocrit was normal.  Follow-up 6 months for testosterone level and hematocrit and 1 year for office visit.

## 2018-05-22 NOTE — Telephone Encounter (Signed)
Patient notified  Please schedule for 6 month lab visit and 1 year follow up thanks

## 2018-05-25 ENCOUNTER — Telehealth: Payer: Self-pay | Admitting: Urology

## 2018-05-25 ENCOUNTER — Telehealth: Payer: Self-pay

## 2018-05-25 ENCOUNTER — Other Ambulatory Visit: Payer: Self-pay | Admitting: Family Medicine

## 2018-05-25 DIAGNOSIS — E119 Type 2 diabetes mellitus without complications: Secondary | ICD-10-CM

## 2018-05-25 MED ORDER — NOVOLOG FLEXPEN 100 UNIT/ML ~~LOC~~ SOPN: PEN_INJECTOR | SUBCUTANEOUS | 6 refills | Status: DC

## 2018-05-25 MED ORDER — TAMSULOSIN HCL 0.4 MG PO CAPS: 0.4000 mg | ORAL_CAPSULE | Freq: Every day | ORAL | 1 refills | Status: DC

## 2018-05-25 NOTE — Telephone Encounter (Signed)
App made and mailed to patient ° °Philip [name] °

## 2018-05-25 NOTE — Telephone Encounter (Signed)
Pt reports  having intermittent low blood sugar reading ranging in the 60's. He states his blood sugar today is 65 and that he's feeling nausea and lightheaded. He does admit not eating anything prior to the reading. He also had not taking his insulin yet. He drunk some coffee and juice after checking his blood in attempt to get his blood sugar up. The pt states he's been having the lightheadedness and nausea spells quite frequently.

## 2018-05-25 NOTE — Telephone Encounter (Signed)
Patient returned call after having snack today.  Patient was feeling shaky with low blood sugar.  Patient drank 2 cups orange juice and one coffee with sugar.  His repeat blood sugar was 217.  He reports his low blood sugars are occurring more frequently.    Encouraged regular meal times to prevent lows.   - Reduce meal time insulin from 20 units with meals to 16 units plus correction scale with meals.  Patient asked about eating lunch.  Patient may eat and give meal coverage dose only without correction.  Patient verbalizes understanding.

## 2018-06-02 ENCOUNTER — Other Ambulatory Visit: Payer: Self-pay | Admitting: Nurse Practitioner

## 2018-06-02 DIAGNOSIS — I872 Venous insufficiency (chronic) (peripheral): Secondary | ICD-10-CM

## 2018-06-02 DIAGNOSIS — S81809D Unspecified open wound, unspecified lower leg, subsequent encounter: Secondary | ICD-10-CM

## 2018-06-02 DIAGNOSIS — Z87898 Personal history of other specified conditions: Secondary | ICD-10-CM

## 2018-06-08 DIAGNOSIS — I10 Essential (primary) hypertension: Secondary | ICD-10-CM | POA: Diagnosis not present

## 2018-06-08 DIAGNOSIS — J45909 Unspecified asthma, uncomplicated: Secondary | ICD-10-CM | POA: Diagnosis not present

## 2018-06-08 DIAGNOSIS — J9611 Chronic respiratory failure with hypoxia: Secondary | ICD-10-CM | POA: Diagnosis not present

## 2018-06-12 NOTE — Telephone Encounter (Signed)
This encounter was created in error - please disregard.

## 2018-06-13 ENCOUNTER — Other Ambulatory Visit: Payer: Self-pay | Admitting: Nurse Practitioner

## 2018-06-13 DIAGNOSIS — M5442 Lumbago with sciatica, left side: Principal | ICD-10-CM

## 2018-06-13 DIAGNOSIS — G8929 Other chronic pain: Secondary | ICD-10-CM

## 2018-06-15 ENCOUNTER — Other Ambulatory Visit: Payer: Self-pay | Admitting: Nurse Practitioner

## 2018-06-15 DIAGNOSIS — J301 Allergic rhinitis due to pollen: Secondary | ICD-10-CM

## 2018-06-16 ENCOUNTER — Ambulatory Visit (INDEPENDENT_AMBULATORY_CARE_PROVIDER_SITE_OTHER): Payer: Medicare HMO

## 2018-06-16 ENCOUNTER — Other Ambulatory Visit: Payer: Self-pay | Admitting: Nurse Practitioner

## 2018-06-16 DIAGNOSIS — S81809D Unspecified open wound, unspecified lower leg, subsequent encounter: Secondary | ICD-10-CM

## 2018-06-16 DIAGNOSIS — I70213 Atherosclerosis of native arteries of extremities with intermittent claudication, bilateral legs: Secondary | ICD-10-CM | POA: Diagnosis not present

## 2018-06-16 DIAGNOSIS — I872 Venous insufficiency (chronic) (peripheral): Secondary | ICD-10-CM

## 2018-06-16 DIAGNOSIS — S81809S Unspecified open wound, unspecified lower leg, sequela: Secondary | ICD-10-CM | POA: Diagnosis not present

## 2018-06-16 DIAGNOSIS — Z87898 Personal history of other specified conditions: Secondary | ICD-10-CM

## 2018-06-20 DIAGNOSIS — F310 Bipolar disorder, current episode hypomanic: Secondary | ICD-10-CM | POA: Diagnosis not present

## 2018-06-21 ENCOUNTER — Telehealth: Payer: Self-pay | Admitting: Nurse Practitioner

## 2018-06-21 ENCOUNTER — Other Ambulatory Visit: Payer: Self-pay

## 2018-06-21 ENCOUNTER — Other Ambulatory Visit: Payer: Self-pay | Admitting: *Deleted

## 2018-06-21 DIAGNOSIS — M5442 Lumbago with sciatica, left side: Principal | ICD-10-CM

## 2018-06-21 DIAGNOSIS — E119 Type 2 diabetes mellitus without complications: Secondary | ICD-10-CM

## 2018-06-21 DIAGNOSIS — G8929 Other chronic pain: Secondary | ICD-10-CM

## 2018-06-21 MED ORDER — TIZANIDINE HCL 4 MG PO TABS: 4.0000 mg | ORAL_TABLET | Freq: Every day | ORAL | 1 refills | Status: DC

## 2018-06-21 MED ORDER — NOVOLOG FLEXPEN 100 UNIT/ML ~~LOC~~ SOPN: PEN_INJECTOR | SUBCUTANEOUS | 3 refills | Status: DC

## 2018-06-21 NOTE — Patient Outreach (Addendum)
[facility]) Care Management  06/21/2018  [name] 1960-12-28 [id number]   RN Health Coach telephone call to patient.  [employer] compliance verified.  Per patient he had not checked his blood sugar today because he had woke up late. Per patient it usually runs up to 200 but not over. RN started discussing the A1C and the fasting blood sugar. Patient stated that he had not slept in over a month. Per patient his clonazepam got mixed up and he didn't get the full amount when he refilled it.  Then it was stopped. Patient stated that it was for his anxiety and sleep. Patient then stated that he would not work on the diabetes without his medication because he couldn't sleep. Patient told RN Health Coach that he wasn't going to work on his A1C. He needed his medication and due to the Opioid problem no one would listen or order it for him. RN explained to patient to call and talk with his therapist. Patient stated he had and the earliest he could get was the [address].  Per patient he just went to his Dr office and they had put him on a muscle relaxer and melatonin and that don't work. RN suggested that he call the Dr office and make them aware.  Plan: Case closure due to Patient is refusing to participate  Patient and Dr sent a closure letter  Gean Maidens BSN RN [facility] Care Management [phone]

## 2018-06-21 NOTE — Telephone Encounter (Signed)
Pt has not been sleeping good for 3 months and he is upset about not getting his insulin right.  Please call [phone]

## 2018-06-21 NOTE — Telephone Encounter (Signed)
Baclofen not helping significantly with muscle spasms at night.  Too much medicine during day.    Change to tizanidine 4 mg one tablet at bedtime.   Followup 10/17 as scheduled.

## 2018-06-21 NOTE — Telephone Encounter (Signed)
Pt called complaining that he's running out of his  Novolog before his refill date. His prescription needs to be change. His last prescription sent over was for Novolog 16 units daily, but he's prescriptions was changed to INJECT 20 UNITS UNDER THE SKIN TID BEFORE MEALS plus Sliding Scale correction up to 15 additional units per dose. Please send this prescription over to [facility].    He's also complaining of insomnia since 04/07/2018 since stopping his Clonazepam, the patient states that the muscle relaxer is not helping with this issue. He states if something isn't done soon he's going to just give up and stop taking all his medications. He states he cannot function without his clonazepam.

## 2018-06-29 ENCOUNTER — Telehealth: Payer: Self-pay | Admitting: Nurse Practitioner

## 2018-06-29 NOTE — Telephone Encounter (Signed)
Pt asked about gastric bypass surgery [phone]

## 2018-06-30 NOTE — Telephone Encounter (Signed)
This request is something that will require a detailed discussion best saved for an office visit.  Please ask patient to discuss this at his next visit.

## 2018-07-02 ENCOUNTER — Other Ambulatory Visit: Payer: Self-pay | Admitting: Nurse Practitioner

## 2018-07-02 DIAGNOSIS — M150 Primary generalized (osteo)arthritis: Principal | ICD-10-CM

## 2018-07-02 DIAGNOSIS — M159 Polyosteoarthritis, unspecified: Secondary | ICD-10-CM

## 2018-07-04 ENCOUNTER — Telehealth: Payer: Self-pay | Admitting: Nurse Practitioner

## 2018-07-04 NOTE — Telephone Encounter (Signed)
Consistent with prior advice.  Will address at next appointment.

## 2018-07-04 NOTE — Telephone Encounter (Signed)
Pt called to follow up about a referral for gastric bypass? Informed pt that to have a referral he must be seen to have some documentation also checked previous telephone encounter and advise to discuss thess issues at upcoming October appointment.

## 2018-07-07 DIAGNOSIS — F331 Major depressive disorder, recurrent, moderate: Secondary | ICD-10-CM | POA: Diagnosis not present

## 2018-07-09 DIAGNOSIS — J45909 Unspecified asthma, uncomplicated: Secondary | ICD-10-CM | POA: Diagnosis not present

## 2018-07-09 DIAGNOSIS — J9611 Chronic respiratory failure with hypoxia: Secondary | ICD-10-CM | POA: Diagnosis not present

## 2018-07-09 DIAGNOSIS — I10 Essential (primary) hypertension: Secondary | ICD-10-CM | POA: Diagnosis not present

## 2018-07-10 ENCOUNTER — Telehealth: Payer: Self-pay

## 2018-07-10 NOTE — Telephone Encounter (Signed)
Shelton from [facility] called and needs office notes from July 2019 and order to get this patient DM shoes.  He already has the shoes in the store, but has to have the notes and order before he can give them to patient.  Please advise  

## 2018-07-20 ENCOUNTER — Encounter: Payer: Self-pay | Admitting: Nurse Practitioner

## 2018-07-21 DIAGNOSIS — E1143 Type 2 diabetes mellitus with diabetic autonomic (poly)neuropathy: Secondary | ICD-10-CM | POA: Diagnosis not present

## 2018-07-21 DIAGNOSIS — M2010 Hallux valgus (acquired), unspecified foot: Secondary | ICD-10-CM | POA: Diagnosis not present

## 2018-07-21 NOTE — Telephone Encounter (Signed)
Completed and closing. 

## 2018-07-21 NOTE — Telephone Encounter (Signed)
I wanted to follow up on this for the patient. This message has been open for 11 days.  Has this been completed, so this message can be closed?  

## 2018-07-27 ENCOUNTER — Encounter: Payer: Self-pay | Admitting: Nurse Practitioner

## 2018-07-27 ENCOUNTER — Other Ambulatory Visit: Payer: Self-pay

## 2018-07-27 ENCOUNTER — Ambulatory Visit (INDEPENDENT_AMBULATORY_CARE_PROVIDER_SITE_OTHER): Payer: Medicare HMO | Admitting: Nurse Practitioner

## 2018-07-27 VITALS — BP 126/66 | HR 71 | Temp 98.3°F | Ht 76.0 in | Wt >= 6400 oz

## 2018-07-27 DIAGNOSIS — Z23 Encounter for immunization: Secondary | ICD-10-CM

## 2018-07-27 DIAGNOSIS — E662 Morbid (severe) obesity with alveolar hypoventilation: Secondary | ICD-10-CM

## 2018-07-27 DIAGNOSIS — Z6841 Body Mass Index (BMI) 40.0 and over, adult: Secondary | ICD-10-CM

## 2018-07-27 DIAGNOSIS — R0902 Hypoxemia: Secondary | ICD-10-CM

## 2018-07-27 DIAGNOSIS — Z794 Long term (current) use of insulin: Secondary | ICD-10-CM | POA: Diagnosis not present

## 2018-07-27 DIAGNOSIS — E1143 Type 2 diabetes mellitus with diabetic autonomic (poly)neuropathy: Secondary | ICD-10-CM

## 2018-07-27 LAB — POCT GLYCOSYLATED HEMOGLOBIN (HGB A1C): Hemoglobin A1C: 8.9 % — AB (ref 4.0–5.6)

## 2018-07-27 NOTE — Progress Notes (Signed)
Subjective:    Patient ID: Philip [name], male    DOB: January 02, 1961, 57 y.o.   MRN: [id number]  Philip [name] is a 57 y.o. male presenting on 07/27/2018 for Diabetes   HPI Diabetes  Pt presents today for follow up of Type 2 diabetes mellitus. He is checking fasting am CBG at home with a range of 78-300, usually 100-150.  300 was very rare and x 1 after a meal - Current diabetic medications include: Toujeo 72 units twice daily, novolog with sliding scale with meal coverage, metformin XR 1,000 mg bid, Victoza 1.8 mg once daily. - He is symptomatic with neuropathy.  - He denies polydipsia, polyphagia, polyuria, headaches, diaphoresis, shakiness, chills and changes in vision.   - Clinical course has been stable, but remains uncontrolled. - He  reports no regular exercise routine. - His diet is high in salt, moderate in fat, and high in carbohydrates. - Weight trend: increasing steadily  PREVENTION: Eye exam current (within one year): yes Foot exam current (within one year): yes  Lipid/ASCVD risk reduction - on statin: yes Kidney protection - on ace or arb: yes Recent Labs    09/09/17 0938 01/11/18 0955 04/20/18 1358  HGBA1C 9.1 8.3 8.2*    Gastric Bypass/Morbid Obesity He has gone through classes/counseling a couple of years ago.  Feels it will be more financially accessible now with insurance coverage.  He has thought about this for a long time and believes it may help him significantly to improve his comorbidities.  Is ready to meet with a specialist to discuss surgical options and next steps prior to surgery.  Social History   Tobacco Use  . Smoking status: Never Smoker  . Smokeless tobacco: Never Used  Substance Use Topics  . Alcohol use: Yes    Comment: occasionally  . Drug use: No    Review of Systems Per HPI unless specifically indicated above     Objective:    BP 126/66   Pulse 71   Temp 98.3 F (36.8 C) (Oral)   Ht 6\' 4"  (1.93 m)   Wt (!) 425 lb 8  oz (193 kg)   BMI 51.79 kg/m   6- minute walk test: Patient started at rest with SpO2 of 95%, with drop to 91% after 6 minute walk test and recovered quickly.  Wt Readings from Last 3 Encounters:  07/27/18 (!) 425 lb 8 oz (193 kg)  05/18/18 (!) 423 lb (191.9 kg)  05/16/18 (!) 415 lb (188.2 kg)    Physical Exam  Constitutional: He is oriented to person, place, and time. He appears well-developed and well-nourished. No distress.  Morbid Obesity  HENT:  Head: Normocephalic and atraumatic.  Neck: Normal range of motion. Neck supple. Carotid bruit is not present. No thyromegaly present.  Cardiovascular: Normal rate, regular rhythm, S1 normal, S2 normal, normal heart sounds and intact distal pulses.  Pulmonary/Chest: Breath sounds normal. Accessory muscle usage (body habitus makes it difficult for pt to get breath in and uses some accessory muscles) present. No respiratory distress. He has no decreased breath sounds. He has no wheezes. He has no rhonchi. He has no rales.  Abdominal: Soft. Bowel sounds are normal.  Musculoskeletal: He exhibits edema (pedal +1 edema non-pitting).  Neurological: He is alert and oriented to person, place, and time.  Skin: Skin is warm and dry. Capillary refill takes less than 2 seconds. Rash (Continues with stable appearance of stasis dermatitis BLE) noted.  Psychiatric: He has a normal mood  and affect. His behavior is normal. Judgment and thought content normal.  Vitals reviewed.   Results for orders placed or performed in visit on 05/16/18  PSA  Result Value Ref Range   Prostate Specific Ag, Serum 0.4 0.0 - 4.0 ng/mL  Testosterone  Result Value Ref Range   Testosterone 414 264 - 916 ng/dL  Hematocrit  Result Value Ref Range   Hematocrit 45.5 37.5 - 51.0 %      Assessment & Plan:   Problem List Items Addressed This Visit      Endocrine   Type 2 diabetes mellitus with autonomic neuropathy (HCC) - Primary UncontrolledDM with A1c 8.2% stable from 3  months ago and goal A1c < 7.0%. - Complications - hyperlipidemia, morbid obesity, peripheral neuropathy and hyperglycemia.  Plan:  1. Continue current therapy: - Reinforced lifestyle management as next steps given patient on high amount of insulin already   2. Encourage improved lifestyle: - low carb/low glycemic diet reinforced prior education - Increase physical activity to 30 minutes most days of the week.  Explained that increased physical activity increases body's use of sugar for energy. 3. Check fasting am CBG and bring log to next visit for review 4. Continue ACEi and Statin 5. Referral to endocrinology  6. Follow-up prn    Relevant Orders   POCT glycosylated hemoglobin (Hb A1C) (Completed)   Ambulatory referral to Endocrinology   COMPLETE METABOLIC PANEL WITH GFR   Lipid panel     Other   Morbid obesity with body mass index (BMI) of 50.0 to 59.9 in adult [facility]) Patient with morbid obesity complicating several chronic conditions including hypoventilation, diabetes.  Patient desires to discuss bariatric surgery. - Referral placed to surgery - follow-up prn.    Other Visit Diagnoses    Needs flu shot       Relevant Orders   Flu Vaccine QUAD 6+ mos PF IM (Fluarix Quad PF) (Completed)   Hypoxia       Relevant Orders   Ambulatory referral to Pulmonology   6 minute walk   For home use only DME oxygen   Pulse oximetry, overnight   Hypoventilation associated with obesity syndrome (HCC)     Patient with hypoventilation and use of home oxygen at this time. Patient complains of worsening breathing and may benefit from future BiPAP/CPAP or other device as appropriate.  Plan: 1. 6 minute walk test normal in office 2. Will need overnight oximetry to support use of oxygen ongoing. 3. Referral pulmonology for additional evaluation. 4. Will work toward recertification of home oxygen. 5. Follow-up 6 months    Relevant Orders   Ambulatory referral to Pulmonology   6 minute walk    For home use only DME oxygen   Pulse oximetry, overnight      Follow up plan: Return in about 3 months (around 10/27/2018) for diabetes.  [name], DNP, AGPCNP-BC Adult Gerontology Primary Care Nurse Practitioner [facility] 07/27/2018, 2:27 PM

## 2018-07-27 NOTE — Patient Instructions (Addendum)
[name],   Thank you for coming in to clinic today.  1. No medication changes today.  Focus on carb counting again.   - Referral Endocrinology  2. Referral pulmonology and for oxygen to [facility]  3. You will be due for FASTING BLOOD WORK.  This means you should eat no food or drink after midnight.  Drink only water or coffee without cream/sugar on the morning of your lab visit. - Please go ahead and schedule a "Lab Only" visit in the morning at the clinic for lab draw in the next 7 days. - Your results will be available about 2-3 days after blood draw.  If you have set up a MyChart account, you can can log in to MyChart online to view your results and a brief explanation. Also, we can discuss your results together at your next office visit if you would like.   4. Referral to bariatric surgery.  They will call you.  Please schedule a follow-up appointment with [name], AGNP. Return in about 3 months (around 10/27/2018) for diabetes.  If you have any other questions or concerns, please feel free to call the clinic or send a message through MyChart. You may also schedule an earlier appointment if necessary.  You will receive a survey after today's visit either digitally by e-mail or paper by [employer]. Your experiences and feedback matter to [place].  Please respond so we know how we are doing as we provide care for you.   [name], DNP, AGNP-BC Adult Gerontology Nurse Practitioner [facility], [facility]

## 2018-08-07 ENCOUNTER — Telehealth: Payer: Self-pay | Admitting: Nurse Practitioner

## 2018-08-07 DIAGNOSIS — E1143 Type 2 diabetes mellitus with diabetic autonomic (poly)neuropathy: Secondary | ICD-10-CM

## 2018-08-07 DIAGNOSIS — I872 Venous insufficiency (chronic) (peripheral): Secondary | ICD-10-CM

## 2018-08-07 DIAGNOSIS — Z794 Long term (current) use of insulin: Secondary | ICD-10-CM

## 2018-08-07 DIAGNOSIS — Z6841 Body Mass Index (BMI) 40.0 and over, adult: Secondary | ICD-10-CM

## 2018-08-07 NOTE — Telephone Encounter (Signed)
Pt needs a referral to Dr. Alberteen Spindle to have toe nails cut and asked about gastric bypass surgery [phone]

## 2018-08-08 DIAGNOSIS — J9611 Chronic respiratory failure with hypoxia: Secondary | ICD-10-CM | POA: Diagnosis not present

## 2018-08-08 DIAGNOSIS — I10 Essential (primary) hypertension: Secondary | ICD-10-CM | POA: Diagnosis not present

## 2018-08-08 DIAGNOSIS — J45909 Unspecified asthma, uncomplicated: Secondary | ICD-10-CM | POA: Diagnosis not present

## 2018-08-08 NOTE — Addendum Note (Signed)
Addended by: Vernard Gambles on: 08/08/2018 05:23 PM   Modules accepted: Orders

## 2018-08-11 ENCOUNTER — Institutional Professional Consult (permissible substitution): Payer: Medicare HMO | Admitting: Internal Medicine

## 2018-08-11 ENCOUNTER — Encounter: Payer: Self-pay | Admitting: Nurse Practitioner

## 2018-08-11 DIAGNOSIS — E662 Morbid (severe) obesity with alveolar hypoventilation: Secondary | ICD-10-CM | POA: Insufficient documentation

## 2018-08-14 ENCOUNTER — Other Ambulatory Visit: Payer: Self-pay | Admitting: Nurse Practitioner

## 2018-08-14 DIAGNOSIS — G8929 Other chronic pain: Secondary | ICD-10-CM

## 2018-08-14 DIAGNOSIS — M5442 Lumbago with sciatica, left side: Principal | ICD-10-CM

## 2018-08-16 ENCOUNTER — Telehealth: Payer: Self-pay

## 2018-08-16 NOTE — Telephone Encounter (Signed)
-----   Message from Galen Manila, NP sent at 08/16/2018  1:35 PM EST ----- Can you followup to see if overnight pulse oximetry has been scheduled for Kym?  I need it for his oxygen continuation.  Thanks, Leotis Shames

## 2018-08-16 NOTE — Telephone Encounter (Signed)
I sent a community message fom Epic to [employer], RPSGT at [facility]  for Overnight Pulse Oximetry.

## 2018-08-18 ENCOUNTER — Encounter: Payer: Self-pay | Admitting: Nurse Practitioner

## 2018-08-30 DIAGNOSIS — Z6841 Body Mass Index (BMI) 40.0 and over, adult: Secondary | ICD-10-CM | POA: Diagnosis not present

## 2018-08-30 DIAGNOSIS — Z794 Long term (current) use of insulin: Secondary | ICD-10-CM | POA: Diagnosis not present

## 2018-08-30 DIAGNOSIS — E1140 Type 2 diabetes mellitus with diabetic neuropathy, unspecified: Secondary | ICD-10-CM | POA: Diagnosis not present

## 2018-08-30 DIAGNOSIS — B351 Tinea unguium: Secondary | ICD-10-CM | POA: Diagnosis not present

## 2018-08-31 ENCOUNTER — Telehealth: Payer: Self-pay | Admitting: Nurse Practitioner

## 2018-08-31 NOTE — Telephone Encounter (Signed)
Pt needs a letter sent to [facility] for gastric bypass surgery about medical necessity.  Please fax to Attn:  Bariatric Coordinator [phone].  Pt's call back number is [phone]

## 2018-08-31 NOTE — Telephone Encounter (Addendum)
After speaking with [facility] they informed me that Philip [name] is not a patient and any questions would have to answer by the Bariatric Dept.

## 2018-09-01 NOTE — Telephone Encounter (Signed)
I contacted [facility] and left a message on Marnette BurgessBlanca Nunez Bariatric Coordinator Assistant  [phone] to find out what is actually needed for this referral.

## 2018-09-06 NOTE — Telephone Encounter (Signed)
I attempted to contact Philip [name] again, no answer. LMOM to return my call because I have questions about the referral process. The message states to allow 1-5 days for a return call.

## 2018-09-08 DIAGNOSIS — J45909 Unspecified asthma, uncomplicated: Secondary | ICD-10-CM | POA: Diagnosis not present

## 2018-09-08 DIAGNOSIS — I10 Essential (primary) hypertension: Secondary | ICD-10-CM | POA: Diagnosis not present

## 2018-09-08 DIAGNOSIS — J9611 Chronic respiratory failure with hypoxia: Secondary | ICD-10-CM | POA: Diagnosis not present

## 2018-09-11 ENCOUNTER — Other Ambulatory Visit: Payer: Self-pay | Admitting: Family Medicine

## 2018-09-14 MED ORDER — SILDENAFIL CITRATE 20 MG PO TABS: ORAL_TABLET | ORAL | 3 refills | Status: DC

## 2018-09-15 DIAGNOSIS — F331 Major depressive disorder, recurrent, moderate: Secondary | ICD-10-CM | POA: Diagnosis not present

## 2018-09-17 ENCOUNTER — Other Ambulatory Visit: Payer: Self-pay | Admitting: Urology

## 2018-09-19 ENCOUNTER — Encounter: Payer: Self-pay | Admitting: Nurse Practitioner

## 2018-09-19 DIAGNOSIS — R0902 Hypoxemia: Secondary | ICD-10-CM | POA: Diagnosis not present

## 2018-09-19 DIAGNOSIS — G4734 Idiopathic sleep related nonobstructive alveolar hypoventilation: Secondary | ICD-10-CM | POA: Insufficient documentation

## 2018-09-19 NOTE — Telephone Encounter (Signed)
The pt brought paperwork by our office that had to filled out per the patient that needed to be faxed with his medication list. The paperwork was faxed to [facility]. I have not received a return call from Aki[place]Blanca after leaving several vm.

## 2018-09-22 ENCOUNTER — Telehealth: Payer: Self-pay | Admitting: Nurse Practitioner

## 2018-09-22 NOTE — Telephone Encounter (Signed)
Called patient to update. Patient had prolonged time < 80% and is supported to keep overnight oxygen.  Forms have been completed to fax to [employer] to continue oxygen.  Starts bariatric classes on Jan 22nd.

## 2018-09-22 NOTE — Telephone Encounter (Signed)
Pt asked if results form oxygen test had come in [phone]

## 2018-09-22 NOTE — Telephone Encounter (Signed)
Please Advise

## 2018-10-05 ENCOUNTER — Ambulatory Visit (INDEPENDENT_AMBULATORY_CARE_PROVIDER_SITE_OTHER): Payer: Medicare HMO | Admitting: Nurse Practitioner

## 2018-10-05 ENCOUNTER — Other Ambulatory Visit: Payer: Self-pay | Admitting: Nurse Practitioner

## 2018-10-05 ENCOUNTER — Encounter: Payer: Self-pay | Admitting: Nurse Practitioner

## 2018-10-05 VITALS — BP 125/77 | HR 81 | Temp 98.2°F | Resp 16 | Ht 76.0 in | Wt >= 6400 oz

## 2018-10-05 DIAGNOSIS — J441 Chronic obstructive pulmonary disease with (acute) exacerbation: Secondary | ICD-10-CM

## 2018-10-05 DIAGNOSIS — J019 Acute sinusitis, unspecified: Secondary | ICD-10-CM | POA: Diagnosis not present

## 2018-10-05 DIAGNOSIS — G8929 Other chronic pain: Secondary | ICD-10-CM

## 2018-10-05 DIAGNOSIS — M5442 Lumbago with sciatica, left side: Principal | ICD-10-CM

## 2018-10-05 MED ORDER — IPRATROPIUM BROMIDE 0.06 % NA SOLN: 2.0000 | Freq: Four times a day (QID) | NASAL | 12 refills | Status: DC

## 2018-10-05 MED ORDER — PREDNISONE 50 MG PO TABS: 50.0000 mg | ORAL_TABLET | Freq: Every day | ORAL | 0 refills | Status: AC

## 2018-10-05 NOTE — Patient Instructions (Addendum)
Keitha Butteichard Rugg II,   Thank you for coming in to clinic today.  1. You have a mild acute COPD exacerbation with viral upper respiratory infection. - Take prednisone 50 mg one tablet daily in morning for 5 days. - Continue taking only extra rescue inhaler. - Stop using extra maintenance/regular inhalers.  These will not help more.  2. It sounds like you have a Upper Respiratory Virus - this will most likely run it's course in 7 to 10 days. Recommend good hand washing. - Start Atrovent nasal spray decongestant 2 sprays each nostril up to 4 times daily for 5-7 days - If congestion is worse, start OTC Mucinex (or may try Mucinex-DM for cough) up to 7-10 days then stop - Drink plenty of fluids to improve congestion - You may try over the counter Nasal Saline spray (Simply Saline, Ocean Spray) as needed to reduce congestion. - Drink warm herbal tea with honey for sore throat. - Start taking Tylenol extra strength 1 to 2 tablets every 6-8 hours for aches or fever/chills for next few days as needed.  Do not take more than 3,000 mg in 24 hours from all medicines.  May take Ibuprofen as well if tolerated 200-400mg  every 8 hours as needed.  If symptoms significantly worsening with persistent fevers/chills despite tylenol/ibpurofen, nausea, vomiting unable to tolerate food/fluids or medicine, body aches, or shortness of breath, sinus pain pressure or worsening productive cough, then follow-up for re-evaluation, may seek more immediate care at Urgent Care or ED if more concerned for emergency.  Please schedule a follow-up appointment with Wilhelmina McardleLauren Claudina Oliphant, AGNP. Return 5-7 days if symptoms worsen or fail to improve.  If you have any other questions or concerns, please feel free to call the clinic or send a message through MyChart. You may also schedule an earlier appointment if necessary.  You will receive a survey after today's visit either digitally by e-mail or paper by [employer] mail. Your experiences and  feedback matter to us.  Please respond so we know how we are doing as we provide care for you.   Wilhelmina McardleLauren Lavene Penagos, DNP, AGNP-BC Adult Gerontology Nurse Practitioner [facility], [facility]

## 2018-10-05 NOTE — Progress Notes (Signed)
Subjective:    Patient ID: Philip [name], male    DOB: [birth year], 57 y.o.   MRN: [id number]  Philip [name] is a 57 y.o. male presenting on 10/05/2018 for Cough (wheezing past Friday got worst on weekend today little improve then yesterday but has sinus drainage)   HPI URI Initial symptoms of malaise and cough occurred 7 days ago.  Also was wheezing and had worsening on Friday.  Was taking regular inhaler 3 times and emergency inhaler.  Saturday wheezing continued worsening.  Cough is improving today, but always worsens in evening.   - Minerva Areolaric is only just now starting to get symptoms. - Patient has also used oxygen during day more than usual.  Continues using this at night. - Denies current fever, chills, or sweats, nausea, vomiting, or diarrhea.  Social History   Tobacco Use  . Smoking status: Never Smoker  . Smokeless tobacco: Never Used  Substance Use Topics  . Alcohol use: Yes    Comment: occasionally  . Drug use: No    Review of Systems Per HPI unless specifically indicated above     Objective:    BP 125/77   Pulse 81   Temp 98.2 F (36.8 C) (Oral)   Resp 16   Ht 6\' 4"  (1.93 m)   Wt (!) 423 lb (191.9 kg)   SpO2 96%   BMI 51.49 kg/m   Wt Readings from Last 3 Encounters:  10/05/18 (!) 423 lb (191.9 kg)  07/27/18 (!) 425 lb 8 oz (193 kg)  05/18/18 (!) 423 lb (191.9 kg)    Physical Exam Vitals signs reviewed.  Constitutional:      General: He is not in acute distress.    Appearance: He is well-developed.  HENT:     Head: Normocephalic and atraumatic.     Right Ear: Hearing, tympanic membrane, ear canal and external ear normal.     Left Ear: Hearing, tympanic membrane, ear canal and external ear normal.     Nose: Mucosal edema and rhinorrhea present.     Right Sinus: Maxillary sinus tenderness and frontal sinus tenderness present.     Left Sinus: Maxillary sinus tenderness and frontal sinus tenderness present.     Mouth/Throat:     Lips: Pink.   Mouth: Mucous membranes are moist.     Pharynx: Uvula midline. No pharyngeal swelling, oropharyngeal exudate (clear secretions), posterior oropharyngeal erythema or uvula swelling.     Tonsils: No tonsillar exudate. Swelling: 1+ on the right. 1+ on the left.  Eyes:     General: Lids are normal.        Right eye: No discharge.        Left eye: No discharge.     Conjunctiva/sclera: Conjunctivae normal.     Pupils: Pupils are equal, round, and reactive to light.  Neck:     Musculoskeletal: Full passive range of motion without pain, normal range of motion and neck supple.  Cardiovascular:     Rate and Rhythm: Normal rate and regular rhythm.     Pulses:          Radial pulses are 2+ on the right side and 2+ on the left side.       Posterior tibial pulses are 1+ on the right side and 1+ on the left side.     Heart sounds: Normal heart sounds, S1 normal and S2 normal.  Pulmonary:     Effort: Pulmonary effort is normal. No respiratory distress.  Breath sounds: Normal air entry. Wheezing (throughout) and rhonchi (throughout) present.  Musculoskeletal:     Right lower leg: No edema.     Left lower leg: No edema.  Lymphadenopathy:     Cervical: Cervical adenopathy present.     Right cervical: Superficial cervical adenopathy present.     Left cervical: Superficial cervical adenopathy present.  Skin:    General: Skin is warm and dry.     Capillary Refill: Capillary refill takes less than 2 seconds.  Neurological:     General: No focal deficit present.     Mental Status: He is alert and oriented to person, place, and time.     GCS: GCS eye subscore is 4. GCS verbal subscore is 5. GCS motor subscore is 6.  Psychiatric:        Attention and Perception: Attention normal.        Mood and Affect: Mood and affect normal.        Behavior: Behavior normal. Behavior is cooperative.        Thought Content: Thought content normal.        Judgment: Judgment normal.    Results for orders placed or  performed in visit on 07/27/18  POCT glycosylated hemoglobin (Hb A1C)  Result Value Ref Range   Hemoglobin A1C 8.9 (A) 4.0 - 5.6 %   HbA1c POC (<> result, manual entry)     HbA1c, POC (prediabetic range)     HbA1c, POC (controlled diabetic range)        Assessment & Plan:   Problem List Items Addressed This Visit    None    Visit Diagnoses    Acute exacerbation of chronic obstructive pulmonary disease (COPD) (HCC)    -  Primary   Relevant Medications   predniSONE (DELTASONE) 50 MG tablet   ipratropium (ATROVENT) 0.06 % nasal spray   Acute rhinosinusitis       Relevant Medications   predniSONE (DELTASONE) 50 MG tablet   ipratropium (ATROVENT) 0.06 % nasal spray      Consistent with mild acute exacerbation of COPD with worsening productive cough. Similar to prior exacerbations, last > 1 year ago. - No hypoxia (96% on RA), afebrile, no recent hospitalization - Continues Albuterol, Symbicort, Spiriva  Plan: 1. Start Prednisone 50mg  x 5 day steroid burst - START atrovent nasal spray for rhinorrhea 2. Use albuterol q 4 hr regularly x 2-3 days. Continue maintenance inhalers 3. Considered antibiotics, however afebrile and no hypoxia, would not be indicated in mild AECOPD, unless recurrent or not improved on steroids 4. RTC about 1 week if not improving, otherwise strict return criteria to go to ED   Meds ordered this encounter  Medications  . predniSONE (DELTASONE) 50 MG tablet    Sig: Take 1 tablet (50 mg total) by mouth daily with breakfast for 5 days.    Dispense:  5 tablet    Refill:  0    Order Specific Question:   Supervising Provider    Answer:   Smitty Cords [[id number]]  . ipratropium (ATROVENT) 0.06 % nasal spray    Sig: Place 2 sprays into both nostrils 4 (four) times daily.    Dispense:  15 mL    Refill:  12    Order Specific Question:   Supervising Provider    Answer:   Smitty Cords [[id number]]   Follow up plan: Return 5-7 days if symptoms worsen  or fail to improve.  [name], DNP, AGPCNP-BC Adult Gerontology Primary  Care Nurse Practitioner [facility] 10/05/2018, 3:36 PM

## 2018-10-07 ENCOUNTER — Other Ambulatory Visit: Payer: Self-pay | Admitting: Nurse Practitioner

## 2018-10-07 DIAGNOSIS — K219 Gastro-esophageal reflux disease without esophagitis: Secondary | ICD-10-CM

## 2018-10-08 DIAGNOSIS — J45909 Unspecified asthma, uncomplicated: Secondary | ICD-10-CM | POA: Diagnosis not present

## 2018-10-08 DIAGNOSIS — I10 Essential (primary) hypertension: Secondary | ICD-10-CM | POA: Diagnosis not present

## 2018-10-08 DIAGNOSIS — J9611 Chronic respiratory failure with hypoxia: Secondary | ICD-10-CM | POA: Diagnosis not present

## 2018-10-10 ENCOUNTER — Encounter: Payer: Self-pay | Admitting: Nurse Practitioner

## 2018-10-18 ENCOUNTER — Other Ambulatory Visit: Payer: Self-pay | Admitting: Nurse Practitioner

## 2018-10-18 DIAGNOSIS — E039 Hypothyroidism, unspecified: Secondary | ICD-10-CM

## 2018-10-20 MED ORDER — LEVOTHYROXINE SODIUM 200 MCG PO TABS: 200.0000 ug | ORAL_TABLET | Freq: Every day | ORAL | 5 refills | Status: DC

## 2018-10-23 ENCOUNTER — Other Ambulatory Visit: Payer: Self-pay | Admitting: Nurse Practitioner

## 2018-10-23 ENCOUNTER — Telehealth: Payer: Self-pay | Admitting: Nurse Practitioner

## 2018-10-23 DIAGNOSIS — E119 Type 2 diabetes mellitus without complications: Secondary | ICD-10-CM

## 2018-10-23 NOTE — Telephone Encounter (Signed)
The pt was notified. She verbal understanding, no questions or concerns.  

## 2018-10-23 NOTE — Telephone Encounter (Signed)
Patient may take Colace 200 mg once daily or 100 mg twice daily.  He should also take dulcolax 5 mg once today and repeat tomorrow if no BM.

## 2018-10-23 NOTE — Telephone Encounter (Signed)
Pt called said that he have not had a BM in  7 days wanted to know what he can take. Pt call back # is [phone]

## 2018-10-26 ENCOUNTER — Other Ambulatory Visit: Payer: Self-pay | Admitting: Nurse Practitioner

## 2018-10-26 DIAGNOSIS — M159 Polyosteoarthritis, unspecified: Secondary | ICD-10-CM

## 2018-10-26 DIAGNOSIS — M150 Primary generalized (osteo)arthritis: Principal | ICD-10-CM

## 2018-10-27 ENCOUNTER — Emergency Department
Admission: EM | Admit: 2018-10-27 | Discharge: 2018-10-27 | Disposition: A | Discharge status: Home / Self Care | Payer: Medicare HMO | Attending: Emergency Medicine | Admitting: Emergency Medicine

## 2018-10-27 ENCOUNTER — Encounter: Payer: Self-pay | Admitting: Emergency Medicine

## 2018-10-27 ENCOUNTER — Other Ambulatory Visit: Payer: Self-pay

## 2018-10-27 ENCOUNTER — Emergency Department: Payer: Medicare HMO

## 2018-10-27 DIAGNOSIS — Z91040 Latex allergy status: Secondary | ICD-10-CM | POA: Insufficient documentation

## 2018-10-27 DIAGNOSIS — E1143 Type 2 diabetes mellitus with diabetic autonomic (poly)neuropathy: Secondary | ICD-10-CM | POA: Diagnosis not present

## 2018-10-27 DIAGNOSIS — R059 Cough, unspecified: Secondary | ICD-10-CM

## 2018-10-27 DIAGNOSIS — J45909 Unspecified asthma, uncomplicated: Secondary | ICD-10-CM | POA: Insufficient documentation

## 2018-10-27 DIAGNOSIS — J329 Chronic sinusitis, unspecified: Secondary | ICD-10-CM | POA: Diagnosis not present

## 2018-10-27 DIAGNOSIS — E039 Hypothyroidism, unspecified: Secondary | ICD-10-CM | POA: Diagnosis not present

## 2018-10-27 DIAGNOSIS — R0602 Shortness of breath: Secondary | ICD-10-CM | POA: Diagnosis not present

## 2018-10-27 DIAGNOSIS — M25511 Pain in right shoulder: Secondary | ICD-10-CM | POA: Diagnosis not present

## 2018-10-27 DIAGNOSIS — M549 Dorsalgia, unspecified: Secondary | ICD-10-CM | POA: Diagnosis not present

## 2018-10-27 DIAGNOSIS — J449 Chronic obstructive pulmonary disease, unspecified: Secondary | ICD-10-CM | POA: Insufficient documentation

## 2018-10-27 DIAGNOSIS — R05 Cough: Secondary | ICD-10-CM

## 2018-10-27 LAB — CBC
HCT: 51.4 % (ref 39.0–52.0)
HEMOGLOBIN: 16.6 g/dL (ref 13.0–17.0)
MCH: 31.2 pg (ref 26.0–34.0)
MCHC: 32.3 g/dL (ref 30.0–36.0)
MCV: 96.6 fL (ref 80.0–100.0)
Platelets: 320 10*3/uL (ref 150–400)
RBC: 5.32 MIL/uL (ref 4.22–5.81)
RDW: 12.6 % (ref 11.5–15.5)
WBC: 6.4 10*3/uL (ref 4.0–10.5)
nRBC: 0 % (ref 0.0–0.2)

## 2018-10-27 LAB — BASIC METABOLIC PANEL
ANION GAP: 7 (ref 5–15)
BUN: 15 mg/dL (ref 6–20)
CO2: 31 mmol/L (ref 22–32)
Calcium: 10.4 mg/dL — ABNORMAL HIGH (ref 8.9–10.3)
Chloride: 99 mmol/L (ref 98–111)
Creatinine, Ser: 1.09 mg/dL (ref 0.61–1.24)
GFR calc Af Amer: >60 mL/min (ref >60)
Glucose, Bld: 202 mg/dL — ABNORMAL HIGH (ref 70–99)
POTASSIUM: 4.1 mmol/L (ref 3.5–5.1)
Sodium: 137 mmol/L (ref 135–145)

## 2018-10-27 LAB — HEPATIC FUNCTION PANEL
ALT: 62 U/L — ABNORMAL HIGH (ref 0–44)
AST: 56 U/L — ABNORMAL HIGH (ref 15–41)
Albumin: 3.9 g/dL (ref 3.5–5.0)
Alkaline Phosphatase: 53 U/L (ref 38–126)
BILIRUBIN DIRECT: 0.1 mg/dL (ref 0.0–0.2)
Indirect Bilirubin: 0.8 mg/dL (ref 0.3–0.9)
Total Bilirubin: 0.9 mg/dL (ref 0.3–1.2)
Total Protein: 6.9 g/dL (ref 6.5–8.1)

## 2018-10-27 LAB — TROPONIN I: Troponin I: <0.03 ng/mL (ref <0.03)

## 2018-10-27 MED ORDER — HYDROCOD POLST-CPM POLST ER 10-8 MG/5ML PO SUER: 5.0000 mL | Freq: Once | ORAL | Status: DC

## 2018-10-27 MED ORDER — HYDROCOD POLST-CPM POLST ER 10-8 MG/5ML PO SUER: 5.0000 mL | Freq: Two times a day (BID) | ORAL | 0 refills | Status: DC

## 2018-10-27 MED ORDER — LEVOFLOXACIN 500 MG PO TABS: 500.0000 mg | ORAL_TABLET | Freq: Every day | ORAL | 0 refills | Status: AC

## 2018-10-27 MED ORDER — LEVOFLOXACIN 500 MG PO TABS: 500.0000 mg | ORAL_TABLET | Freq: Every day | ORAL | 0 refills | Status: DC

## 2018-10-27 NOTE — ED Provider Notes (Signed)
[facility] Emergency Department Provider Note       Time seen: ----------------------------------------- 6:37 PM on 10/27/2018 -----------------------------------------   I have reviewed the triage vital signs and the nursing notes.  HISTORY   Chief Complaint Shortness of Breath and Shoulder Pain    HPI Philip [name] is a 58 y.o. male with a history of anxiety, asthma, bipolar disorder, COPD, diabetes who presents to the ED for right-sided back pain and shortness of breath.  Patient has been having sinus pressure as well as upper respiratory symptoms that have persisted for the past month almost.  He denies fevers, chills, GI complaints.  Currently his breathing has improved.  Past Medical History:  Diagnosis Date  . Anxiety   . Asthma   . Bipolar 1 disorder (HCC)   . COPD (chronic obstructive pulmonary disease) (HCC)   . Depression   . Diabetes mellitus without complication (HCC)   . GERD (gastroesophageal reflux disease)   . Osteoarthritis   . Thyroid disease     Patient Active Problem List   Diagnosis Date Noted  . Nocturnal hypoxia 09/19/2018  . Hypoventilation associated with obesity syndrome (HCC) 08/11/2018  . Morbid obesity with body mass index (BMI) of 50.0 to 59.9 in adult (HCC) 02/07/2018  . Hypogonadism in male 01/09/2018  . Erectile dysfunction 01/09/2018  . Benign prostatic hyperplasia with lower urinary tract symptoms 01/09/2018  . Chronic respiratory failure with hypercapnia (HCC) 07/09/2017  . Asthma 07/05/2017  . COPD (chronic obstructive pulmonary disease) (HCC) 07/05/2017  . GERD (gastroesophageal reflux disease) 07/05/2017  . Type 2 diabetes mellitus with autonomic neuropathy (HCC) 07/05/2017  . Hypothyroid 07/05/2017  . Osteoarthritis 07/05/2017  . Bipolar 1 disorder (HCC) 07/05/2017    Past Surgical History:  Procedure Laterality Date  . CHOLECYSTECTOMY    . SHOULDER SURGERY Right   . UMBILICAL HERNIA REPAIR       Allergies Aripiprazole; Duloxetine hcl; Fenofibrate; Lamotrigine; Neosporin [neomycin-bacitracin zn-polymyx]; Pregabalin; Simvastatin; Latex; and Valproic acid  Social History Social History   Tobacco Use  . Smoking status: Never Smoker  . Smokeless tobacco: Never Used  Substance Use Topics  . Alcohol use: Yes    Comment: occasionally  . Drug use: No   Review of Systems Constitutional: Negative for fever. Cardiovascular: Negative for chest pain. Respiratory: Positive for shortness of breath and cough Gastrointestinal: Negative for abdominal pain, vomiting and diarrhea. Musculoskeletal: Positive for recent back pain Skin: Negative for rash. Neurological: Negative for headaches, focal weakness or numbness.  All systems negative/normal/unremarkable except as stated in the HPI  ____________________________________________   PHYSICAL EXAM:  VITAL SIGNS: ED Triage Vitals  Enc Vitals Group     BP 10/27/18 1742 136/87     Pulse Rate 10/27/18 1742 80     Resp 10/27/18 1742 (!) 22     Temp 10/27/18 1742 97.9 F (36.6 C)     Temp Source 10/27/18 1742 Oral     SpO2 10/27/18 1742 95 %     Weight 10/27/18 1744 (!) 425 lb (192.8 kg)     Height 10/27/18 1744 6\' 3"  (1.905 m)     Head Circumference --      Peak Flow --      Pain Score 10/27/18 1743 5     Pain Loc --      Pain Edu? --      Excl. in GC? --    Constitutional: Alert and oriented.  Morbidly obese, no distress Eyes: Conjunctivae are normal. Normal  extraocular movements. ENT      Head: Normocephalic and atraumatic.      Nose: No congestion/rhinnorhea.      Mouth/Throat: Mucous membranes are moist.      Neck: No stridor. Cardiovascular: Normal rate, regular rhythm. No murmurs, rubs, or gallops. Respiratory: Normal respiratory effort without tachypnea nor retractions. Breath sounds are clear and equal bilaterally. No wheezes/rales/rhonchi. Gastrointestinal: Soft and nontender. Normal bowel  sounds Musculoskeletal: Nontender with normal range of motion in extremities. No lower extremity tenderness nor edema. Neurologic:  Normal speech and language. No gross focal neurologic deficits are appreciated.  Skin:  Skin is warm, dry and intact. No rash noted. Psychiatric: Mood and affect are normal. Speech and behavior are normal.  ____________________________________________  EKG: Interpreted by me.  Sinus rhythm rate 74 bpm, normal PR interval, normal QRS, normal QT  ____________________________________________  ED COURSE:  As part of my medical decision making, I reviewed the following data within the electronic MEDICAL RECORD NUMBER History obtained from family if available, nursing notes, old chart and ekg, as well as notes from prior ED visits. Patient presented for shortness of breath, we will assess with labs and imaging as indicated at this time.   Procedures ____________________________________________   LABS (pertinent positives/negatives)  Labs Reviewed  BASIC METABOLIC PANEL - Abnormal; Notable for the following components:      Result Value   Glucose, Bld 202 (*)    Calcium 10.4 (*)    All other components within normal limits  HEPATIC FUNCTION PANEL - Abnormal; Notable for the following components:   AST 56 (*)    ALT 62 (*)    All other components within normal limits  CBC  TROPONIN I    RADIOLOGY Images were viewed by me  Chest x-ray IMPRESSION: Large body habitus with somewhat low lung volumes but evidence of increased bilateral pulmonary interstitial opacity. Consider acute viral or atypical respiratory infection, less likely interstitial edema. No pleural effusion. ____________________________________________   DIFFERENTIAL DIAGNOSIS   COPD, pneumonia, URI, sinusitis, anxiety, spasm, PE  FINAL ASSESSMENT AND PLAN  Sinusitis, possible pneumonia, back pain   Plan: The patient had presented for complaints of shortness of breath, flank pain.  Patient's labs were reassuring. Patient's imaging was more suggestive of atypical infection.  I will prescribe antibiotics for him.  He is cleared for outpatient follow-up with his doctor.   [name], MD    Note: This note was generated in part or whole with voice recognition software. Voice recognition is usually quite accurate but there are transcription errors that can and very often do occur. I apologize for any typographical errors that were not detected and corrected.     Emily FilbertWilliams, Jonathan E, MD 10/27/18 Kristopher [id number]

## 2018-10-30 ENCOUNTER — Other Ambulatory Visit: Payer: Self-pay | Admitting: Nurse Practitioner

## 2018-10-30 DIAGNOSIS — M5442 Lumbago with sciatica, left side: Principal | ICD-10-CM

## 2018-10-30 DIAGNOSIS — G8929 Other chronic pain: Secondary | ICD-10-CM

## 2018-10-31 ENCOUNTER — Ambulatory Visit: Payer: Medicare HMO | Admitting: Nurse Practitioner

## 2018-11-02 ENCOUNTER — Other Ambulatory Visit: Payer: Self-pay

## 2018-11-02 ENCOUNTER — Ambulatory Visit (INDEPENDENT_AMBULATORY_CARE_PROVIDER_SITE_OTHER): Payer: Medicare HMO | Admitting: Nurse Practitioner

## 2018-11-02 VITALS — BP 115/68 | HR 87 | Temp 98.4°F | Ht 75.0 in | Wt >= 6400 oz

## 2018-11-02 DIAGNOSIS — E1169 Type 2 diabetes mellitus with other specified complication: Secondary | ICD-10-CM | POA: Diagnosis not present

## 2018-11-02 DIAGNOSIS — K219 Gastro-esophageal reflux disease without esophagitis: Secondary | ICD-10-CM

## 2018-11-02 DIAGNOSIS — M5442 Lumbago with sciatica, left side: Secondary | ICD-10-CM

## 2018-11-02 DIAGNOSIS — J301 Allergic rhinitis due to pollen: Secondary | ICD-10-CM

## 2018-11-02 DIAGNOSIS — E785 Hyperlipidemia, unspecified: Secondary | ICD-10-CM

## 2018-11-02 DIAGNOSIS — M150 Primary generalized (osteo)arthritis: Secondary | ICD-10-CM

## 2018-11-02 DIAGNOSIS — G8929 Other chronic pain: Secondary | ICD-10-CM

## 2018-11-02 DIAGNOSIS — E039 Hypothyroidism, unspecified: Secondary | ICD-10-CM

## 2018-11-02 DIAGNOSIS — M8949 Other hypertrophic osteoarthropathy, multiple sites: Secondary | ICD-10-CM

## 2018-11-02 DIAGNOSIS — F319 Bipolar disorder, unspecified: Secondary | ICD-10-CM

## 2018-11-02 DIAGNOSIS — E119 Type 2 diabetes mellitus without complications: Secondary | ICD-10-CM | POA: Diagnosis not present

## 2018-11-02 DIAGNOSIS — M159 Polyosteoarthritis, unspecified: Secondary | ICD-10-CM

## 2018-11-02 DIAGNOSIS — Z794 Long term (current) use of insulin: Secondary | ICD-10-CM

## 2018-11-02 DIAGNOSIS — J019 Acute sinusitis, unspecified: Secondary | ICD-10-CM

## 2018-11-02 DIAGNOSIS — E1143 Type 2 diabetes mellitus with diabetic autonomic (poly)neuropathy: Secondary | ICD-10-CM | POA: Diagnosis not present

## 2018-11-02 MED ORDER — INSULIN GLARGINE (2 UNIT DIAL) 300 UNIT/ML ~~LOC~~ SOPN: 70.0000 [IU] | PEN_INJECTOR | Freq: Two times a day (BID) | SUBCUTANEOUS | 1 refills | Status: DC

## 2018-11-02 MED ORDER — TIZANIDINE HCL 4 MG PO TABS: ORAL_TABLET | ORAL | 2 refills | Status: DC

## 2018-11-02 NOTE — Patient Instructions (Addendum)
Philip [name],   Thank you for coming in to clinic today.  1. Your provider would like to you have your annual eye exam. Please contact your current eye doctor or here are some good options for you to contact.   [facility]   Address: [address] [place], [place] [address] Phone: [phone]  Website: [email or web address]  [facility]  Address: [address] [place], [place], [place] [address] Phone: [phone]   [facility] [address] [place], [place] [address] Phone: [phone]  [facility] Address: [address] [place], [place], [place] [address]  Phone: [phone]  2. INCREASE Toujeo to 70 units twice daily  3. You will be due for FASTING BLOOD WORK.  This means you should eat no food or drink after midnight.  Drink only water or coffee without cream/sugar on the morning of your lab visit. - Please go ahead and schedule a "Lab Only" visit in the morning at the clinic for lab draw in the next 7 days. - Your results will be available about 2-3 days after blood draw.  If you have set up a MyChart account, you can can log in to MyChart online to view your results and a brief explanation. Also, we can discuss your results together at your next office visit if you would like.  Please schedule a follow-up appointment with [name], AGNP. Return in about 3 months (around 02/01/2019) for diabetes.  If you have any other questions or concerns, please feel free to call the clinic or send a message through MyChart. You may also schedule an earlier appointment if necessary.  You will receive a survey after today's visit either digitally by e-mail or paper by [employer]. Your experiences and feedback matter to [place].  Please respond so we know how we are doing as we provide care for you.   [name], DNP, AGNP-BC Adult Gerontology Nurse Practitioner [facility], [facility]

## 2018-11-02 NOTE — Progress Notes (Signed)
Subjective:    Patient ID: Philip [name], male    DOB: 05-16-1961, 58 y.o.   MRN: [id number]  Philip [name] is a 58 y.o. male presenting on 11/02/2018 for Diabetes (averaging 168) and Sinusitis (f/u ER )  HPI Diabetes  Pt presents today for follow up of Type 2 diabetes mellitus.  He is checking fasting am CBG at home with a range of low 100 and high 269, usually 140-160. 185-240 in afternoons after lunch. - Current diabetic medications include: metformin 1000 mg twice daily, Toujeo 62 units twice daily, Novolog 20 units with meals plus correction scale up to 15 additional units, Victoza daily - He is not currently symptomatic.   He had a low of 66 woke up in cold sweat last night.  This was "first time in a long time." - He denies polydipsia, polyphagia, polyuria, headaches, diaphoresis, shakiness, chills, pain, numbness or tingling in extremities and changes in vision.   - Patient has been on prednisone for recent respiratory infections. - Patient is preparing for bariatric surgery with Dr. Daphine Deutscher - Clinical course has been stable. - He  reports no exercise.  Continues with cleaning as able but limited by breathing. - His diet is moderate in salt, high in fat, and high in carbohydrates. - Weight trend: stable, but beginning to trend down  PREVENTION: Eye exam current (within one year): yes - due to be scheduled Foot exam current (within one year): yes Lipid/ASCVD risk reduction - on statin: yes - regularly has more aching with this med Kidney protection - on ace or arb: lisinopril Recent Labs    01/11/18 0955 04/20/18 1358 07/27/18 1451 11/03/18 1700  HGBA1C 8.3 8.2* 8.9* 9.3*     Sinusitis Follow-up after sinusitis and ER visit, is doing well now without any unusual shortness of breath.   Social History   Tobacco Use  . Smoking status: Never Smoker  . Smokeless tobacco: Never Used  Substance Use Topics  . Alcohol use: Yes    Comment: occasionally  . Drug use: No     Review of Systems Per HPI unless specifically indicated above     Objective:    BP 115/68 (BP Location: Right Arm, Patient Position: Sitting, Cuff Size: Large)   Pulse 87   Temp 98.4 F (36.9 C) (Oral)   Ht 6\' 3"  (1.905 m)   Wt (!) 419 lb 8 oz (190.3 kg)   BMI 52.43 kg/m   Wt Readings from Last 3 Encounters:  11/02/18 (!) 419 lb 8 oz (190.3 kg)  10/27/18 (!) 425 lb (192.8 kg)  10/05/18 (!) 423 lb (191.9 kg)    Physical Exam Vitals signs reviewed.  Constitutional:      General: He is not in acute distress.    Appearance: He is well-developed. He is morbidly obese.  HENT:     Head: Normocephalic and atraumatic.  Cardiovascular:     Rate and Rhythm: Normal rate and regular rhythm.     Pulses:          Radial pulses are 2+ on the right side and 2+ on the left side.       Posterior tibial pulses are 1+ on the right side and 1+ on the left side.     Heart sounds: Normal heart sounds, S1 normal and S2 normal.  Pulmonary:     Effort: Pulmonary effort is normal. No respiratory distress.     Breath sounds: Normal breath sounds and air entry.  Musculoskeletal:  Right lower leg: No edema.     Left lower leg: No edema.  Skin:    General: Skin is warm and dry.     Capillary Refill: Capillary refill takes less than 2 seconds.  Neurological:     Mental Status: He is alert and oriented to person, place, and time.  Psychiatric:        Attention and Perception: Attention normal.        Mood and Affect: Mood and affect normal.        Behavior: Behavior normal. Behavior is cooperative.     Results for orders placed or performed during the hospital encounter of 10/27/18  Basic metabolic panel  Result Value Ref Range   Sodium 137 135 - 145 mmol/L   Potassium 4.1 3.5 - 5.1 mmol/L   Chloride 99 98 - 111 mmol/L   CO2 31 22 - 32 mmol/L   Glucose, Bld 202 (H) 70 - 99 mg/dL   BUN 15 6 - 20 mg/dL   Creatinine, Ser 6.01 0.61 - 1.24 mg/dL   Calcium 56.1 (H) 8.9 - 10.3 mg/dL    GFR calc non Af Amer >60 >60 mL/min   GFR calc Af Amer >60 >60 mL/min   Anion gap 7 5 - 15  CBC  Result Value Ref Range   WBC 6.4 4.0 - 10.5 K/uL   RBC 5.32 4.22 - 5.81 MIL/uL   Hemoglobin 16.6 13.0 - 17.0 g/dL   HCT 53.7 94.3 - 27.6 %   MCV 96.6 80.0 - 100.0 fL   MCH 31.2 26.0 - 34.0 pg   MCHC 32.3 30.0 - 36.0 g/dL   RDW 14.7 09.2 - 95.7 %   Platelets 320 150 - 400 K/uL   nRBC 0.0 0.0 - 0.2 %  Troponin I - ONCE - STAT  Result Value Ref Range   Troponin I <0.03 <0.03 ng/mL  Hepatic function panel  Result Value Ref Range   Total Protein 6.9 6.5 - 8.1 g/dL   Albumin 3.9 3.5 - 5.0 g/dL   AST 56 (H) 15 - 41 U/L   ALT 62 (H) 0 - 44 U/L   Alkaline Phosphatase 53 38 - 126 U/L   Total Bilirubin 0.9 0.3 - 1.2 mg/dL   Bilirubin, Direct 0.1 0.0 - 0.2 mg/dL   Indirect Bilirubin 0.8 0.3 - 0.9 mg/dL      Assessment & Plan:   Problem List Items Addressed This Visit      Digestive   GERD (gastroesophageal reflux disease) Stable today on exam.  Medications tolerated without side effects.  Continue at current doses.  Refills provided.  Check labs today. Followup 6 months.    Relevant Medications   omeprazole (PRILOSEC) 40 MG capsule   Other Relevant Orders   CBC with Differential/Platelet     Endocrine   Type 2 diabetes mellitus with autonomic neuropathy (HCC) - Primary   Relevant Medications   Insulin Glargine, 2 Unit Dial, (TOUJEO MAX SOLOSTAR) 300 UNIT/ML SOPN   atorvastatin (LIPITOR) 10 MG tablet   gabapentin (NEURONTIN) 400 MG capsule   metFORMIN (GLUCOPHAGE-XR) 500 MG 24 hr tablet   NOVOLOG FLEXPEN 100 UNIT/ML FlexPen   VICTOZA 18 MG/3ML SOPN   Other Relevant Orders   POCT glycosylated hemoglobin (Hb A1C) (Completed)   Lipid panel   COMPLETE METABOLIC PANEL WITH GFR   Hypothyroid Stable today on exam.  Medications tolerated without side effects.  Continue at current doses.  Refills provided.  Check labs today. Followup 6  months.    Relevant Medications   levothyroxine  (SYNTHROID, LEVOTHROID) 200 MCG tablet   levothyroxine (SYNTHROID, LEVOTHROID) 25 MCG tablet   Other Relevant Orders   TSH   COMPLETE METABOLIC PANEL WITH GFR     Musculoskeletal and Integument   Osteoarthritis Stable today on exam.  Medications tolerated without side effects.  Continue at current doses.  Refills provided.  Check labs today. Followup 6 months.    Relevant Medications   tiZANidine (ZANAFLEX) 4 MG tablet   diclofenac (VOLTAREN) 75 MG EC tablet   diclofenac sodium (VOLTAREN) 1 % GEL      Other Visit Diagnoses    Chronic bilateral low back pain with left-sided sciatica     Stable today on exam.  Medications tolerated without side effects.  Continue at current doses.  Refills provided.  Check labs today. Followup 6 months.    Relevant Medications   tiZANidine (ZANAFLEX) 4 MG tablet   diclofenac (VOLTAREN) 75 MG EC tablet   gabapentin (NEURONTIN) 400 MG capsule   Diabetes mellitus without complication (HCC)       Relevant Medications   Insulin Glargine, 2 Unit Dial, (TOUJEO MAX SOLOSTAR) 300 UNIT/ML SOPN   atorvastatin (LIPITOR) 10 MG tablet   gabapentin (NEURONTIN) 400 MG capsule   metFORMIN (GLUCOPHAGE-XR) 500 MG 24 hr tablet   NOVOLOG FLEXPEN 100 UNIT/ML FlexPen   VICTOZA 18 MG/3ML SOPN   Hyperlipidemia associated with type 2 diabetes mellitus (HCC)     UncontrolledDM with A1c 9.3% Worsening control from 3 months ago and goal A1c < 7.0%. - Complications - hyperlipidemia, peripheral neuropathy and hyperglycemia.  Plan:  1. Change therapy:  - INCREASE Toujeo to 70 units bid. 2. Encourage improved lifestyle: - low carb/low glycemic diet reinforced prior education - Increase physical activity to 30 minutes most days of the week.  Explained that increased physical activity increases body's use of sugar for energy. 3. Check fasting am CBG and bring log to next visit for review 4. Continue ASA, ACEi and Statin 5. Advised to schedule DM ophtho exam, send record. 6.  Follow-up 3 months.    Relevant Medications   Insulin Glargine, 2 Unit Dial, (TOUJEO MAX SOLOSTAR) 300 UNIT/ML SOPN   atorvastatin (LIPITOR) 10 MG tablet   metFORMIN (GLUCOPHAGE-XR) 500 MG 24 hr tablet   NOVOLOG FLEXPEN 100 UNIT/ML FlexPen   VICTOZA 18 MG/3ML SOPN   Acute rhinosinusitis     Stable today on exam.  Medications tolerated without side effects.  Continue at current doses.  Refills provided.  Check labs today. Followup 6 months.    Relevant Medications   levocetirizine (XYZAL) 5 MG tablet   Seasonal allergic rhinitis due to pollen       Relevant Medications   levocetirizine (XYZAL) 5 MG tablet      Meds ordered this encounter  Medications  . Insulin Glargine, 2 Unit Dial, (TOUJEO MAX SOLOSTAR) 300 UNIT/ML SOPN    Sig: Inject 70 Units into the skin 2 (two) times daily.    Dispense:  14 pen    Refill:  1    Order Specific Question:   Supervising Provider    Answer:   Smitty Cords [[id number]]  . tiZANidine (ZANAFLEX) 4 MG tablet    Sig: TAKE 1 TABLET(4 MG) BY MOUTH AT BEDTIME    Dispense:  30 tablet    Refill:  2    Order Specific Question:   Supervising Provider    Answer:   Smitty Cords [[id number]]  .  atorvastatin (LIPITOR) 10 MG tablet    Sig: Take 1 tablet (10 mg total) by mouth every Monday, Wednesday, and Friday at 6 PM.    Dispense:  14 tablet    Refill:  5    Order Specific Question:   Supervising Provider    Answer:   Smitty CordsKARAMALEGOS, ALEXANDER J [[id number]]  . budesonide-formoterol (SYMBICORT) 160-4.5 MCG/ACT inhaler    Sig: Inhale 2 puffs into the lungs 2 (two) times daily.    Dispense:  1 Inhaler    Refill:  5    Order Specific Question:   Supervising Provider    Answer:   Smitty CordsKARAMALEGOS, ALEXANDER J [[id number]]  . diclofenac (VOLTAREN) 75 MG EC tablet    Sig: TAKE 1 TABLET(75 MG) BY MOUTH TWICE DAILY    Dispense:  60 tablet    Refill:  5    Order Specific Question:   Supervising Provider    Answer:   Smitty CordsKARAMALEGOS, ALEXANDER J [[id number]]  . diclofenac  sodium (VOLTAREN) 1 % GEL    Sig: Apply 4 g topically 4 (four) times daily.    Dispense:  1 Tube    Refill:  2    Order Specific Question:   Supervising Provider    Answer:   Smitty CordsKARAMALEGOS, ALEXANDER J [[id number]]  . furosemide (LASIX) 40 MG tablet    Sig: Take 1 tablet (40 mg total) by mouth daily.    Dispense:  30 tablet    Refill:  3    Order Specific Question:   Supervising Provider    Answer:   Smitty CordsKARAMALEGOS, ALEXANDER J [[id number]]  . gabapentin (NEURONTIN) 400 MG capsule    Sig: Take 2 capsules (800 mg total) by mouth 4 (four) times daily.    Dispense:  240 capsule    Refill:  5    Order Specific Question:   Supervising Provider    Answer:   Smitty CordsKARAMALEGOS, ALEXANDER J [[id number]]  . levocetirizine (XYZAL) 5 MG tablet    Sig: TAKE 1 TABLET(5 MG) BY MOUTH EVERY EVENING    Dispense:  30 tablet    Refill:  5    Order Specific Question:   Supervising Provider    Answer:   Smitty CordsKARAMALEGOS, ALEXANDER J [[id number]]  . levothyroxine (SYNTHROID, LEVOTHROID) 200 MCG tablet    Sig: Take 1 tablet (200 mcg total) by mouth daily before breakfast.    Dispense:  30 tablet    Refill:  5    Order Specific Question:   Supervising Provider    Answer:   Smitty CordsKARAMALEGOS, ALEXANDER J [[id number]]  . levothyroxine (SYNTHROID, LEVOTHROID) 25 MCG tablet    Sig: TAKE 1 TABLET(25 MCG) BY MOUTH DAILY BEFORE BREAKFAST with 200 mcg tablet for 225 mcg total dose.    Dispense:  30 tablet    Refill:  5    Order Specific Question:   Supervising Provider    Answer:   Smitty CordsKARAMALEGOS, ALEXANDER J [[id number]]  . metFORMIN (GLUCOPHAGE-XR) 500 MG 24 hr tablet    Sig: TAKE 2 TABLETS(1000 MG) BY MOUTH TWICE DAILY WITH A MEAL    Dispense:  120 tablet    Refill:  5    Order Specific Question:   Supervising Provider    Answer:   Smitty CordsKARAMALEGOS, ALEXANDER J [[id number]]  . NOVOLOG FLEXPEN 100 UNIT/ML FlexPen    Sig: INJECT 20 UNITS UNDER THE SKIN TID BEFORE MEALS plus Sliding Scale correction up to 15 additional units per dose.    Dispense:  45 mL    Refill:  3  90- day supply.  If patient needs 30 day supply, provide 15 mL    Order Specific Question:   Supervising Provider    Answer:   Smitty CordsKARAMALEGOS, ALEXANDER J [[id number]]  . omeprazole (PRILOSEC) 40 MG capsule    Sig: TAKE 1 CAPSULE(40 MG) BY MOUTH TWICE DAILY    Dispense:  60 capsule    Refill:  5    Order Specific Question:   Supervising Provider    Answer:   Smitty CordsKARAMALEGOS, ALEXANDER J [[id number]]  . potassium chloride SA (K-DUR,KLOR-CON) 20 MEQ tablet    Sig: Take 1 tablet (20 mEq total) by mouth daily.    Dispense:  90 tablet    Refill:  1    Order Specific Question:   Supervising Provider    Answer:   Smitty CordsKARAMALEGOS, ALEXANDER J [[id number]]  . Tiotropium Bromide Monohydrate (SPIRIVA RESPIMAT) 1.25 MCG/ACT AERS    Sig: Inhale 2 puffs into the lungs daily.    Dispense:  2 Inhaler    Refill:  5    Order Specific Question:   Supervising Provider    Answer:   Smitty CordsKARAMALEGOS, ALEXANDER J [[id number]]  . VICTOZA 18 MG/3ML SOPN    Sig: ADMINISTER 1.8 MG UNDER THE SKIN DAILY    Dispense:  9 mL    Refill:  3    Order Specific Question:   Supervising Provider    Answer:   Smitty CordsKARAMALEGOS, ALEXANDER J [[id number]]    Follow up plan: Return in about 3 months (around 02/01/2019) for diabetes.  Wilhelmina McardleLauren Giancarlo Askren, DNP, AGPCNP-BC Adult Gerontology Primary Care Nurse Practitioner [facility] 11/02/2018, 2:43 PM

## 2018-11-03 ENCOUNTER — Other Ambulatory Visit: Payer: Self-pay | Admitting: Nurse Practitioner

## 2018-11-03 DIAGNOSIS — E119 Type 2 diabetes mellitus without complications: Secondary | ICD-10-CM

## 2018-11-03 LAB — POCT GLYCOSYLATED HEMOGLOBIN (HGB A1C): Hemoglobin A1C: 9.3 % — AB (ref 4.0–5.6)

## 2018-11-03 MED ORDER — METFORMIN HCL ER 500 MG PO TB24: ORAL_TABLET | ORAL | 5 refills | Status: DC

## 2018-11-03 MED ORDER — ATORVASTATIN CALCIUM 10 MG PO TABS: 10.0000 mg | ORAL_TABLET | ORAL | 5 refills | Status: DC

## 2018-11-03 MED ORDER — LEVOTHYROXINE SODIUM 200 MCG PO TABS: 200.0000 ug | ORAL_TABLET | Freq: Every day | ORAL | 5 refills | Status: DC

## 2018-11-03 MED ORDER — DICLOFENAC SODIUM 1 % TD GEL: 4.0000 g | Freq: Four times a day (QID) | TRANSDERMAL | 2 refills | Status: DC

## 2018-11-03 MED ORDER — TESTOSTERONE CYPIONATE 200 MG/ML IM SOLN: INTRAMUSCULAR | 2 refills | Status: DC

## 2018-11-03 MED ORDER — BUDESONIDE-FORMOTEROL FUMARATE 160-4.5 MCG/ACT IN AERO: 2.0000 | INHALATION_SPRAY | Freq: Two times a day (BID) | RESPIRATORY_TRACT | 5 refills | Status: DC

## 2018-11-03 MED ORDER — POTASSIUM CHLORIDE CRYS ER 20 MEQ PO TBCR: 20.0000 meq | EXTENDED_RELEASE_TABLET | Freq: Every day | ORAL | 1 refills | Status: DC

## 2018-11-03 MED ORDER — DICLOFENAC SODIUM 75 MG PO TBEC: DELAYED_RELEASE_TABLET | ORAL | 5 refills | Status: DC

## 2018-11-03 MED ORDER — VICTOZA 18 MG/3ML ~~LOC~~ SOPN: PEN_INJECTOR | SUBCUTANEOUS | 3 refills | Status: DC

## 2018-11-03 MED ORDER — GABAPENTIN 400 MG PO CAPS: 800.0000 mg | ORAL_CAPSULE | Freq: Four times a day (QID) | ORAL | 5 refills | Status: DC

## 2018-11-03 MED ORDER — NOVOLOG FLEXPEN 100 UNIT/ML ~~LOC~~ SOPN: PEN_INJECTOR | SUBCUTANEOUS | 3 refills | Status: DC

## 2018-11-03 MED ORDER — FUROSEMIDE 40 MG PO TABS: 40.0000 mg | ORAL_TABLET | Freq: Every day | ORAL | 3 refills | Status: DC

## 2018-11-03 MED ORDER — OMEPRAZOLE 40 MG PO CPDR: DELAYED_RELEASE_CAPSULE | ORAL | 5 refills | Status: DC

## 2018-11-03 MED ORDER — LEVOTHYROXINE SODIUM 25 MCG PO TABS: ORAL_TABLET | ORAL | 5 refills | Status: DC

## 2018-11-03 MED ORDER — LEVOCETIRIZINE DIHYDROCHLORIDE 5 MG PO TABS: ORAL_TABLET | ORAL | 5 refills | Status: DC

## 2018-11-03 MED ORDER — TIOTROPIUM BROMIDE MONOHYDRATE 1.25 MCG/ACT IN AERS: 2.0000 | INHALATION_SPRAY | Freq: Every day | RESPIRATORY_TRACT | 5 refills | Status: DC

## 2018-11-07 ENCOUNTER — Ambulatory Visit: Payer: Medicare HMO

## 2018-11-08 DIAGNOSIS — J45909 Unspecified asthma, uncomplicated: Secondary | ICD-10-CM | POA: Diagnosis not present

## 2018-11-08 DIAGNOSIS — I10 Essential (primary) hypertension: Secondary | ICD-10-CM | POA: Diagnosis not present

## 2018-11-08 DIAGNOSIS — J9611 Chronic respiratory failure with hypoxia: Secondary | ICD-10-CM | POA: Diagnosis not present

## 2018-11-09 ENCOUNTER — Encounter: Payer: Self-pay | Admitting: Nurse Practitioner

## 2018-11-10 ENCOUNTER — Other Ambulatory Visit: Payer: Self-pay | Admitting: Surgery

## 2018-11-12 ENCOUNTER — Other Ambulatory Visit: Payer: Self-pay | Admitting: Nurse Practitioner

## 2018-11-12 DIAGNOSIS — E119 Type 2 diabetes mellitus without complications: Secondary | ICD-10-CM

## 2018-11-13 ENCOUNTER — Telehealth: Payer: Self-pay | Admitting: Nurse Practitioner

## 2018-11-13 DIAGNOSIS — Z794 Long term (current) use of insulin: Secondary | ICD-10-CM

## 2018-11-13 DIAGNOSIS — E1143 Type 2 diabetes mellitus with diabetic autonomic (poly)neuropathy: Secondary | ICD-10-CM

## 2018-11-13 MED ORDER — INSULIN LISPRO (1 UNIT DIAL) 100 UNIT/ML (KWIKPEN): PEN_INJECTOR | SUBCUTANEOUS | 4 refills | Status: DC

## 2018-11-13 NOTE — Telephone Encounter (Signed)
[facility] have made this [facility]

## 2018-11-13 NOTE — Telephone Encounter (Signed)
Pt needs a refill on humolog.  Please call [phone]

## 2018-11-13 NOTE — Telephone Encounter (Signed)
The pt state his insurance changed and now they are requiring him to get Humalog instead of Novalog. Can you please send a new prescription.

## 2018-11-17 ENCOUNTER — Ambulatory Visit: Payer: Medicare HMO

## 2018-11-17 ENCOUNTER — Ambulatory Visit: Payer: Medicare HMO | Attending: Nurse Practitioner
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
## 2018-11-21 ENCOUNTER — Ambulatory Visit
Admission: RE | Admit: 2018-11-21 | Discharge: 2018-11-21 | Disposition: A | Discharge status: Home / Self Care | Payer: Medicare Other | Source: Ambulatory Visit | Attending: Internal Medicine | Admitting: Internal Medicine

## 2018-11-21 ENCOUNTER — Ambulatory Visit: Payer: Medicare HMO

## 2018-11-21 ENCOUNTER — Encounter (INDEPENDENT_AMBULATORY_CARE_PROVIDER_SITE_OTHER): Payer: Self-pay

## 2018-11-21 ENCOUNTER — Encounter: Payer: Self-pay | Admitting: Internal Medicine

## 2018-11-21 ENCOUNTER — Ambulatory Visit (INDEPENDENT_AMBULATORY_CARE_PROVIDER_SITE_OTHER): Payer: Medicare Other | Admitting: Internal Medicine

## 2018-11-21 VITALS — BP 134/90 | HR 80 | Resp 16 | Ht 75.0 in | Wt >= 6400 oz

## 2018-11-21 DIAGNOSIS — R0600 Dyspnea, unspecified: Secondary | ICD-10-CM | POA: Insufficient documentation

## 2018-11-21 DIAGNOSIS — R0602 Shortness of breath: Secondary | ICD-10-CM | POA: Diagnosis not present

## 2018-11-21 NOTE — Patient Instructions (Addendum)
Continue using symbicort, rinse mouth after use. Can stop spiriva.  Will send for blood gas test.  Will check Chest x ray today.

## 2018-11-23 ENCOUNTER — Ambulatory Visit: Admission: RE | Admit: 2018-11-23 | Payer: Medicare Other | Source: Ambulatory Visit

## 2018-11-23 ENCOUNTER — Ambulatory Visit
Admission: RE | Admit: 2018-11-23 | Discharge: 2018-11-23 | Disposition: A | Discharge status: Home / Self Care | Payer: Medicare Other | Source: Ambulatory Visit | Attending: Surgery | Admitting: Surgery

## 2018-11-23 DIAGNOSIS — K219 Gastro-esophageal reflux disease without esophagitis: Secondary | ICD-10-CM | POA: Diagnosis not present

## 2018-11-23 DIAGNOSIS — Z01818 Encounter for other preprocedural examination: Secondary | ICD-10-CM | POA: Diagnosis not present

## 2018-11-24 ENCOUNTER — Ambulatory Visit: Payer: Medicare Other | Attending: Internal Medicine

## 2018-11-24 DIAGNOSIS — R0600 Dyspnea, unspecified: Secondary | ICD-10-CM | POA: Insufficient documentation

## 2018-11-24 LAB — BLOOD GAS, ARTERIAL
ACID-BASE EXCESS: 4.2 mmol/L — AB (ref 0.0–2.0)
Bicarbonate: 29.2 mmol/L — ABNORMAL HIGH (ref 20.0–28.0)
FIO2: 0.21
O2 Saturation: 93.8 %
Patient temperature: 37
pCO2 arterial: 44 mmHg (ref 32.0–48.0)
pH, Arterial: 7.43 (ref 7.350–7.450)
pO2, Arterial: 68 mmHg — ABNORMAL LOW (ref 83.0–108.0)

## 2018-11-30 DIAGNOSIS — E1140 Type 2 diabetes mellitus with diabetic neuropathy, unspecified: Secondary | ICD-10-CM | POA: Diagnosis not present

## 2018-11-30 DIAGNOSIS — Z794 Long term (current) use of insulin: Secondary | ICD-10-CM | POA: Diagnosis not present

## 2018-11-30 DIAGNOSIS — B351 Tinea unguium: Secondary | ICD-10-CM | POA: Diagnosis not present

## 2018-12-07 ENCOUNTER — Telehealth: Payer: Self-pay | Admitting: Internal Medicine

## 2018-12-07 NOTE — Telephone Encounter (Signed)
Both normal. No new recs.

## 2018-12-08 ENCOUNTER — Encounter: Payer: Medicare Other | Attending: Surgery | Admitting: Dietician

## 2018-12-08 ENCOUNTER — Encounter: Payer: Self-pay | Admitting: Dietician

## 2018-12-08 VITALS — Ht 75.0 in | Wt >= 6400 oz

## 2018-12-08 DIAGNOSIS — Z6841 Body Mass Index (BMI) 40.0 and over, adult: Secondary | ICD-10-CM | POA: Insufficient documentation

## 2018-12-08 DIAGNOSIS — E119 Type 2 diabetes mellitus without complications: Secondary | ICD-10-CM | POA: Diagnosis not present

## 2018-12-08 DIAGNOSIS — Z713 Dietary counseling and surveillance: Secondary | ICD-10-CM | POA: Diagnosis not present
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
## 2018-12-08 NOTE — Telephone Encounter (Signed)
Pt notified that both are normal.

## 2018-12-08 NOTE — Patient Instructions (Signed)
   Gradually decrease caffeine in coffee and tea -- try blending regular and decaf, and gradually more and more decaf.  Continue to limit carbs/ starchy foods for blood sugar control and calorie control. Ideally keep to 30-45grams of carbs with each meal or less.   Check protein drinks to find some that meet bariatric goals -- more than 15grams of protein and 5grams or less of carbohydrate.   Keep up your regular exercise, great job!

## 2018-12-09 DIAGNOSIS — J45909 Unspecified asthma, uncomplicated: Secondary | ICD-10-CM | POA: Diagnosis not present

## 2018-12-25 ENCOUNTER — Telehealth: Payer: Self-pay | Admitting: Nurse Practitioner

## 2018-12-25 NOTE — Telephone Encounter (Signed)
Pt has concerns about corona virus and also has a question about condenser [phone]

## 2018-12-26 ENCOUNTER — Telehealth: Payer: Self-pay | Admitting: Internal Medicine

## 2018-12-26 DIAGNOSIS — J449 Chronic obstructive pulmonary disease, unspecified: Secondary | ICD-10-CM

## 2018-12-26 NOTE — Telephone Encounter (Signed)
I spoke with the patient and clarified all his concerns.

## 2018-12-26 NOTE — Telephone Encounter (Signed)
Yes, he may be evaluated by Christoper Allegra for POC.

## 2018-12-26 NOTE — Telephone Encounter (Signed)
Called and spoke to pt.  Pt states he currently wears 2L O2 cont.  Pt is requesting that an order be placed to [facility] for evaluation of POC.  DR please advise. Thanks

## 2018-12-26 NOTE — Telephone Encounter (Signed)
Pt is aware of below message and voiced his understanding. Order has been placed to [facility] for eval of POC. Nothing further is needed.

## 2018-12-27 ENCOUNTER — Telehealth: Payer: Self-pay | Admitting: Dietician

## 2018-12-27 NOTE — Telephone Encounter (Signed)
Called patient to return message from him to cancel his supervised weight loss visit for tomorrow 12/28/18. He reports not feeling well. Left a message for Philip [name] with currently available dates and times for the remainder of the month. Reminded patient that he will need a visit before the end of the month to avoid having to restart the monthly visits. Requested a call back.

## 2018-12-28 ENCOUNTER — Ambulatory Visit: Payer: Medicare Other | Admitting: Dietician

## 2019-01-01 NOTE — Telephone Encounter (Signed)
Called and spoke with patient who had 02 previously in [place], [place] [address]. Pt has been receiving 02 (Concentrator Only) no portable tanks from [facility] here since 2016.  In order for pt to be given portable tanks and to Eval for POC pt will need a SMW in order to qualify for portable tanks.   Please advise. Rhonda J Cobb

## 2019-01-01 NOTE — Telephone Encounter (Signed)
Called and spoke to pt and made him aware of this information. Pt is aware that we are not currently scheduling SMW, due to covid-19. Pt voiced his understanding. Pt's face sheet has been placed in reschedule folder to schedule when able.  Nothing further is needed at this time.

## 2019-01-01 NOTE — Telephone Encounter (Addendum)
Philip [name] received message from [place] questioning when pt was set up on oxygen.  Pt states he was started on oxygen in [place]. Pt is unsure if Philip [name] is who provided oxygen to him in [facility], however he is currently receiving oxygen from [place].   Will route to [facility] to make aware.

## 2019-01-02 ENCOUNTER — Telehealth: Payer: Self-pay | Admitting: Dietician

## 2019-01-02 ENCOUNTER — Other Ambulatory Visit: Payer: Self-pay | Admitting: Nurse Practitioner

## 2019-01-02 DIAGNOSIS — E039 Hypothyroidism, unspecified: Secondary | ICD-10-CM

## 2019-01-02 DIAGNOSIS — M159 Polyosteoarthritis, unspecified: Secondary | ICD-10-CM

## 2019-01-02 DIAGNOSIS — Z794 Long term (current) use of insulin: Secondary | ICD-10-CM

## 2019-01-02 DIAGNOSIS — K219 Gastro-esophageal reflux disease without esophagitis: Secondary | ICD-10-CM

## 2019-01-02 DIAGNOSIS — M5442 Lumbago with sciatica, left side: Secondary | ICD-10-CM

## 2019-01-02 DIAGNOSIS — J301 Allergic rhinitis due to pollen: Secondary | ICD-10-CM

## 2019-01-02 DIAGNOSIS — E1169 Type 2 diabetes mellitus with other specified complication: Secondary | ICD-10-CM

## 2019-01-02 DIAGNOSIS — E119 Type 2 diabetes mellitus without complications: Secondary | ICD-10-CM

## 2019-01-02 DIAGNOSIS — J449 Chronic obstructive pulmonary disease, unspecified: Secondary | ICD-10-CM

## 2019-01-02 DIAGNOSIS — G8929 Other chronic pain: Secondary | ICD-10-CM

## 2019-01-02 DIAGNOSIS — M150 Primary generalized (osteo)arthritis: Secondary | ICD-10-CM

## 2019-01-02 DIAGNOSIS — E1143 Type 2 diabetes mellitus with diabetic autonomic (poly)neuropathy: Secondary | ICD-10-CM

## 2019-01-02 DIAGNOSIS — E785 Hyperlipidemia, unspecified: Secondary | ICD-10-CM

## 2019-01-02 DIAGNOSIS — F319 Bipolar disorder, unspecified: Secondary | ICD-10-CM

## 2019-01-02 MED ORDER — LEVOCETIRIZINE DIHYDROCHLORIDE 5 MG PO TABS: ORAL_TABLET | ORAL | 1 refills | Status: DC

## 2019-01-02 MED ORDER — METFORMIN HCL ER 500 MG PO TB24: ORAL_TABLET | ORAL | 1 refills | Status: AC

## 2019-01-02 MED ORDER — ATORVASTATIN CALCIUM 10 MG PO TABS: 10.0000 mg | ORAL_TABLET | ORAL | 1 refills | Status: DC

## 2019-01-02 MED ORDER — GABAPENTIN 400 MG PO CAPS: 800.0000 mg | ORAL_CAPSULE | Freq: Four times a day (QID) | ORAL | 1 refills | Status: AC

## 2019-01-02 MED ORDER — TIZANIDINE HCL 4 MG PO TABS: ORAL_TABLET | ORAL | 2 refills | Status: DC

## 2019-01-02 MED ORDER — OMEPRAZOLE 40 MG PO CPDR: DELAYED_RELEASE_CAPSULE | ORAL | 1 refills | Status: AC

## 2019-01-02 MED ORDER — DICLOFENAC SODIUM 1 % TD GEL: 4.0000 g | Freq: Four times a day (QID) | TRANSDERMAL | 1 refills | Status: AC

## 2019-01-02 MED ORDER — TIOTROPIUM BROMIDE MONOHYDRATE 1.25 MCG/ACT IN AERS: 2.0000 | INHALATION_SPRAY | Freq: Every day | RESPIRATORY_TRACT | 1 refills | Status: DC

## 2019-01-02 MED ORDER — POTASSIUM CHLORIDE CRYS ER 20 MEQ PO TBCR: 20.0000 meq | EXTENDED_RELEASE_TABLET | Freq: Every day | ORAL | 1 refills | Status: AC

## 2019-01-02 MED ORDER — VICTOZA 18 MG/3ML ~~LOC~~ SOPN: PEN_INJECTOR | SUBCUTANEOUS | 1 refills | Status: DC

## 2019-01-02 MED ORDER — FUROSEMIDE 40 MG PO TABS: 40.0000 mg | ORAL_TABLET | Freq: Every day | ORAL | 1 refills | Status: AC

## 2019-01-02 MED ORDER — INSULIN GLARGINE (2 UNIT DIAL) 300 UNIT/ML ~~LOC~~ SOPN: 70.0000 [IU] | PEN_INJECTOR | Freq: Two times a day (BID) | SUBCUTANEOUS | 1 refills | Status: DC

## 2019-01-02 MED ORDER — BUPROPION HCL ER (SR) 200 MG PO TB12: ORAL_TABLET | ORAL | 1 refills | Status: DC

## 2019-01-02 MED ORDER — LISINOPRIL 40 MG PO TABS: 40.0000 mg | ORAL_TABLET | Freq: Every day | ORAL | 1 refills | Status: AC

## 2019-01-02 MED ORDER — BUDESONIDE-FORMOTEROL FUMARATE 160-4.5 MCG/ACT IN AERO: 2.0000 | INHALATION_SPRAY | Freq: Two times a day (BID) | RESPIRATORY_TRACT | 1 refills | Status: AC

## 2019-01-02 MED ORDER — ALBUTEROL SULFATE HFA 108 (90 BASE) MCG/ACT IN AERS: INHALATION_SPRAY | RESPIRATORY_TRACT | 1 refills | Status: AC

## 2019-01-02 MED ORDER — LEVOTHYROXINE SODIUM 25 MCG PO TABS: ORAL_TABLET | ORAL | 1 refills | Status: AC

## 2019-01-02 MED ORDER — DICLOFENAC SODIUM 75 MG PO TBEC: DELAYED_RELEASE_TABLET | ORAL | 1 refills | Status: DC

## 2019-01-02 MED ORDER — LEVOTHYROXINE SODIUM 200 MCG PO TABS: 200.0000 ug | ORAL_TABLET | Freq: Every day | ORAL | 1 refills | Status: AC

## 2019-01-02 NOTE — Telephone Encounter (Signed)
Pt has not received mail order refills and wants to check to see if they were sent to [facility] mail order [phone]

## 2019-01-02 NOTE — Telephone Encounter (Signed)
Returned voicemail message from patient inquiring about his upcoming appointment scheduled for 01/05/19. Left message assuring patient of measures to keep room sanitized and that our office is open at this time, and that he would receive notification if anything changes. Offered to reschedule appointment if patient prefers.

## 2019-01-05 ENCOUNTER — Other Ambulatory Visit: Payer: Self-pay

## 2019-01-05 ENCOUNTER — Encounter: Payer: Medicare Other | Attending: Surgery | Admitting: Dietician

## 2019-01-05 ENCOUNTER — Encounter: Payer: Self-pay | Admitting: Dietician

## 2019-01-05 VITALS — Ht 75.0 in | Wt >= 6400 oz

## 2019-01-05 DIAGNOSIS — Z713 Dietary counseling and surveillance: Secondary | ICD-10-CM | POA: Insufficient documentation

## 2019-01-05 DIAGNOSIS — E119 Type 2 diabetes mellitus without complications: Secondary | ICD-10-CM | POA: Diagnosis not present

## 2019-01-05 DIAGNOSIS — Z6841 Body Mass Index (BMI) 40.0 and over, adult: Secondary | ICD-10-CM | POA: Diagnosis not present

## 2019-01-05 DIAGNOSIS — E1165 Type 2 diabetes mellitus with hyperglycemia: Secondary | ICD-10-CM

## 2019-01-05 NOTE — Progress Notes (Signed)
Appt start time: 1300 end time:  1330.  Assessment:   #1 SWL Appointment.   Start Wt at NDES: 419.3lbs Wt: 413.7lbs Ht: 6'3" BMI: 51.71  Preferred Learning Style:   Auditory   Learning Readiness:   Change in progress  MEDICATIONS: albuterol inhaler, aspirin, atorvastatin, budesonide-formoterol inhaler, buPROPion, citalopram, fluticasone nasal spray, furosemide, gabapentin, Insulin Glargine 300U/ml, insulin lispro, levocetirizine, levothyroxine, lisinopril, melatonin, metFORMIN, multivitamin, omeprazole, potassium chloride, sildenafil, tamsulosin, testosterone injection, tiotropium bromide monohydrate inhaler, tiZANidine, VICTOZA, Vitamin D, ziprasidone  Progress:    Weight loss of 5.6lbs since previous visit on 12/08/18.   Patient reports some increased activity with lawn care + walking  Keeps glucose tablets on hand in case of low BG, which happens when active.   Has further reduced portions of starchy foods and food portions in general.   DIETARY INTAKE: 24-hr recall:  Breakfast: egg(s) with 1 slice toast  Snack: Ensure Max protein (6g carb incl. 1g sugar) Lunch: chicken breast grilled with veg ie green beans, peas (fist-size portion -- 1cup or less) Snack: none Dinner: grilled chicken and veg Snack: tortilla chips or protein bar Beverages: water, tea with Splenda, occasional coffee with creamer, no sugar  Usual physical activity: walking 60 minutes 5x a week + lawn care -- mowing and other chores  Diet to Follow: 30g carbohydrates Continued portion control and eating at regular intervals              Nutritional Diagnosis:  Gerrard-3.3 Overweight/obesity related to history of excess calories and physical inactivity as evidenced by patient current BMI of 51.7 following dietary guidelines for continued weight loss prior to bariatric surgery.              Intervention:   . Nutrition counseling for weight control prior to bariatric surgery. . Reviewed goals for carb intake  and consistency to promote healthy blood sugars.  . Commended patient for changes made and success with weight loss in the past weeks.  . Patient is motivated to continue with weight loss efforts and preparation for surgery. He voices eagerness to lose weight and improve his health.   Teaching Method Utilized:  Visual Auditory  Handouts given during visit include:  Goals and Instructions   Barriers to learning/adherence to lifestyle change: none  Demonstrated degree of understanding via:  Teach Back   Monitoring/Evaluation:  Dietary intake, exercise, BG control, and body weight 02/02/19.

## 2019-01-05 NOTE — Patient Instructions (Signed)
   Keep up your healthy food choices and balanced eating, great job!  Continue with regular exercise also.

## 2019-01-07 DIAGNOSIS — J45909 Unspecified asthma, uncomplicated: Secondary | ICD-10-CM | POA: Diagnosis not present

## 2019-01-08 ENCOUNTER — Other Ambulatory Visit: Payer: Self-pay

## 2019-01-08 DIAGNOSIS — E1143 Type 2 diabetes mellitus with diabetic autonomic (poly)neuropathy: Secondary | ICD-10-CM

## 2019-01-08 DIAGNOSIS — Z794 Long term (current) use of insulin: Secondary | ICD-10-CM

## 2019-01-08 MED ORDER — INSULIN LISPRO (1 UNIT DIAL) 100 UNIT/ML (KWIKPEN): PEN_INJECTOR | SUBCUTANEOUS | 4 refills | Status: AC

## 2019-01-09 ENCOUNTER — Ambulatory Visit: Payer: Medicare HMO

## 2019-01-19 ENCOUNTER — Other Ambulatory Visit: Payer: Self-pay

## 2019-01-24 ENCOUNTER — Encounter: Payer: Self-pay | Admitting: Nurse Practitioner

## 2019-01-24 ENCOUNTER — Other Ambulatory Visit: Payer: Self-pay

## 2019-01-24 ENCOUNTER — Ambulatory Visit (INDEPENDENT_AMBULATORY_CARE_PROVIDER_SITE_OTHER): Payer: Medicare Other | Admitting: Nurse Practitioner

## 2019-01-24 VITALS — BP 147/77 | HR 67

## 2019-01-24 DIAGNOSIS — E1143 Type 2 diabetes mellitus with diabetic autonomic (poly)neuropathy: Secondary | ICD-10-CM

## 2019-01-24 DIAGNOSIS — Z794 Long term (current) use of insulin: Secondary | ICD-10-CM | POA: Diagnosis not present

## 2019-01-24 NOTE — Progress Notes (Signed)
Telemedicine Encounter: Disclosed to patient at start of encounter that we will provide appropriate telemedicine services.  Patient consents to be treated via phone prior to discussion. - Patient is at his home and is accessed via telephone. - Services are provided by [name] from [facility].  Subjective:    Patient ID: Philip [name], male    DOB: [birth year], 58 y.o.   MRN: [id number]  Philip [name] is a 58 y.o. male presenting on 01/24/2019 for Diabetes (pt state his glucometer went out. )  HPI  , Diabetes Pt presents today for follow up of Type 2 diabetes mellitus. He is checking fasting am CBG at home with a range of below 100 during evening.  In am was 180-200 over last several weeks. Patient is eating a bedtime snack after CBG below 100. Snack is usually 1 pack of 6 Lance crackers, Bugles, or sugar free snack bar.  Patient eats in response to hunger.  - HIGHEST: 4/9 644 pm had CBG 250; 3/11 459 am CBG 419; 3/6 0718 CBG 331   - Patient continues with bariatric surgery nutritional counseling.  Surgery will be postponed until about July most likely.   - Current diabetic medications include: Humalog 20 units with meals plus correction scale, Toujeo 70 units twice daily - He is not currently symptomatic.  - He denies polydipsia, polyphagia, polyuria, headaches, diaphoresis, shakiness, chills, pain, numbness or tingling in extremities and changes in vision.   - Clinical course has been worsening. - He  reports no regular exercise routine. - His diet is moderate in salt, high in fat, and high in carbohydrates.   PREVENTION:   Eye exam current (within one year): no - delayed due to Covid-19 precautions  Foot exam current (within one year): yes Lipid/ASCVD risk reduction - on statin: yes Kidney protection - on ace or arb: yes Recent Labs    04/20/18 1358 07/27/18 1451 11/03/18 1700  HGBA1C 8.2* 8.9* 9.3*   Social History   Tobacco Use  . Smoking  status: Never Smoker  . Smokeless tobacco: Never Used  Substance Use Topics  . Alcohol use: Yes    Comment: rarely, once a year  . Drug use: No   Review of Systems Per HPI unless specifically indicated above    Objective:    BP (!) 147/77   Pulse 67   Wt Readings from Last 3 Encounters:  01/05/19 (!) 413 lb 11.2 oz (187.7 kg)  12/08/18 (!) 419 lb 4.8 oz (190.2 kg)  11/21/18 (!) 419 lb (190.1 kg)    Physical Exam Patient remotely monitored.  Verbal communication appropriate.  Cognition normal.   Results for orders placed or performed in visit on 11/24/18  Blood gas  Result Value Ref Range   FIO2 0.21    Delivery systems ROOM AIR    pH, Arterial 7.43 7.350 - 7.450   pCO2 arterial 44 32.0 - 48.0 mmHg   pO2, Arterial 68 (L) 83.0 - 108.0 mmHg   Bicarbonate 29.2 (H) 20.0 - 28.0 mmol/L   Acid-Base Excess 4.2 (H) 0.0 - 2.0 mmol/L   O2 Saturation 93.8 %   Patient temperature 37.0    Collection site RIGHT RADIAL    Sample type ARTERIAL DRAW    Allens test (pass/fail) PASS PASS      Assessment & Plan:   Problem List Items Addressed This Visit      Endocrine   Type 2 diabetes mellitus with autonomic neuropathy (HCC) - Primary  Continues to be uncontrolled.  AM CBG significantly higher than goal, but with reported evening CBG less than 100 prior to bedtime snack.  Review of snacks reveals poor choices without ability to process glucose elevation outside of additional insulin bedtime dose for carb coverage.   - No A1c available today  Plan: 1. Labs in next 7 days.  Then, will make treatment adjustments based on A1c, lipid, CMP. - Likely will need increase in Toujeo at this time to keep risks of hypoglycemia lower if increasing Humalog.  Imbalance between basal:bolus ratio.  Patient is best candidate for Endocrinology, but was denied by [facility] end[facility] due to no-shows.  Would possibly be a good U-500 candidate, but will not change to this in primary care.   - Could consider  future carb counting and dosage of bolus insulin per carb count instead of meal dose plus correction scale. 2. Reviewed with patient need to keep A1c < 8.0% for best surgical outcomes.  Reinforced this is his responsibility prior to surgery and for best results after surgery completed. 3. Encouraged patient to get ophthalmology exam. 4. FOLLOW-UP 3-4 months unless indicated to come sooner for A1c > 8.0% and med management.    - Time spent in direct consultation with patient via telemedicine about above concerns: 15 minutes  Follow up plan: Return in about 4 months (around 05/14/2019) for diabetes.  Wilhelmina McardleLauren Kinnick Maus, DNP, AGPCNP-BC Adult Gerontology Primary Care Nurse Practitioner [facility] 01/24/2019, 10:27 AM

## 2019-01-25 ENCOUNTER — Encounter: Payer: Self-pay | Admitting: Nurse Practitioner

## 2019-02-02 ENCOUNTER — Ambulatory Visit: Payer: Medicare Other | Admitting: Dietician

## 2019-02-02 ENCOUNTER — Telehealth: Payer: Self-pay | Admitting: Dietician

## 2019-02-02 NOTE — Telephone Encounter (Signed)
Received call from patient regarding his appointment scheduled for today; he forgot about the appointment and received a reminder call via phone tree a few minutes before the scheduled time. He has rescheduled to 03/07/19.

## 2019-02-05 ENCOUNTER — Ambulatory Visit: Payer: Medicare HMO | Admitting: Nurse Practitioner

## 2019-02-05 ENCOUNTER — Encounter: Payer: Medicare Other | Attending: Surgery | Admitting: Dietician

## 2019-02-05 ENCOUNTER — Other Ambulatory Visit: Payer: Self-pay

## 2019-02-05 ENCOUNTER — Encounter: Payer: Self-pay | Admitting: Dietician

## 2019-02-05 VITALS — Ht 75.0 in | Wt >= 6400 oz

## 2019-02-05 DIAGNOSIS — Z6841 Body Mass Index (BMI) 40.0 and over, adult: Secondary | ICD-10-CM | POA: Insufficient documentation

## 2019-02-05 DIAGNOSIS — Z713 Dietary counseling and surveillance: Secondary | ICD-10-CM | POA: Diagnosis not present

## 2019-02-05 DIAGNOSIS — E1165 Type 2 diabetes mellitus with hyperglycemia: Secondary | ICD-10-CM

## 2019-02-05 DIAGNOSIS — E119 Type 2 diabetes mellitus without complications: Secondary | ICD-10-CM | POA: Insufficient documentation

## 2019-02-05 NOTE — Progress Notes (Signed)
Appt start time: 1430 end time:  1515.  Assessment:   #2 SWL Appointment.   Start Wt at NDES: 419.3lbs Wt: 416.3lbs Ht: 6'3" BMI: 52.03  Preferred Learning Style:   Auditory   Learning Readiness:   Change in progress  MEDICATIONS: albuterol inhaler, aspirin, atorvastatin, budesonide-formoterol inhaler, buPROPion, citalopram, fluticasone nasal spray, furosemide, gabapentin, Insulin Glargine 300U/ml, insulin lispro, levocetirizine, levothyroxine, lisinopril, melatonin, metFORMIN, multivitamin, omeprazole, potassium chloride, sildenafil, tamsulosin, testosterone injection, tiotropium bromide monohydrate inhaler, tiZANidine, VICTOZA, Vitamin D, ziprasidone  Progress:  Weight increased by 2.6lbs since previous visit on 01/05/19. Patient is unsure of reason.   Has been working to control carb intake; did eat some easter candy ie jelly beans  Has not been taking diuretic meds recently because he has been sleeping later (does note want to take med later as he will end up having to urinate during the night)  Uses small plates for meals.   DIETARY INTAKE: 24-hr recall:  Breakfast: 1-2 eggs, 1 slice toast  Snack: none  Lunch: grilled chicken with veg ie green bean, peas  Snack: none Dinner: chicken or fish, occ pork tenderloin or lean beef and veg; did have spaghetti last week; generally low-carb meals Snack: none; drinks decaf tea Beverages: water, tea with Splenda  Usual physical activity: walking 60 minutes, 5x a week + lawn care  Diet to Follow: 30 g carbohydrates Emphasis on lean proteins and low-carb vegetables               Nutritional Diagnosis:  [place]-3.3 Overweight/obesity related to history of excess calories and physical inactivity as evidenced by patient current BMI of 52 following dietary guidelines for continued weight loss prior to bariatric surgery.              Intervention:   . Nutrition counseling for weight loss prior to upcoming bariatric surgery. . Reviewed  progress since previous visit.  Marland Kitchen Discussed options for frozen meals. . Discussed importance of chewing foods thoroughly.   Teaching Method Utilized:  Visual Auditory Hands on  Handouts given during visit include:  Goals and instructions  Barriers to learning/adherence to lifestyle change: none  Demonstrated degree of understanding via:  Teach Back   Monitoring/Evaluation:  Dietary intake, exercise, BG control, and body weight 03/01/19.

## 2019-02-05 NOTE — Patient Instructions (Signed)
   Great job making healthy food choices, keep up the good work.  OK to eat Healthy Choice or Lean Cuisine meals especially if under 30grams carbohydrate.   Continue with regular exercise as much as able.

## 2019-02-07 ENCOUNTER — Ambulatory Visit: Payer: Medicare Other | Admitting: Family Medicine

## 2019-02-07 ENCOUNTER — Other Ambulatory Visit: Payer: Self-pay

## 2019-02-07 DIAGNOSIS — J45909 Unspecified asthma, uncomplicated: Secondary | ICD-10-CM | POA: Diagnosis not present

## 2019-02-09 ENCOUNTER — Encounter: Payer: Self-pay | Admitting: Emergency Medicine

## 2019-02-09 ENCOUNTER — Emergency Department
Admission: EM | Admit: 2019-02-09 | Discharge: 2019-02-09 | Disposition: A | Discharge status: Home / Self Care | Payer: Medicare Other | Attending: Emergency Medicine | Admitting: Emergency Medicine

## 2019-02-09 ENCOUNTER — Other Ambulatory Visit: Payer: Self-pay

## 2019-02-09 ENCOUNTER — Emergency Department: Payer: Medicare Other

## 2019-02-09 DIAGNOSIS — L723 Sebaceous cyst: Secondary | ICD-10-CM | POA: Insufficient documentation

## 2019-02-09 DIAGNOSIS — Z91040 Latex allergy status: Secondary | ICD-10-CM | POA: Diagnosis not present

## 2019-02-09 DIAGNOSIS — L03313 Cellulitis of chest wall: Secondary | ICD-10-CM | POA: Diagnosis not present

## 2019-02-09 DIAGNOSIS — Z7982 Long term (current) use of aspirin: Secondary | ICD-10-CM | POA: Diagnosis not present

## 2019-02-09 DIAGNOSIS — J449 Chronic obstructive pulmonary disease, unspecified: Secondary | ICD-10-CM | POA: Insufficient documentation

## 2019-02-09 DIAGNOSIS — J45909 Unspecified asthma, uncomplicated: Secondary | ICD-10-CM | POA: Insufficient documentation

## 2019-02-09 DIAGNOSIS — E119 Type 2 diabetes mellitus without complications: Secondary | ICD-10-CM | POA: Diagnosis not present

## 2019-02-09 DIAGNOSIS — Z79899 Other long term (current) drug therapy: Secondary | ICD-10-CM | POA: Insufficient documentation

## 2019-02-09 DIAGNOSIS — L089 Local infection of the skin and subcutaneous tissue, unspecified: Secondary | ICD-10-CM | POA: Diagnosis not present

## 2019-02-09 DIAGNOSIS — Z794 Long term (current) use of insulin: Secondary | ICD-10-CM | POA: Insufficient documentation

## 2019-02-09 DIAGNOSIS — E042 Nontoxic multinodular goiter: Secondary | ICD-10-CM | POA: Diagnosis not present

## 2019-02-09 DIAGNOSIS — L0291 Cutaneous abscess, unspecified: Secondary | ICD-10-CM

## 2019-02-09 DIAGNOSIS — E039 Hypothyroidism, unspecified: Secondary | ICD-10-CM | POA: Diagnosis not present

## 2019-02-09 MED ORDER — SULFAMETHOXAZOLE-TRIMETHOPRIM 800-160 MG PO TABS
1.0000 | ORAL_TABLET | Freq: Once | ORAL | Status: AC
  Administered 2019-02-09: 1 via ORAL
  Filled 2019-02-09: qty 1

## 2019-02-09 MED ORDER — SULFAMETHOXAZOLE-TRIMETHOPRIM 800-160 MG PO TABS: 1.0000 | ORAL_TABLET | Freq: Two times a day (BID) | ORAL | 0 refills | Status: AC

## 2019-02-09 NOTE — ED Provider Notes (Signed)
[facility] Emergency Department Provider Note    First MD Initiated Contact with Patient 02/09/19 0217     (approximate)  I have reviewed the triage vital signs and the nursing notes.   HISTORY  Chief Complaint Abscess    HPI Philip [name] is a 58 y.o. male with below list of previous medical conditions presents to the emergency department secondary to tender swollen area upper right chest wall.  Patient states that he had a "bump" in that area that he thought was a pimple for a while however he noted in the last 24 hours at that area has increased in size and discomfort with associated redness.  Patient denies any fever.        Past Medical History:  Diagnosis Date  . Anxiety   . Asthma   . Bipolar 1 disorder (HCC)   . COPD (chronic obstructive pulmonary disease) (HCC)   . Depression   . Diabetes mellitus without complication (HCC)   . GERD (gastroesophageal reflux disease)   . Osteoarthritis   . Thyroid disease     Patient Active Problem List   Diagnosis Date Noted  . Nocturnal hypoxia 09/19/2018  . Hypoventilation associated with obesity syndrome (HCC) 08/11/2018  . Morbid obesity with body mass index (BMI) of 50.0 to 59.9 in adult (HCC) 02/07/2018  . Hypogonadism in male 01/09/2018  . Erectile dysfunction 01/09/2018  . Benign prostatic hyperplasia with lower urinary tract symptoms 01/09/2018  . Chronic respiratory failure with hypercapnia (HCC) 07/09/2017  . Asthma 07/05/2017  . COPD (chronic obstructive pulmonary disease) (HCC) 07/05/2017  . GERD (gastroesophageal reflux disease) 07/05/2017  . Type 2 diabetes mellitus with autonomic neuropathy (HCC) 07/05/2017  . Hypothyroid 07/05/2017  . Osteoarthritis 07/05/2017  . Bipolar 1 disorder (HCC) 07/05/2017    Past Surgical History:  Procedure Laterality Date  . CHOLECYSTECTOMY    . SHOULDER SURGERY Right   . UMBILICAL HERNIA REPAIR      Prior to Admission medications    Medication Sig Start Date End Date Taking? Authorizing Provider  albuterol (VENTOLIN HFA) 108 (90 Base) MCG/ACT inhaler INHALE 1 TO 2 puffs by mouth every 4-6 hours prn for wheezing and shortness of breath 01/02/19   Galen Manila, NP  aspirin EC 81 MG tablet Take 81 mg by mouth daily.    [provider]  atorvastatin (LIPITOR) 10 MG tablet Take 1 tablet (10 mg total) by mouth every Monday, Wednesday, and Friday at 6 PM. Patient taking differently: Take 10 mg by mouth every Monday, Wednesday, and Friday at 6 PM. Currently taking only Monday and Friday 01/03/19   Galen Manila, NP  B-D ULTRAFINE III SHORT PEN 31G X 8 MM MISC Inject 1 Device into the skin 5 (five) times daily. 11/17/17   Galen Manila, NP  budesonide-formoterol [facility]) 160-4.5 MCG/ACT inhaler Inhale 2 puffs into the lungs 2 (two) times daily. 01/02/19   Galen Manila, NP  buPROPion [facility] SR) 200 MG 12 hr tablet Take 1 tablet PO BID 01/02/19   Galen Manila, NP  Cholecalciferol (VITAMIN D PO) Take 1 capsule by mouth daily.    [provider]  citalopram (CELEXA) 40 MG tablet Take 40 mg by mouth daily. 10/18/17   [provider]  collagenase (SANTYL) ointment Apply thin layer to wound 1-2 times daily until healing has begun 02/25/18   Triplett, Rulon Eisenmenger B, FNP  diclofenac (VOLTAREN) 75 MG EC tablet TAKE 1 TABLET(75 MG) BY MOUTH TWICE  DAILY 01/02/19   Galen Manila, NP  diclofenac sodium (VOLTAREN) 1 % GEL Apply 4 g topically 4 (four) times daily. 01/02/19   Galen Manila, NP  fluticasone (FLONASE) 50 MCG/ACT nasal spray Place 2 sprays into both nostrils daily. 01/11/18   Galen Manila, NP  furosemide (LASIX) 40 MG tablet Take 1 tablet (40 mg total) by mouth daily. 01/02/19   Galen Manila, NP  gabapentin (NEURONTIN) 400 MG capsule Take 2 capsules (800 mg total) by mouth 4 (four) times daily. 01/02/19   Galen Manila, NP  Insulin Glargine, 2  Unit Dial, (TOUJEO MAX SOLOSTAR) 300 UNIT/ML SOPN Inject 70 Units into the skin 2 (two) times daily. 01/02/19   Galen Manila, NP  insulin lispro (HUMALOG KWIKPEN) 100 UNIT/ML KwikPen INJECT 20 UNITS UNDER THE SKIN TID BEFORE MEALS plus Sliding Scale correction up to 15 additional units per dose. 01/08/19   Galen Manila, NP  levocetirizine (XYZAL) 5 MG tablet TAKE 1 TABLET(5 MG) BY MOUTH EVERY EVENING 01/02/19   Galen Manila, NP  levothyroxine (SYNTHROID, LEVOTHROID) 200 MCG tablet Take 1 tablet (200 mcg total) by mouth daily before breakfast. 01/02/19   Galen Manila, NP  levothyroxine (SYNTHROID, LEVOTHROID) 25 MCG tablet TAKE 1 TABLET(25 MCG) BY MOUTH DAILY BEFORE BREAKFAST with 200 mcg tablet for 225 mcg total dose. 01/02/19   Galen Manila, NP  lisinopril (PRINIVIL,ZESTRIL) 40 MG tablet Take 1 tablet (40 mg total) by mouth daily. 01/02/19   Galen Manila, NP  metFORMIN (GLUCOPHAGE-XR) 500 MG 24 hr tablet TAKE 2 TABLETS(1000 MG) BY MOUTH TWICE DAILY WITH A MEAL 01/02/19   Galen Manila, NP  Multiple Vitamin (MULTIVITAMIN WITH MINERALS) TABS tablet Take 1 tablet by mouth daily.    [provider]  multivitamin-lutein (OCUVITE-LUTEIN) CAPS capsule Take 1 capsule by mouth daily.    [provider]  NEEDLE, DISP, 18 G 18G X 1-1/2" MISC 1 Container by Does not apply route once a week. 04/12/18   McGowan, Carollee Herter A, PA-C  NEEDLE, DISP, 21 G 21G X 1-1/2" MISC 1 Container by Does not apply route once a week. 04/10/18   Michiel Cowboy A, PA-C  NEEDLE, DISP, 21 G 21G X 1-1/2" MISC For testosterone injections as directed 04/12/18   Michiel Cowboy A, PA-C  Nutritional Supplements (GLUCERNA 1.0 CAL/CARBSTEADY) LIQD Take 1 Bottle by mouth daily as needed. Patient not taking: Reported on 01/24/2019 04/20/18   Galen Manila, NP  omeprazole (PRILOSEC) 40 MG capsule TAKE 1 CAPSULE(40 MG) BY MOUTH TWICE DAILY 01/02/19   Galen Manila, NP   potassium chloride SA (K-DUR,KLOR-CON) 20 MEQ tablet Take 1 tablet (20 mEq total) by mouth daily. 01/02/19   Galen Manila, NP  sildenafil (REVATIO) 20 MG tablet 2-5 tabs 1 hour prior to intercourse Patient not taking: Reported on 01/24/2019 09/14/18   Riki Altes, MD  Syringe, Disposable, (2-3CC SYRINGE) 3 ML MISC 1 Container by Does not apply route once a week. 04/12/18   McGowan, Carollee Herter A, PA-C  testosterone cypionate (DEPOTESTOSTERONE CYPIONATE) 200 MG/ML injection INJECT 0.7 MLS INTO THE MUSCLE ONCE A WEEK 11/03/18   Stoioff, Verna Czech, MD  tiZANidine (ZANAFLEX) 4 MG tablet TAKE 1 TABLET(4 MG) BY MOUTH AT BEDTIME 01/02/19   Galen Manila, NP  VICTOZA 18 MG/3ML SOPN ADMINISTER 1.8 MG UNDER THE SKIN DAILY 01/02/19   Galen Manila, NP  ziprasidone (GEODON) 20 MG capsule Take 1 capsule (20 mg total)  by mouth 2 (two) times daily with a meal. 01/18/18   Galen Manila, NP    Allergies Aripiprazole; Duloxetine hcl; Fenofibrate; Lamotrigine; Neosporin [neomycin-bacitracin zn-polymyx]; Pregabalin; Simvastatin; Latex; and Valproic acid  Family History  Problem Relation Age of Onset  . Depression Mother   . Diabetes Mother   . Heart disease Mother   . Depression Father   . Lung cancer Father   . Diabetes Father   . Breast cancer Sister   . Anxiety disorder Sister   . Depression Sister   . Stroke Neg Hx     Social History Social History   Tobacco Use  . Smoking status: Never Smoker  . Smokeless tobacco: Never Used  Substance Use Topics  . Alcohol use: Yes    Comment: rarely, once a year  . Drug use: No    Review of Systems Constitutional: No fever/chills Eyes: No visual changes. ENT: No sore throat. Cardiovascular: Denies chest pain. Respiratory: Denies shortness of breath. Gastrointestinal: No abdominal pain.  No nausea, no vomiting.  No diarrhea.  No constipation. Genitourinary: Negative for dysuria. Musculoskeletal: Negative for neck pain.   Negative for back pain. Integumentary: Positive for right upper chest wall lesion Neurological: Negative for headaches, focal weakness or numbness.   ____________________________________________   PHYSICAL EXAM:  VITAL SIGNS: ED Triage Vitals  Enc Vitals Group     BP 02/09/19 0116 (!) 158/83     Pulse Rate 02/09/19 0116 81     Resp 02/09/19 0116 18     Temp 02/09/19 0116 98.1 F (36.7 C)     Temp Source 02/09/19 0116 Oral     SpO2 02/09/19 0116 100 %     Weight 02/09/19 0114 (!) 191 kg (421 lb)     Height 02/09/19 0114 1.905 m ( )     Head Circumference --      Peak Flow --      Pain Score 02/09/19 0114 9     Pain Loc --      Pain Edu? --      Excl. in GC? --     Constitutional: Alert and oriented. Well appearing and in no acute distress. Eyes: Conjunctivae are normal. . Mouth/Throat: Mucous membranes are moist. Neck: No stridor.   Cardiovascular: Normal rate, regular rhythm. Good peripheral circulation. Grossly normal heart sounds. Respiratory: Normal respiratory effort.  No retractions. No audible wheezing. Gastrointestinal: Soft and nontender. No distention.  Neurologic:  Normal speech and language. No gross focal neurologic deficits are appreciated.  Skin: 5 x 5 well-circumscribed area of induration inferior to the right medial clavicle with overlying blanching erythema.  No flocculence noted Psychiatric: Mood and affect are normal. Speech and behavior are normal.   Procedures   ____________________________________________   INITIAL IMPRESSION / MDM / ASSESSMENT AND PLAN / ED COURSE  As part of my medical decision making, I reviewed the following data within the electronic MEDICAL RECORD NUMBER   58 year old male presenting with above-stated history and physical exam secondary to right upper chest wall lesion concerning for possible infected sebaceous cyst versus possible abscess.  As such ultrasound of the soft tissue performed.  Which are concerning for  sebaceous cyst versus an abscess.  Given clinical findings suspect sebaceous cyst to be most likely and as such patient given Bactrim DS in the emergency department will be prescribed the same for home.    ____________________________________________  FINAL CLINICAL IMPRESSION(S) / ED DIAGNOSES  Final diagnoses:  Abscess  Infected sebaceous cyst  Cellulitis of chest wall     MEDICATIONS GIVEN DURING THIS VISIT:  Medications  sulfamethoxazole-trimethoprim (BACTRIM DS) 800-160 MG per tablet 1 tablet (1 tablet Oral Given 02/09/19 0408)     ED Discharge Orders    None       Note:  This document was prepared using Dragon voice recognition software and may include unintentional dictation errors.   Darci CurrentBrown, [place] N, MD 02/09/19 [phone]

## 2019-02-09 NOTE — ED Notes (Signed)
PT states pain only when pressing on mass

## 2019-02-09 NOTE — ED Notes (Signed)
Patient transported to Ultrasound 

## 2019-02-09 NOTE — ED Triage Notes (Signed)
Patient ambulatory to triage with steady gait, without difficulty or distress noted; pt reports abscess to right side neck "for awhile"; st has become larger; denies hx of same

## 2019-02-15 ENCOUNTER — Telehealth: Payer: Self-pay | Admitting: Dietician

## 2019-02-15 NOTE — Telephone Encounter (Signed)
Attempted to reach patient to confirm that he does need 6 monthly supervised weight loss visits prior to being approved for weight loss surgery. Received a busy signal two times.

## 2019-02-22 ENCOUNTER — Telehealth: Payer: Self-pay

## 2019-02-22 NOTE — Telephone Encounter (Signed)
Called patient for COVID-19 pre-screening for in office visit.  Have you recently traveled any where out of the local area in the last 2 weeks? no  Have you been in close contact with a person diagnosed with COVID-19 within the last 2 weeks? no  Do you currently have any of the following symptoms? If so, when did they start? Cough     Diarrhea   Joint Pain Fever      Muscle Pain   Red eyes Shortness of breath   Abdominal pain  Vomiting Loss of smell    Rash    Sore Throat Headache    Weakness   Bruising or bleeding  Joint/muscle pain. Pt stated that this is normal for him. He has this pain everyday.  Okay to proceed with visit 02/23/2019.

## 2019-02-23 ENCOUNTER — Ambulatory Visit: Payer: Medicare Other

## 2019-02-23 ENCOUNTER — Other Ambulatory Visit: Payer: Self-pay

## 2019-03-01 ENCOUNTER — Encounter: Payer: Medicare Other | Attending: Surgery | Admitting: Dietician

## 2019-03-01 ENCOUNTER — Other Ambulatory Visit: Payer: Self-pay

## 2019-03-01 ENCOUNTER — Encounter: Payer: Self-pay | Admitting: Dietician

## 2019-03-01 VITALS — Ht 75.0 in | Wt >= 6400 oz

## 2019-03-01 DIAGNOSIS — Z6841 Body Mass Index (BMI) 40.0 and over, adult: Secondary | ICD-10-CM | POA: Insufficient documentation

## 2019-03-01 DIAGNOSIS — E119 Type 2 diabetes mellitus without complications: Secondary | ICD-10-CM | POA: Insufficient documentation

## 2019-03-01 DIAGNOSIS — E1165 Type 2 diabetes mellitus with hyperglycemia: Secondary | ICD-10-CM

## 2019-03-01 DIAGNOSIS — Z713 Dietary counseling and surveillance: Secondary | ICD-10-CM | POA: Insufficient documentation

## 2019-03-01 NOTE — Progress Notes (Signed)
Appt start time: 1100 end time:  1130.  Assessment:   #3 SWL Appointment.   Start Wt at NDES: 419.3lbs Wt: 413.5lbs Ht: 6'3" BMI: 51.68  Preferred Learning Style:   Auditory   Learning Readiness:   Change in progress  MEDICATIONS: albuterol inhaler, aspirin, atorvastatin, budesonide-formoterol inhaler, buPROPion, citalopram, fluticasone nasal spray, furosemide, gabapentin, Insulin Glargine 300U/ml, insulin lispro, levocetirizine, levothyroxine, lisinopril, melatonin, metFORMIN, multivitamin, omeprazole, potassium chloride, sildenafil, tamsulosin, testosterone injection, tiotropium bromide monohydrate inhaler, tiZANidine, VICTOZA, Vitamin D, ziprasidone  Progress:  Patient is checking his weight regularly at home, and reports his weight is fluctuating between 412 and 415lbs due to occasional fluid retention.   Patient has begun partial liquid diet; he feels he needs to follow a strictly disciplined eating pattern to eliminate food cravings and eating when not hungry.  Patient is experiencing increased stress, as he is having to move residences suddenly.   DIETARY INTAKE: 24-hr recall:  Breakfast: Ensure Max protein drink Snack: none  Lunch: Ensure Max protein drink Snack: none Dinner: Photographer loaf and mashed potato meal; other frozen meals Snack: [place] or Geophysical data processor with cheese roll-up 1-2 Beverages: water, decaf tea with Splenda  Usual physical activity: walking 60 minutes, 5x a week  Diet to Follow: 15g carbohydrates with meals Continued emphasis on lean proteins and low-carb vegetables               Nutritional Diagnosis:  [place]-3.3 Overweight/obesity related to history of excess calories and physical inactivity as evidenced by patient current BMI of 51.68 following dietary guidelines for continued weight loss prior to bariatric surgery.              Intervention:   . Nutrition counseling for weight loss prior to bariatric surgery. . Discussed  importance of adequate nutrition and avoiding "burnout" on pre-op diet, as patient's surgery is several months away. Offered options such as liquid meals a few days a week, or once a day.  . Encouraged inclusion of small amounts of healthy carb foods to help prevent hypoglycemia. Instructed on appropriate hypoglycemia treatment. . Discussed effects of gastric bypass surgery on hunger and appetite.   Teaching Method Utilized:  Auditory   Handouts given during visit include:  Goals and instructions (to be sent to patient due to printing issues  Barriers to learning/adherence to lifestyle change: none  Demonstrated degree of understanding via:  Teach Back   Monitoring/Evaluation:  Dietary intake, exercise, BG control, and body weight 04/03/19.

## 2019-03-01 NOTE — Patient Instructions (Signed)
   Great job avoiding sweets and other unhealthy foods!  Try 1 protein shake daily with 2 small meals and 1 small healthy snack. This is the same as the pre-op diet 2 weeks before surgery.   Include 1 serving (15grams) of a carb food with meals to help prevent low blood sugars. Ideally whole grain bread, 1/3 - 1/2 cup brown rice or whole grain pasta, 1/2 cup beans or peas, or 1 fruit.   If you have symptoms of low blood sugar (weak, shaky, light-headed), drink 4oz of juice or regular (not diet) soda. Allow 10-15 minutes to feel better and retest sugar if able. If it is improved, follow with a meal or snack within 30 minutes. If still low, repeat the 4oz of sugar-sweetened drink.   If you plan to continue with your current eating pattern, you will need to begin taking a multivitamin to prevent nutrient deficiencies.

## 2019-03-06 ENCOUNTER — Ambulatory Visit: Payer: Medicare HMO

## 2019-03-09 DIAGNOSIS — J45909 Unspecified asthma, uncomplicated: Secondary | ICD-10-CM | POA: Diagnosis not present

## 2019-03-10 ENCOUNTER — Other Ambulatory Visit: Payer: Self-pay | Admitting: Nurse Practitioner

## 2019-03-10 DIAGNOSIS — E119 Type 2 diabetes mellitus without complications: Secondary | ICD-10-CM

## 2019-03-12 ENCOUNTER — Other Ambulatory Visit: Payer: Self-pay | Admitting: Nurse Practitioner

## 2019-03-12 DIAGNOSIS — M159 Polyosteoarthritis, unspecified: Secondary | ICD-10-CM

## 2019-03-12 DIAGNOSIS — F319 Bipolar disorder, unspecified: Secondary | ICD-10-CM

## 2019-03-12 NOTE — Telephone Encounter (Signed)
Pt called requesting refill on Arthritis medication called into  [facility]

## 2019-03-13 MED ORDER — CITALOPRAM HYDROBROMIDE 40 MG PO TABS: 40.0000 mg | ORAL_TABLET | Freq: Every day | ORAL | 1 refills | Status: DC

## 2019-03-13 NOTE — Telephone Encounter (Signed)
The pt called requesting a refill on his diclofenac 75 MG tablets for bilateral leg pain that he contributes to arthritis. He proceeded to state that the diclofenac was not controlling the pain and requested something else. I informed the patient that he would need an appt to change his medication. I scheduled him for this Friday, June 5th.

## 2019-03-15 ENCOUNTER — Telehealth: Payer: Self-pay | Admitting: Nurse Practitioner

## 2019-03-15 NOTE — Telephone Encounter (Signed)
PT stated that he is moving to [place], [place] and will not be able to do AWV at this time, will keep [facility] as provider until he can establish care with new provider in [place]. knb

## 2019-03-16 ENCOUNTER — Other Ambulatory Visit: Payer: Self-pay

## 2019-03-16 ENCOUNTER — Encounter: Payer: Self-pay | Admitting: Family Medicine

## 2019-03-16 ENCOUNTER — Ambulatory Visit (INDEPENDENT_AMBULATORY_CARE_PROVIDER_SITE_OTHER): Payer: Medicare Other | Admitting: Family Medicine

## 2019-03-16 ENCOUNTER — Other Ambulatory Visit: Payer: Self-pay | Admitting: Family Medicine

## 2019-03-16 DIAGNOSIS — G8929 Other chronic pain: Secondary | ICD-10-CM | POA: Diagnosis not present

## 2019-03-16 DIAGNOSIS — M5442 Lumbago with sciatica, left side: Secondary | ICD-10-CM | POA: Diagnosis not present

## 2019-03-16 DIAGNOSIS — M159 Polyosteoarthritis, unspecified: Secondary | ICD-10-CM

## 2019-03-16 DIAGNOSIS — M150 Primary generalized (osteo)arthritis: Secondary | ICD-10-CM | POA: Diagnosis not present

## 2019-03-16 MED ORDER — TIZANIDINE HCL 4 MG PO TABS: ORAL_TABLET | ORAL | 0 refills | Status: DC

## 2019-03-16 MED ORDER — DICLOFENAC SODIUM 75 MG PO TBEC: 75.0000 mg | DELAYED_RELEASE_TABLET | Freq: Two times a day (BID) | ORAL | 0 refills | Status: DC

## 2019-03-16 NOTE — Progress Notes (Signed)
Virtual Visit via Telephone The purpose of this virtual visit is to provide medical care while limiting exposure to the novel coronavirus (COVID19) for both patient and office staff.  Consent was obtained for phone visit:  Yes.   Answered questions that patient had about telehealth interaction:  Yes.   I discussed the limitations, risks, security and privacy concerns of performing an evaluation and management service by telephone. I also discussed with the patient that there may be a patient responsible charge related to this service. The patient expressed understanding and agreed to proceed.  Patient Location: Home Provider Location: [facility] (Office)  PCP is [name], AGPCNP-BC - I am currently covering during her maternity leave.   ---------------------------------------------------------------------- Chief Complaint  Patient presents with  . Leg Pain    bilateral leg pain that the patient contributes to his weight.    S: Reviewed CMA documentation. I have called patient and gathered additional HPI as follows:  Osteoarthritis multiple joints / Chronic Pain Syndrome / Neuropathy Chronic history of muscle and joint pain. Followed by PCP for multiple medications managed, seem to control his symptoms most of the time, if out of meds he has worsening pain, now out of Diclofenac and Tizanidine for 5 days, he is waiting on mail order shipment, soon he is moving to [place] and will change PCP soon but needs temporary rx for now - Taking Diclofenac  BID, Diclofenac topical gel PRN good results - history of osteoarthritis multiple joints, neck shoulders, low back - History of neuropathy, Gabapentin  x 2 =  up to 4 times daily - Also history of pursuing gastric bypass surgery Denies any new injury, trauma, fall, swelling redness, fever chills, weakness numbness tingling  Past Medical History:  Diagnosis Date  . Anxiety   . Asthma   . Bipolar 1  disorder (HCC)   . COPD (chronic obstructive pulmonary disease) (HCC)   . Depression   . Diabetes mellitus without complication (HCC)   . GERD (gastroesophageal reflux disease)   . Osteoarthritis   . Thyroid disease    Social History   Tobacco Use  . Smoking status: Never Smoker  . Smokeless tobacco: Never Used  Substance Use Topics  . Alcohol use: Yes    Comment: rarely, once a year  . Drug use: No    Current Outpatient Medications:  .  albuterol (VENTOLIN HFA) 108 (90 Base) MCG/ACT inhaler, INHALE 1 TO 2 puffs by mouth every 4-6 hours prn for wheezing and shortness of breath, Disp: 18 g, Rfl: 1 .  aspirin EC 81 MG tablet, Take 81 mg by mouth daily., Disp: , Rfl:  .  atorvastatin (LIPITOR) 10 MG tablet, Take 1 tablet (10 mg total) by mouth every Monday, Wednesday, and Friday at 6 PM. (Patient taking differently: Take 10 mg by mouth every Monday, Wednesday, and Friday at 6 PM. Currently taking only Monday and Friday), Disp: 42 tablet, Rfl: 1 .  B-D ULTRAFINE III SHORT PEN 31G X 8 MM MISC, Inject 1 Device into the skin 5 (five) times daily., Disp: 450 each, Rfl: 3 .  budesonide-formoterol (SYMBICORT) 160-4.5 MCG/ACT inhaler, Inhale 2 puffs into the lungs 2 (two) times daily., Disp: 3 Inhaler, Rfl: 1 .  buPROPion (WELLBUTRIN SR) 200 MG 12 hr tablet, Take 1 tablet PO BID, Disp: 180 tablet, Rfl: 1 .  Cholecalciferol (VITAMIN D PO), Take 1 capsule by mouth daily., Disp: , Rfl:  .  citalopram (CELEXA) 40 MG tablet, Take 1 tablet (  40 mg total) by mouth daily., Disp: 30 tablet, Rfl: 1 .  collagenase (SANTYL) ointment, Apply thin layer to wound 1-2 times daily until healing has begun, Disp: 15 g, Rfl: 1 .  diclofenac (VOLTAREN) 75 MG EC tablet, TAKE 1 TABLET(75 MG) BY MOUTH TWICE DAILY, Disp: 180 tablet, Rfl: 1 .  diclofenac (VOLTAREN) 75 MG EC tablet, Take 1 tablet (75 mg total) by mouth 2 (two) times daily., Disp: 60 tablet, Rfl: 0 .  diclofenac sodium (VOLTAREN) 1 % GEL, Apply 4 g topically  4 (four) times daily., Disp: 3 Tube, Rfl: 1 .  fluticasone (FLONASE) 50 MCG/ACT nasal spray, Place 2 sprays into both nostrils daily., Disp: 16 g, Rfl: 6 .  furosemide (LASIX) 40 MG tablet, Take 1 tablet (40 mg total) by mouth daily., Disp: 90 tablet, Rfl: 1 .  gabapentin (NEURONTIN) 400 MG capsule, Take 2 capsules (800 mg total) by mouth 4 (four) times daily., Disp: 720 capsule, Rfl: 1 .  Insulin Glargine, 2 Unit Dial, (TOUJEO MAX SOLOSTAR) 300 UNIT/ML SOPN, Inject 70 Units into the skin 2 (two) times daily., Disp: 14 pen, Rfl: 1 .  insulin lispro (HUMALOG KWIKPEN) 100 UNIT/ML KwikPen, INJECT 20 UNITS UNDER THE SKIN TID BEFORE MEALS plus Sliding Scale correction up to 15 additional units per dose., Disp: 45 mL, Rfl: 4 .  levocetirizine (XYZAL) 5 MG tablet, TAKE 1 TABLET(5 MG) BY MOUTH EVERY EVENING, Disp: 90 tablet, Rfl: 1 .  levothyroxine (SYNTHROID, LEVOTHROID) 200 MCG tablet, Take 1 tablet (200 mcg total) by mouth daily before breakfast., Disp: 90 tablet, Rfl: 1 .  levothyroxine (SYNTHROID, LEVOTHROID) 25 MCG tablet, TAKE 1 TABLET(25 MCG) BY MOUTH DAILY BEFORE BREAKFAST with 200 mcg tablet for 225 mcg total dose., Disp: 90 tablet, Rfl: 1 .  lisinopril (PRINIVIL,ZESTRIL) 40 MG tablet, Take 1 tablet (40 mg total) by mouth daily., Disp: 90 tablet, Rfl: 1 .  metFORMIN (GLUCOPHAGE-XR) 500 MG 24 hr tablet, TAKE 2 TABLETS(1000 MG) BY MOUTH TWICE DAILY WITH A MEAL, Disp: 360 tablet, Rfl: 1 .  Multiple Vitamin (MULTIVITAMIN WITH MINERALS) TABS tablet, Take 1 tablet by mouth daily., Disp: , Rfl:  .  multivitamin-lutein (OCUVITE-LUTEIN) CAPS capsule, Take 1 capsule by mouth daily., Disp: , Rfl:  .  NEEDLE, DISP, 18 G 18G X 1-1/2" MISC, 1 Container by Does not apply route once a week., Disp: 1 each, Rfl: 0 .  NEEDLE, DISP, 21 G 21G X 1-1/2" MISC, 1 Container by Does not apply route once a week., Disp: 1 each, Rfl: 0 .  NEEDLE, DISP, 21 G 21G X 1-1/2" MISC, For testosterone injections as directed, Disp: 12  each, Rfl: 2 .  Nutritional Supplements (GLUCERNA 1.0 CAL/CARBSTEADY) LIQD, Take 1 Bottle by mouth daily as needed. (Patient not taking: Reported on 01/24/2019), Disp: 7110 mL, Rfl: 1 .  omeprazole (PRILOSEC) 40 MG capsule, TAKE 1 CAPSULE(40 MG) BY MOUTH TWICE DAILY, Disp: 180 capsule, Rfl: 1 .  potassium chloride SA (K-DUR,KLOR-CON) 20 MEQ tablet, Take 1 tablet (20 mEq total) by mouth daily., Disp: 90 tablet, Rfl: 1 .  sildenafil (REVATIO) 20 MG tablet, 2-5 tabs 1 hour prior to intercourse (Patient not taking: Reported on 01/24/2019), Disp: 30 tablet, Rfl: 3 .  Syringe, Disposable, (2-3CC SYRINGE) 3 ML MISC, 1 Container by Does not apply route once a week., Disp: 1 each, Rfl: 0 .  testosterone cypionate (DEPOTESTOSTERONE CYPIONATE) 200 MG/ML injection, INJECT 0.7 MLS INTO THE MUSCLE ONCE A WEEK, Disp: 4 mL, Rfl: 2 .  tiZANidine (  ZANAFLEX) 4 MG tablet, TAKE 1 TABLET(4 MG) BY MOUTH AT BEDTIME, Disp: 30 tablet, Rfl: 0 .  VICTOZA 18 MG/3ML SOPN, INJECT 1.8MG  SUBCUTANEOUSLY DAILY, Disp: 18 mL, Rfl: 1 .  ziprasidone (GEODON) 20 MG capsule, Take 1 capsule (20 mg total) by mouth 2 (two) times daily with a meal., Disp: 60 capsule, Rfl: 1  Depression screen [facility] 2/9 01/24/2019 12/08/2018 10/05/2018  Decreased Interest 3 0 0  Down, Depressed, Hopeless 0 0 0  PHQ - 2 Score 3 0 0  Altered sleeping 0 - -  Tired, decreased energy 2 - -  Change in appetite 3 - -  Feeling bad or failure about yourself  0 - -  Trouble concentrating 0 - -  Moving slowly or fidgety/restless 0 - -  Suicidal thoughts 0 - -  PHQ-9 Score 8 - -  Difficult doing work/chores Somewhat difficult - -    GAD 7 : Generalized Anxiety Score 01/24/2019 09/09/2017 07/05/2017  Nervous, Anxious, on Edge 0 1 3  Control/stop worrying Worry too much - different things 0 1 3  Trouble relaxing 0 0 2  Restless 0 0 2  Easily annoyed or irritable Afraid - awful might happen Total GAD 7 Score Anxiety Difficulty Somewhat  difficult Not difficult at all Not difficult at all    -------------------------------------------------------------------------- O: No physical exam performed due to remote telephone encounter.  Lab results reviewed.  No results found for this or any previous visit (from the past 2160 hour(s)).  -------------------------------------------------------------------------- A&P:  Problem List Items Addressed This Visit    Osteoarthritis - Primary   Relevant Medications   diclofenac (VOLTAREN) 75 MG EC tablet   tiZANidine (ZANAFLEX) 4 MG tablet    Other Visit Diagnoses    Chronic bilateral low back pain with left-sided sciatica       Relevant Medications   diclofenac (VOLTAREN) 75 MG EC tablet   tiZANidine (ZANAFLEX) 4 MG tablet     Clinically with multiple joint pain OA/DJD, neuropathy, pain syndrome Mostly controlled pain on current regimen Not followed by pain management or other specialty  Plan - Given out of Diclofenac and Tizanidine for 5 days and increasing pain, agree to temporary 30 day rx sent to local [facility] in [place] for both meds, while waiting on mail order to arrive next week - Continue all other medications, including gabapentin, topical diclofenac - Follow up as needed if not improving, future recommend pain management - note he is moving and will relocate to new PCP   Meds ordered this encounter  Medications  . diclofenac (VOLTAREN) 75 MG EC tablet    Sig: Take 1 tablet (75 mg total) by mouth 2 (two) times daily.    Dispense:  60 tablet    Refill:  0    1 month rx while waiting on mail order  . tiZANidine (ZANAFLEX) 4 MG tablet    Sig: TAKE 1 TABLET(4 MG) BY MOUTH AT BEDTIME    Dispense:  30 tablet    Refill:  0    Follow-up: - Return as needed 4-6 weeks for joint pain  Patient verbalizes understanding with the above medical recommendations including the limitation of remote medical advice.  Specific follow-up and call-back criteria were given for  patient to follow-up or seek medical care more urgently if needed.  - Time spent in direct consultation with patient on phone: 9 minutes   Saralyn Pilar,  DO [facility] 03/16/2019, 1:30 PM

## 2019-03-16 NOTE — Patient Instructions (Addendum)
AVS info given by phone. No mychart access. 

## 2019-03-21 ENCOUNTER — Other Ambulatory Visit: Payer: Self-pay

## 2019-03-21 DIAGNOSIS — F319 Bipolar disorder, unspecified: Secondary | ICD-10-CM

## 2019-03-21 MED ORDER — BUPROPION HCL ER (SR) 200 MG PO TB12: 200.0000 mg | ORAL_TABLET | Freq: Two times a day (BID) | ORAL | 0 refills | Status: DC

## 2019-03-27 ENCOUNTER — Other Ambulatory Visit: Payer: Self-pay | Admitting: Nurse Practitioner

## 2019-03-27 DIAGNOSIS — G8911 Acute pain due to trauma: Secondary | ICD-10-CM | POA: Diagnosis not present

## 2019-03-27 DIAGNOSIS — M545 Low back pain: Secondary | ICD-10-CM | POA: Diagnosis not present

## 2019-03-27 DIAGNOSIS — L0390 Cellulitis, unspecified: Secondary | ICD-10-CM | POA: Diagnosis not present

## 2019-03-27 DIAGNOSIS — R0602 Shortness of breath: Secondary | ICD-10-CM | POA: Diagnosis not present

## 2019-03-27 DIAGNOSIS — L03116 Cellulitis of left lower limb: Secondary | ICD-10-CM | POA: Diagnosis not present

## 2019-03-27 DIAGNOSIS — L03115 Cellulitis of right lower limb: Secondary | ICD-10-CM | POA: Diagnosis not present

## 2019-03-27 DIAGNOSIS — M6281 Muscle weakness (generalized): Secondary | ICD-10-CM | POA: Diagnosis not present

## 2019-03-27 DIAGNOSIS — R109 Unspecified abdominal pain: Secondary | ICD-10-CM | POA: Diagnosis not present

## 2019-03-27 DIAGNOSIS — E119 Type 2 diabetes mellitus without complications: Secondary | ICD-10-CM | POA: Diagnosis not present

## 2019-03-27 DIAGNOSIS — B372 Candidiasis of skin and nail: Secondary | ICD-10-CM | POA: Diagnosis not present

## 2019-03-27 DIAGNOSIS — M47816 Spondylosis without myelopathy or radiculopathy, lumbar region: Secondary | ICD-10-CM | POA: Diagnosis not present

## 2019-03-27 DIAGNOSIS — E11649 Type 2 diabetes mellitus with hypoglycemia without coma: Secondary | ICD-10-CM | POA: Diagnosis not present

## 2019-03-27 DIAGNOSIS — S32029A Unspecified fracture of second lumbar vertebra, initial encounter for closed fracture: Secondary | ICD-10-CM | POA: Diagnosis not present

## 2019-03-27 DIAGNOSIS — R350 Frequency of micturition: Secondary | ICD-10-CM | POA: Diagnosis not present

## 2019-03-27 DIAGNOSIS — I10 Essential (primary) hypertension: Secondary | ICD-10-CM | POA: Diagnosis not present

## 2019-03-27 DIAGNOSIS — M549 Dorsalgia, unspecified: Secondary | ICD-10-CM | POA: Diagnosis not present

## 2019-03-27 DIAGNOSIS — E785 Hyperlipidemia, unspecified: Secondary | ICD-10-CM | POA: Diagnosis not present

## 2019-03-27 DIAGNOSIS — E039 Hypothyroidism, unspecified: Secondary | ICD-10-CM | POA: Diagnosis not present

## 2019-03-27 NOTE — Telephone Encounter (Signed)
Pt called requesting refill on needles called into [place]  # [phone]

## 2019-03-28 DIAGNOSIS — R350 Frequency of micturition: Secondary | ICD-10-CM | POA: Diagnosis not present

## 2019-03-28 DIAGNOSIS — M549 Dorsalgia, unspecified: Secondary | ICD-10-CM | POA: Diagnosis not present

## 2019-03-28 DIAGNOSIS — M47816 Spondylosis without myelopathy or radiculopathy, lumbar region: Secondary | ICD-10-CM | POA: Diagnosis not present

## 2019-03-28 DIAGNOSIS — E119 Type 2 diabetes mellitus without complications: Secondary | ICD-10-CM | POA: Diagnosis not present

## 2019-03-28 DIAGNOSIS — R0602 Shortness of breath: Secondary | ICD-10-CM | POA: Diagnosis not present

## 2019-03-28 DIAGNOSIS — Z9181 History of falling: Secondary | ICD-10-CM | POA: Diagnosis not present

## 2019-03-28 DIAGNOSIS — L0390 Cellulitis, unspecified: Secondary | ICD-10-CM | POA: Diagnosis not present

## 2019-03-28 MED ORDER — BD PEN NEEDLE SHORT U/F 31G X 8 MM MISC: 1.0000 | Freq: Every day | 0 refills | Status: DC

## 2019-03-29 DIAGNOSIS — E119 Type 2 diabetes mellitus without complications: Secondary | ICD-10-CM | POA: Diagnosis not present

## 2019-03-29 DIAGNOSIS — R350 Frequency of micturition: Secondary | ICD-10-CM | POA: Diagnosis not present

## 2019-03-29 DIAGNOSIS — Z9181 History of falling: Secondary | ICD-10-CM | POA: Diagnosis not present

## 2019-03-29 DIAGNOSIS — R600 Localized edema: Secondary | ICD-10-CM | POA: Diagnosis not present

## 2019-03-29 DIAGNOSIS — L0390 Cellulitis, unspecified: Secondary | ICD-10-CM | POA: Diagnosis not present

## 2019-03-30 DIAGNOSIS — R350 Frequency of micturition: Secondary | ICD-10-CM | POA: Diagnosis not present

## 2019-03-30 DIAGNOSIS — E119 Type 2 diabetes mellitus without complications: Secondary | ICD-10-CM | POA: Diagnosis not present

## 2019-03-30 DIAGNOSIS — R5381 Other malaise: Secondary | ICD-10-CM | POA: Diagnosis not present

## 2019-03-30 DIAGNOSIS — Z9181 History of falling: Secondary | ICD-10-CM | POA: Diagnosis not present

## 2019-03-30 DIAGNOSIS — L0390 Cellulitis, unspecified: Secondary | ICD-10-CM | POA: Diagnosis not present

## 2019-04-03 ENCOUNTER — Telehealth: Payer: Self-pay | Admitting: Dietician

## 2019-04-03 ENCOUNTER — Other Ambulatory Visit: Payer: Self-pay

## 2019-04-03 ENCOUNTER — Encounter: Payer: Medicare Other | Attending: Surgery | Admitting: Dietician

## 2019-04-03 DIAGNOSIS — E119 Type 2 diabetes mellitus without complications: Secondary | ICD-10-CM | POA: Insufficient documentation

## 2019-04-03 DIAGNOSIS — Z713 Dietary counseling and surveillance: Secondary | ICD-10-CM | POA: Insufficient documentation

## 2019-04-03 DIAGNOSIS — Z6841 Body Mass Index (BMI) 40.0 and over, adult: Secondary | ICD-10-CM | POA: Insufficient documentation

## 2019-04-03 NOTE — Telephone Encounter (Signed)
Called patient to begin telehealth visit for monthly supervised weight loss. Reveived voicemail and left a message for patient to return the call.

## 2019-04-05 DIAGNOSIS — I10 Essential (primary) hypertension: Secondary | ICD-10-CM | POA: Diagnosis not present

## 2019-04-05 DIAGNOSIS — E0841 Diabetes mellitus due to underlying condition with diabetic mononeuropathy: Secondary | ICD-10-CM | POA: Diagnosis not present

## 2019-04-05 DIAGNOSIS — M1990 Unspecified osteoarthritis, unspecified site: Secondary | ICD-10-CM | POA: Diagnosis not present

## 2019-04-05 DIAGNOSIS — E1122 Type 2 diabetes mellitus with diabetic chronic kidney disease: Secondary | ICD-10-CM | POA: Diagnosis not present

## 2019-04-09 DIAGNOSIS — J45909 Unspecified asthma, uncomplicated: Secondary | ICD-10-CM | POA: Diagnosis not present

## 2019-04-10 ENCOUNTER — Encounter: Payer: Medicare Other | Admitting: Dietician

## 2019-04-10 ENCOUNTER — Other Ambulatory Visit: Payer: Self-pay

## 2019-04-10 DIAGNOSIS — Z794 Long term (current) use of insulin: Secondary | ICD-10-CM

## 2019-04-10 DIAGNOSIS — Z6841 Body Mass Index (BMI) 40.0 and over, adult: Secondary | ICD-10-CM

## 2019-04-10 DIAGNOSIS — E1165 Type 2 diabetes mellitus with hyperglycemia: Secondary | ICD-10-CM

## 2019-04-10 NOTE — Patient Instructions (Signed)
   Make sure to take multivitamin daily to avoid any nutrient deficiencies prior to surgery.   Speak with doctor about having to drink sweet tea to prevent low blood sugars.   Try including a small amount of carb food such as a piece of bread, a serving of fruit, or 5-10 crackers with the protein shakes, to help prevent low blood sugars.

## 2019-04-10 NOTE — Progress Notes (Signed)
Appt start time: 1350 end time:  1420.  Assessment:   #4 SWL Appointment.   Start Wt at NDES: 419.3lbs Wt: 415lbs Ht: 6'3" BMI: 51.8  Preferred Learning Style:   Auditory   Learning Readiness:   Change in progress  MEDICATIONS: albuterol inhaler, aspirin, budesonide-formoterol inhaler, buPROPion, citalopram, fluticasone nasal spray, furosemide, gabapentin, Insulin Glargine 300U/ml, insulin lispro, levocetirizine, levothyroxine, lisinopril, loperamide prn, melatonin, metFORMIN, multivitamin, omeprazole, potassium chloride, sildenafil, tamsulosin, testosterone injection, tiotropium bromide monohydrate inhaler, tiZANidine, VICTOZA, Vitamin D, voltaren tab and gel, ziprasidone  Progress:  Patient reports recent hospitalization due to fluid retention; he is now taking his diuretic medication regularly and reports improved fluid status and weight loss.   Weight today is 2lbs more than previous weight on 03/01/19.   Patient is receiving frozen meals through [facility] plan, continues with 2 protein shakes daily + one balanced meal. He would like to continue this pattern.   He has been experiencing some hypoglycemia; lispro insulin has been reduced from 20 units to 10 units with meals, but he states he needs to drink sugar-sweetened tea during the day to prevent low BGs.   DIETARY INTAKE: 24-hr recall:  Breakfast: Ensure Max protein  Snack: none  Lunch: Ensure Max protein Snack: none Dinner: 21 frozen meals sent to home -- [employer] and [employer]; pork ribs, hamburger on wheat bread; meat + veg ie green beans cran apple crisp with wg pancakes and [place] Snack: none Beverages: water, tea with lemon juice (1c sugar in 2qt pitcher) to treat low BGs  Usual physical activity: decreased walking but increased ADLs associated with recent move to new home  Diet to Follow:  15-30 g carbohydrates, enough to prevent low BGs  Continue with current eating pattern per patient  preference.   Continue with multivitamin supplementation               Nutritional Diagnosis:  [place]-3.3 Overweight/obesity related to history of excess calories and physical inactivity as evidenced by patient current BMI of 51.8 following dietary guidelines for continued weight loss prior to bariatric surgery.              Intervention:   . Nutrition counseling for upcoming bariatric surgery.  Teaching Method Utilized:  Auditory    Barriers to learning/adherence to lifestyle change: none  Demonstrated degree of understanding via:  Teach Back   Monitoring/Evaluation:  Dietary intake, exercise, BG control, and body weight return for #5 SWL visit on 05/02/19.

## 2019-05-02 ENCOUNTER — Encounter: Payer: Self-pay | Admitting: Dietician

## 2019-05-02 ENCOUNTER — Encounter: Payer: Medicaid Other | Attending: Surgery | Admitting: Dietician

## 2019-05-02 ENCOUNTER — Other Ambulatory Visit: Payer: Self-pay

## 2019-05-02 VITALS — Ht 75.0 in | Wt >= 6400 oz

## 2019-05-02 DIAGNOSIS — Z713 Dietary counseling and surveillance: Secondary | ICD-10-CM | POA: Insufficient documentation

## 2019-05-02 DIAGNOSIS — Z6841 Body Mass Index (BMI) 40.0 and over, adult: Secondary | ICD-10-CM | POA: Insufficient documentation

## 2019-05-02 DIAGNOSIS — E1165 Type 2 diabetes mellitus with hyperglycemia: Secondary | ICD-10-CM

## 2019-05-02 DIAGNOSIS — E119 Type 2 diabetes mellitus without complications: Secondary | ICD-10-CM | POA: Insufficient documentation

## 2019-05-02 DIAGNOSIS — Z794 Long term (current) use of insulin: Secondary | ICD-10-CM

## 2019-05-02 NOTE — Progress Notes (Signed)
Appt start time: 1030 end time:  1100.  Assessment:   #5 SWL Appointment.   Start Wt at NDES: 419.3lbs Wt: 411lbs Ht: 6'3" BMI: 51.5  Preferred Learning Style:   Auditory   Learning Readiness:   Change in progress  MEDICATIONS: albuterol inhaler, aspirin, budesonide-formoterol inhaler, buPROPion, citalopram, fluticasone nasal spray, furosemide, gabapentin, Insulin Glargine 300U/ml, insulin lispro, levocetirizine, levothyroxine, lisinopril, loperamide prn, melatonin, metFORMIN, multivitamin, omeprazole, potassium chloride, sildenafil, tamsulosin, testosterone injection, tiotropium bromide monohydrate inhaler, tiZANidine, VICTOZA, Vitamin D, voltaren tab and gel, ziprasidone   Progress:  Weight loss of about 4lbs in the past month; patient continues with partial liquid diet and has increased physical activity.  He does still occasionally have some low BG reactions, once during the night in the past month. MD continues to reduce mealtime insulin, now down to 2 units before meals.   DIETARY INTAKE: 24-hr recall:  Breakfast: Ensure Max protein drink or occasionally boiled eggs Snack: none  Lunch: Ensure Max protein drink Snack: none Dinner: Psychologist, educational frozen meal, fruit ie cantaloupe or watermelon Snack: none Beverages: water, diet Lipton green tea mixed berry and watermelon  Usual physical activity: walking dog 20 minutes 6-7 days a week  Diet to Follow:  15-30 g carbohydrates               Nutritional Diagnosis:  [place]-3.3 Overweight/obesity related to history of excess calories and physical inactivity as evidenced by patient current BMI of 51.5, following dietary guidelines for continued weight loss prior to bariatric surgery.              Intervention:   . Nutrition counseling for weight loss prior to bariatric surgery.  Patient continues to follow low-calorie eating pattern and feels it is working well for him. Cautioned patient regarding possibility  of burnout with protein drinks.   Advised patient to eat a bedtime snack of 15g carbohydrate with protein to reduce risk of nighttime hypoglycemia.   Teaching Method Utilized:  Auditory  Barriers to learning/adherence to lifestyle change: none  Demonstrated degree of understanding via:  Teach Back   Monitoring/Evaluation:  Dietary intake, exercise, and body weight 06/01/19 at 11:00am.

## 2019-05-02 NOTE — Patient Instructions (Signed)
   Continue with pattern of small meals, and emphasize protein along with low-carb veggies and only small portions of starchy foods.   Eat a snack of 1 carb serving (15 grams) along with a protein food (egg, cheese, peanut butter) in the evening, especially after taking a walk. This might help prevent low blood sugars during the night.

## 2019-05-09 ENCOUNTER — Other Ambulatory Visit: Payer: Self-pay | Admitting: Family Medicine

## 2019-05-17 ENCOUNTER — Ambulatory Visit: Payer: Medicare Other | Admitting: Nurse Practitioner

## 2019-05-25 ENCOUNTER — Other Ambulatory Visit: Payer: Medicare HMO

## 2019-05-28 ENCOUNTER — Ambulatory Visit: Payer: Medicare HMO | Admitting: Urology

## 2019-06-01 ENCOUNTER — Other Ambulatory Visit: Payer: Self-pay

## 2019-06-01 ENCOUNTER — Encounter: Payer: 59 | Attending: Surgery | Admitting: Dietician

## 2019-06-01 ENCOUNTER — Encounter: Payer: Self-pay | Admitting: Dietician

## 2019-06-01 DIAGNOSIS — Z794 Long term (current) use of insulin: Secondary | ICD-10-CM

## 2019-06-01 DIAGNOSIS — Z713 Dietary counseling and surveillance: Secondary | ICD-10-CM | POA: Insufficient documentation

## 2019-06-01 DIAGNOSIS — Z6841 Body Mass Index (BMI) 40.0 and over, adult: Secondary | ICD-10-CM | POA: Insufficient documentation

## 2019-06-01 DIAGNOSIS — E119 Type 2 diabetes mellitus without complications: Secondary | ICD-10-CM | POA: Insufficient documentation

## 2019-06-01 DIAGNOSIS — E1165 Type 2 diabetes mellitus with hyperglycemia: Secondary | ICD-10-CM

## 2019-06-01 NOTE — Patient Instructions (Signed)
   Continue with current eating pattern -- emphasize lean protein foods and low-carb vegetables, and include only small portions of any starchy food or fruit to prevent low blood sugar.  Continue with regular exercise as tolerated.

## 2019-06-01 NOTE — Progress Notes (Signed)
Appt start time: 1120 end time:  1150.  Assessment:   #6 SWL Appointment.   Start Wt at NDES: 419.3lbs Wt: 415lbs Ht: 6'3" BMI: 51.87  Preferred Learning Style:   Auditory  Learning Readiness:   Change in progress  MEDICATIONS: albuterol inhaler, aspirin, budesonide-formoterol inhaler, buPROPion, citalopram, fluticasone nasal spray, furosemide, gabapentin, Insulin Glargine 300U/ml, insulin lispro, levocetirizine, levothyroxine, lisinopril,loperamide prn,melatonin, metFORMIN, multivitamin, omeprazole, potassium chloride, sildenafil, tamsulosin, testosterone injection, tiotropium bromide monohydrate inhaler, tiZANidine, VICTOZA, Vitamin D,voltaren tab and gel,ziprasidone  Progress:   Patient reports weight has been stable recently; he feels he has lost some inches rather than pounds.   He continues to limit carb intake but has included some carbohydrate to prevent hypoglycemia. He states he has not had any hypoglycemia episodes recently.  Patient reports increase in muscle pain for which he saw MD this morning.   DIETARY INTAKE: 24-hr recall:  Breakfast: Ensure Protein max  Snack: lance cheese crackers 1/2 pkg  Lunch: boiled egg, tiast Snack: none or same as am Dinner: bbq chicken, beans, fruit ie watermelon or can Snack: same as am Beverages: water, diet Lipton green tea, sugar free lemonade  Usual physical activity: walking 30 minutes 6-7 days per week  Diet to Follow: Protein + low-carb vegetables + small portions of starch or fruit to prevent hypoglycemia               Nutritional Diagnosis:  [place]-3.3 Overweight/obesity related to history of excess calories and physical inactivity as evidenced by patient current BMI of 51.87 following dietary guidelines for continued weight loss prior to bariatric surgery.              Intervention:   . Nutrition counseling for weight loss prior to upcoming bariatric surgery. . Reviewed progress since previous visit . Reviewed  pre-op diet, vitamin supplementation  . Discussed effects of exercise and food intake on blood sugars and insulin.  . Reviewed pre-op diet goals; patient is following goals already.  . Patient has good support from husband and sister-in-law -- sister-in-law had gastric bypass surgery.  . Patient is motivated and ready to complete surgery and follow bariatric diet.   Teaching Method Utilized:  Auditory    Barriers to learning/adherence to lifestyle change: none  Demonstrated degree of understanding via:  Teach Back   Monitoring/Evaluation:  Dietary intake, exercise, BG control, and body weight pre-op class 06/29/19.

## 2019-06-10 ENCOUNTER — Other Ambulatory Visit: Payer: Self-pay | Admitting: Nurse Practitioner

## 2019-06-10 DIAGNOSIS — J301 Allergic rhinitis due to pollen: Secondary | ICD-10-CM

## 2019-06-15 ENCOUNTER — Telehealth: Payer: Self-pay | Admitting: Dietician

## 2019-06-15 NOTE — Telephone Encounter (Signed)
Returned a Advertising account executive from patient requesting a call. Left message for him, and confirmed date and time and location of his next appointment for pre-op class on 06/29/19 at 9am. Patient to call back if he has any other questions.

## 2019-06-15 NOTE — Telephone Encounter (Signed)
Patient returned call. He reports speaking with his health insurance provider which advised him to make sure all necessary paperwork was submitted to them for pre-approval for surgery, in order to ensure he can have surgery before the end of the year. Forwarded the message to [facility] via email.

## 2019-06-28 ENCOUNTER — Ambulatory Visit: Payer: Self-pay | Admitting: Surgery

## 2019-06-28 NOTE — H&P (Signed)
Chief Complaint:  Morbid obesity   History of Present Illness:  Philip [name] is an 58 y.o. male with morbid obesity (weight 419 lbs) who has been seen in the office with his partner and counseled regarding bariatric surgery.  He was seen in January and in Sept 2020 and informed consent has been obtained.  He has severe centripedal obesity and has a large piece of mesh from a hernia repair in [facility].  In addition he was hospitalized in [place], [place] in June for panniculitis.    Past Medical History:  Diagnosis Date  . Anxiety   . Asthma   . Bipolar 1 disorder (HCC)   . COPD (chronic obstructive pulmonary disease) (HCC)   . Depression   . Diabetes mellitus without complication (HCC)   . GERD (gastroesophageal reflux disease)   . Osteoarthritis   . Thyroid disease     Past Surgical History:  Procedure Laterality Date  . CHOLECYSTECTOMY    . SHOULDER SURGERY Right   . UMBILICAL HERNIA REPAIR      Current Outpatient Medications  Medication Sig Dispense Refill  . albuterol (VENTOLIN HFA) 108 (90 Base) MCG/ACT inhaler INHALE 1 TO 2 puffs by mouth every 4-6 hours prn for wheezing and shortness of breath 18 g 1  . aspirin EC 81 MG tablet Take 81 mg by mouth daily.    . B-D ULTRAFINE III SHORT PEN 31G X 8 MM MISC Inject 1 Device into the skin 5 (five) times daily. 450 each 0  . budesonide-formoterol (SYMBICORT) 160-4.5 MCG/ACT inhaler Inhale 2 puffs into the lungs 2 (two) times daily. 3 Inhaler 1  . buPROPion (WELLBUTRIN SR) 200 MG 12 hr tablet TAKE 1 TABLET BY MOUTH  TWICE DAILY 180 tablet 0  . Cholecalciferol (VITAMIN D PO) Take 1 capsule by mouth daily.    . citalopram (CELEXA) 40 MG tablet Take 1 tablet (40 mg total) by mouth daily. 30 tablet 1  . collagenase (SANTYL) ointment Apply thin layer to wound 1-2 times daily until healing has begun 15 g 1  . diclofenac (VOLTAREN) 75 MG EC tablet Take 1 tablet (75 mg total) by mouth 2 (two) times daily. 60 tablet 0  . diclofenac sodium  (VOLTAREN) 1 % GEL Apply 4 g topically 4 (four) times daily. 3 Tube 1  . fluticasone (FLONASE) 50 MCG/ACT nasal spray Place 2 sprays into both nostrils daily. 16 g 6  . furosemide (LASIX) 40 MG tablet Take 1 tablet (40 mg total) by mouth daily. 90 tablet 1  . gabapentin (NEURONTIN) 400 MG capsule Take 2 capsules (800 mg total) by mouth 4 (four) times daily. 720 capsule 1  . Insulin Glargine, 2 Unit Dial, (TOUJEO MAX SOLOSTAR) 300 UNIT/ML SOPN Inject 70 Units into the skin 2 (two) times daily. 14 pen 1  . insulin lispro (HUMALOG KWIKPEN) 100 UNIT/ML KwikPen INJECT 20 UNITS UNDER THE SKIN TID BEFORE MEALS plus Sliding Scale correction up to 15 additional units per dose. 45 mL 4  . levocetirizine (XYZAL) 5 MG tablet TAKE 1 TABLET(5 MG) BY MOUTH EVERY EVENING 30 tablet 5  . levothyroxine (SYNTHROID, LEVOTHROID) 200 MCG tablet Take 1 tablet (200 mcg total) by mouth daily before breakfast. 90 tablet 1  . levothyroxine (SYNTHROID, LEVOTHROID) 25 MCG tablet TAKE 1 TABLET(25 MCG) BY MOUTH DAILY BEFORE BREAKFAST with 200 mcg tablet for 225 mcg total dose. 90 tablet 1  . lisinopril (PRINIVIL,ZESTRIL) 40 MG tablet Take 1 tablet (40 mg total) by mouth daily. 90  tablet 1  . loperamide (IMODIUM) 2 MG capsule Take by mouth.    . metFORMIN (GLUCOPHAGE-XR) 500 MG 24 hr tablet TAKE 2 TABLETS(1000 MG) BY MOUTH TWICE DAILY WITH A MEAL 360 tablet 1  . Multiple Vitamin (MULTIVITAMIN WITH MINERALS) TABS tablet Take 1 tablet by mouth daily.    . multivitamin-lutein (OCUVITE-LUTEIN) CAPS capsule Take 1 capsule by mouth daily.    Marland Kitchen NEEDLE, DISP, 18 G 18G X 1-1/2" MISC 1 Container by Does not apply route once a week. 1 each 0  . NEEDLE, DISP, 21 G 21G X 1-1/2" MISC 1 Container by Does not apply route once a week. 1 each 0  . NEEDLE, DISP, 21 G 21G X 1-1/2" MISC For testosterone injections as directed 12 each 2  . Nutritional Supplements (GLUCERNA 1.0 CAL/CARBSTEADY) LIQD Take 1 Bottle by mouth daily as needed. 7110 mL 1  .  omeprazole (PRILOSEC) 40 MG capsule TAKE 1 CAPSULE(40 MG) BY MOUTH TWICE DAILY 180 capsule 1  . potassium chloride SA (K-DUR,KLOR-CON) 20 MEQ tablet Take 1 tablet (20 mEq total) by mouth daily. 90 tablet 1  . sildenafil (REVATIO) 20 MG tablet 2-5 tabs 1 hour prior to intercourse 30 tablet 3  . Syringe, Disposable, (2-3CC SYRINGE) 3 ML MISC 1 Container by Does not apply route once a week. 1 each 0  . testosterone cypionate (DEPOTESTOSTERONE CYPIONATE) 200 MG/ML injection INJECT 0.7 MLS INTO THE MUSCLE ONCE A WEEK 4 mL 2  . tiZANidine (ZANAFLEX) 4 MG tablet TAKE 1 TABLET(4 MG) BY MOUTH AT BEDTIME 30 tablet 0  . VICTOZA 18 MG/3ML SOPN INJECT 1.8MG  SUBCUTANEOUSLY DAILY 18 mL 1  . ziprasidone (GEODON) 20 MG capsule Take 1 capsule (20 mg total) by mouth 2 (two) times daily with a meal. 60 capsule 1   No current facility-administered medications for this visit.    Aripiprazole, Duloxetine hcl, Fenofibrate, Lamotrigine, Neosporin [neomycin-bacitracin zn-polymyx], Pregabalin, Simvastatin, Latex, and Valproic acid Family History  Problem Relation Age of Onset  . Depression Mother   . Diabetes Mother   . Heart disease Mother   . Depression Father   . Lung cancer Father   . Diabetes Father   . Breast cancer Sister   . Anxiety disorder Sister   . Depression Sister   . Stroke Neg Hx    Social History:   reports that he has never smoked. He has never used smokeless tobacco. He reports current alcohol use. He reports that he does not use drugs.   REVIEW OF SYSTEMS : Negative except for see problem list  Physical Exam:   There were no vitals taken for this visit. There is no height or weight on file to calculate BMI.  Gen:  WDWN WM NAD  Neurological: Alert and oriented to person, place, and time. Motor and sensory function is grossly intact  Head: Normocephalic and atraumatic.  Eyes: Conjunctivae are normal. Pupils are equal, round, and reactive to light. No scleral icterus.  Neck: Normal range  of motion. Neck supple. No tracheal deviation or thyromegaly present.  Cardiovascular:  SR without murmurs or gallops.  No carotid bruits Breast:  Not examined Respiratory: Effort normal.  No respiratory distress. No chest wall tenderness. Breath sounds normal.  No wheezes, rales or rhonchi.  Abdomen:  Large pannus with mesh repair.  Lower panniculitis has healed with brawny skin GU:  Covered by pannus Musculoskeletal: Normal range of motion. Extremities are nontender. Skin changes from venous stasis noted bilaterally.   Lymphadenopathy: No cervical, preauricular,  postauricular or axillary adenopathy is present Skin: Skin is warm and dry. No rash noted. No diaphoresis. No erythema. No pallor. Pscyh: Normal mood and affect. Behavior is normal. Judgment and thought content normal.   LABORATORY RESULTS: No results found for this or any previous visit (from the past 48 hour(s)).   RADIOLOGY RESULTS: No results found.  Problem List: Patient Active Problem List   Diagnosis Date Noted  . Nocturnal hypoxia 09/19/2018  . Hypoventilation associated with obesity syndrome (HCC) 08/11/2018  . Morbid obesity with body mass index (BMI) of 50.0 to 59.9 in adult (HCC) 02/07/2018  . Hypogonadism in male 01/09/2018  . Erectile dysfunction 01/09/2018  . Benign prostatic hyperplasia with lower urinary tract symptoms 01/09/2018  . Chronic respiratory failure with hypercapnia (HCC) 07/09/2017  . Asthma 07/05/2017  . COPD (chronic obstructive pulmonary disease) (HCC) 07/05/2017  . GERD (gastroesophageal reflux disease) 07/05/2017  . Type 2 diabetes mellitus with autonomic neuropathy (HCC) 07/05/2017  . Hypothyroid 07/05/2017  . Osteoarthritis 07/05/2017  . Bipolar 1 disorder (HCC) 07/05/2017    Assessment & Plan: Morbid obesity with BMI > 50;  Laparoscopy and determination if roux is possible and safe or if should proceed with sleeve gastrectomy.  I have reviewed both procedures with him and his partner  [place]      Philip B. Daphine DeutscherMartin, MD, [facility]  [facility] [phone] beeper [phone]  06/28/2019 11:14 AM

## 2019-06-29 ENCOUNTER — Encounter: Payer: 59 | Attending: Surgery | Admitting: Dietician

## 2019-06-29 ENCOUNTER — Other Ambulatory Visit: Payer: Self-pay

## 2019-06-29 ENCOUNTER — Encounter: Payer: Self-pay | Admitting: Dietician

## 2019-06-29 DIAGNOSIS — Z6841 Body Mass Index (BMI) 40.0 and over, adult: Secondary | ICD-10-CM | POA: Insufficient documentation

## 2019-06-29 DIAGNOSIS — Z713 Dietary counseling and surveillance: Secondary | ICD-10-CM | POA: Insufficient documentation

## 2019-06-29 DIAGNOSIS — E119 Type 2 diabetes mellitus without complications: Secondary | ICD-10-CM | POA: Insufficient documentation

## 2019-06-29 NOTE — Patient Outreach (Signed)
[facility]) Care Management  06/29/2019  Philip [name] 05-07-1961 [id number]   Medication Adherence call to Philip [name] Hippa Identifiers Verify spoke with patient he is past due on Atorvastatin 10 mg patient explain he is no longer taking this medication patient was having side effects doctor took him off. Philip [name] is showing past due under [place] [employer].   [facility] Direct Dial [phone]  Fax [phone] [name]

## 2019-06-29 NOTE — Progress Notes (Signed)
Pre-Operative Nutrition Class:  Appt start time: 0900   End time:  1100.  Patient was seen on 06/29/19 for Pre-Operative Bariatric Surgery Education at the Nutrition and Diabetes Education Services at [facility].   Surgery date: TBD Surgery type: Sleeve Gastrectomy Start weight at [facility]: 419.3lbs Weight today: 416.0lbs  InBody  BODY COMP RESULTS    BMI (kg/m^2) 52  Fat Mass (lbs) 183.2  Fat Free Mass (lbs) 232.8  Total Body Water (lbs) 172.2   Samples given per MNT protocol. Patient educated on appropriate usage: Celebrate Multivitamin    Lot#: [id number] Exp: 08/2020;     Lot#: [id number] Exp: 08/2020  Celebrate Iron soft chews    Lot#: [id number],  Exp: 03/2021;  Lot#: [id number],   Exp: 03/2021  Celebrate Vitamins Calcium Citrate   Lot# [id number], Exp: 06/2020; Lot#: [id number], Exp: 03/2020; Lot#: [id number], Exp: 04/2020; Lot# [id number], Exp: 05/2020; Lot# [id number], Exp: 09/2019; Lot#: [id number], Exp: 04/2020; Lot#: [id number], Exp: 04/2020; Lot#: [id number], Exp: 08/2020   Renee Pain Protein Powder   Lot # [id number], Exp: 03/2020;  Lot#: [id number], Exp: 03/2020;  Lot#: [id number], Exp: 03/2020  Unjury protein shake Zonia Kief [id number],  Exp: 09/30/19   The following the learning objectives were met by the patient during this course:  Identify Pre-Op Dietary Goals and will begin 2 weeks pre-operatively  Identify appropriate sources of fluids and proteins   State protein recommendations and appropriate sources pre and post-operatively  Identify Post-Operative Dietary Goals and will follow for 2 weeks post-operatively  Identify appropriate multivitamin and calcium sources  Describe the need for physical activity post-operatively and will follow MD recommendations  State when to call healthcare provider regarding medication questions or post-operative complications  Handouts given during class include:  Pre-Op Bariatric Surgery Diet Handout  Protein Shake Handout  Post-Op Bariatric Surgery Nutrition Handout  BELT Program Information  Flyer  Support Group Information Flyer  [facility] Bariatric Supplements Price List  Follow-Up Plan: Patient will follow-up at [place], at about 2 weeks post operatively for diet advancement per MD.

## 2019-06-30 ENCOUNTER — Other Ambulatory Visit: Payer: Self-pay | Admitting: Family Medicine

## 2019-06-30 DIAGNOSIS — E119 Type 2 diabetes mellitus without complications: Secondary | ICD-10-CM

## 2019-07-17 ENCOUNTER — Other Ambulatory Visit: Payer: Self-pay

## 2019-07-17 NOTE — Patient Outreach (Signed)
[facility]) Care Management  07/17/2019  Philip [name] 1961/02/13 [id number]   Medication Adherence call to Philip [name] Hippa Identifiers Verify spoke with patient he is showing past due on Lisinopril 40 mg and Atorvastatin 10 mg patient explain he has already received Lisinopril from [facility] not from mail order [facility] because at this time he is not receiving mail, patient is no longer taking Atorvastatin.Philip [name] is showing past due under [place].   [facility] Management Direct Dial [phone]  Fax [phone] [name]

## 2019-08-10 ENCOUNTER — Telehealth: Payer: Self-pay | Admitting: Dietician

## 2019-08-10 NOTE — Telephone Encounter (Signed)
Returned message from patient to check on the status of his surgery. After communicating with bariatric coordinator by email, called patient back to let him know they need labwork results as well as note from psych visit. Advised patient to contact [facility] to inform them if and when he has/ had these requirements completed.

## 2019-08-10 NOTE — Telephone Encounter (Signed)
Patient returned call and will contact [facility] to discuss requirements for scheduling his procedure.

## 2019-09-04 NOTE — Patient Instructions (Addendum)
DUE TO COVID-19 ONLY ONE VISITOR IS ALLOWED TO COME WITH YOU AND STAY IN THE WAITING ROOM ONLY DURING PRE OP AND PROCEDURE. THE ONE VISITOR MAY VISIT WITH YOU IN YOUR PRIVATE ROOM DURING VISITING HOURS ONLY!!   COVID SWAB TESTING MUST BE COMPLETED ON:  09/07/2019  [address], [place] [facility] enter pre surgical testing line (Must self quarantine after testing. Follow instructions on handout.)        [address] [place], [place] - the short stay covered drive at [facility] (Use the [employer] entrance to [facility] next to [facility].) (Must self quarantine after testing. Follow instructions on handout.)  [facility] Entrance [address] (Must self quarantine after testing. Follow instructions on handout.)       Your procedure is scheduled on: 09/11/2019   Report to [facility] Main  Entrance   Report to Short Stay at 5:30 AM      Call this number if you have problems the morning of surgery [phone]   Do not eat food or drink liquids     MORNING OF SURGERY DRINK:   DRINK 1 G2 drink BEFORE YOU LEAVE HOME, DRINK ALL OF THE  G2 DRINK AT ONE TIME.    NO SOLID FOOD AFTER 600 PM THE NIGHT BEFORE YOUR SURGERY. YOU MAY DRINK CLEAR FLUIDS. THE G2 DRINK YOU DRINK BEFORE YOU LEAVE HOME WILL BE THE LAST FLUIDS YOU DRINK BEFORE SURGERY. PLEASE CONSUME BY 4:30 am     CLEAR LIQUID DIET   Foods Allowed                                                                     Foods Excluded  Coffee and tea, regular and decaf                             liquids that you cannot  Plain Jell-O any favor except red or purple                                           see through such as: Fruit ices (not with fruit pulp)                                     milk, soups, orange juice  Iced Popsicles                                    All solid food Carbonated beverages, regular and diet                                     Cranberry, grape and apple juices Sports drinks like Gatorade Lightly seasoned clear broth or consume(fat free) Sugar, honey syrup  Sample Menu Breakfast  Lunch                                     Supper Cranberry juice                    Beef broth                            Chicken broth Jell-O                                     Grape juice                           Apple juice Coffee or tea                        Jell-O                                      Popsicle                                                Coffee or tea                        Coffee or tea  _____________________________________________________________________   PAIN IS EXPECTED AFTER SURGERY AND WILL NOT BE COMPLETELY ELIMINATED. AMBULATION AND TYLENOL WILL HELP REDUCE INCISIONAL AND GAS PAIN. MOVEMENT IS KEY!  YOU ARE EXPECTED TO BE OUT OF BED WITHIN 4 HOURS OF ADMISSION TO YOUR PATIENT ROOM.  SITTING IN THE RECLINER THROUGHOUT THE DAY IS IMPORTANT FOR DRINKING FLUIDS AND MOVING GAS THROUGHOUT THE GI TRACT.  COMPRESSION STOCKINGS SHOULD BE WORN [facility] STAY UNLESS YOU ARE WALKING.   INCENTIVE SPIROMETER SHOULD BE USED EVERY HOUR WHILE AWAKE TO DECREASE POST-OPERATIVE COMPLICATIONS SUCH AS PNEUMONIA.  WHEN DISCHARGED HOME, IT IS IMPORTANT TO CONTINUE TO WALK EVERY HOUR AND USE THE INCENTIVE SPIROMETER EVERY HOUR.     Brush your teeth the morning of surgery.   Do NOT smoke after Midnight   Take these medicines the morning of surgery with A SIP OF WATER: Wellbutrin, Neurontin, Synthroid and Prilosec.              You may not have any metal on your body including hair pins, jewelry, and body piercing              Do not wear lotions, powders, perfumes/cologne, or deodorant                    Men may shave face and neck.   Do not bring valuables to the hospital. [place] IS NOT             RESPONSIBLE   FOR  VALUABLES.   Contacts, dentures or bridgework may not be worn into surgery.   Bring small overnight bag day of surgery.   Special Instructions: Bring a copy of your healthcare power of attorney and living will documents  the day of surgery if you haven't scanned them in before.              How to Manage Your Diabetes Before and After Surgery  Why is it important to control my blood sugar before and after surgery? . Improving blood sugar levels before and after surgery helps healing and can limit problems. . A way of improving blood sugar control is eating a healthy diet by: o  Eating less sugar and carbohydrates o  Increasing activity/exercise o  Talking with your doctor about reaching your blood sugar goals . High blood sugars (greater than 180 mg/dL) can raise your risk of infections and slow your recovery, so you will need to focus on controlling your diabetes during the weeks before surgery. . Make sure that the doctor who takes care of your diabetes knows about your planned surgery including the date and location.  How do I manage my blood sugar before surgery? . Check your blood sugar at least 4 times a day, starting 2 days before surgery, to make sure that the level is not too high or low. o Check your blood sugar the morning of your surgery when you wake up and every 2 hours until you get to the Short Stay unit. . If your blood sugar is less than 70 mg/dL, you will need to treat for low blood sugar: o Do not take insulin. o Treat a low blood sugar (less than 70 mg/dL) with  cup of clear juice (cranberry or apple), 4 glucose tablets, OR glucose gel. o Recheck blood sugar in 15 minutes after treatment (to make sure it is greater than 70 mg/dL). If your blood sugar is not greater than 70 mg/dL on recheck, call [phone] for further instructions. . Report your blood sugar to the short stay nurse when you get to Short Stay.  . If you are admitted to the hospital after  surgery: o Your blood sugar will be checked by the staff and you will probably be given insulin after surgery (instead of oral diabetes medicines) to make sure you have good blood sugar levels. o The goal for blood sugar control after surgery is 80-180 mg/dL.   WHAT DO I DO ABOUT MY DIABETES MEDICATION:  . THE DAY BEFORE SURGERY:           Take your usual Metformin and 120 U of your morning dose of Toujeo. Also, Take  your 20 U of Humalog at meal time. .       THE NIGHT BEFORE SURGERY,      Do not take any Humalog insulin.  However, take only 60 of your Toujeo at Bedtime   THE DAY OF SURGERY:        Do not take oral diabetes medicines (pills) the morning of surgery.              However if your CBG is greater than 220 mg/dl, you may take 1/2 of your sliding scale of Humalog.        You may also take 60 Units of Tujeo the morning of surgery.                 [place] - Preparing for Surgery Before surgery, you can play an important role.  Because skin is not sterile, your skin needs to be as free of germs as possible.  You can reduce the number of germs on your skin by washing with CHG (chlorahexidine gluconate) soap before surgery.  CHG is an antiseptic cleaner which kills germs and bonds with the skin to continue killing germs even after washing. Please DO NOT use if you have an allergy to CHG or antibacterial soaps.  If your skin becomes reddened/irritated stop using the CHG and inform your nurse when you arrive at Short Stay. Do not shave (including legs and underarms) for at least 48 hours prior to the first CHG shower.  You may shave your face/neck. Please follow these instructions carefully:  1.  Shower with CHG Soap the night before surgery and the  morning of Surgery.  2.  If you choose to wash your hair, wash your hair first as usual with your  normal  shampoo.  3.  After you shampoo, rinse your hair and body thoroughly to remove the  shampoo.                            4.  Use CHG as you would any other liquid soap.  You can apply chg directly  to the skin and wash                       Gently with a scrungie or clean washcloth.  5.  Apply the CHG Soap to your body ONLY FROM THE NECK DOWN.   Do not use on face/ open                           Wound or open sores. Avoid contact with eyes, ears mouth and genitals (private parts).                       Wash face,  Genitals (private parts) with your normal soap.             6.  Wash thoroughly, paying special attention to the area where your surgery  will be performed.  7.  Thoroughly rinse your body with warm water from the neck down.  8.  DO NOT shower/wash with your normal soap after using and rinsing off  the CHG Soap.                9.  Pat yourself dry with a clean towel.            10.  Wear clean pajamas.            11.  Place clean sheets on your bed the night of your first shower and do not  sleep with pets. Day of Surgery : Do not apply any lotions/deodorants the morning of surgery.  Please wear clean clothes to the hospital/surgery center.  FAILURE TO FOLLOW THESE INSTRUCTIONS MAY RESULT IN THE CANCELLATION OF YOUR SURGERY PATIENT SIGNATURE_________________________________  NURSE SIGNATURE__________________________________  ________________________________________________________________________  Philip [name]  An incentive spirometer is a tool that can help keep your lungs clear and active. This tool measures how well you are filling your lungs with each breath. Taking long deep breaths may help reverse or decrease the chance of developing breathing (pulmonary) problems (especially infection) following:  A long period of time when you are unable to move or be active. BEFORE THE PROCEDURE   If the spirometer includes an indicator to show your best effort, your nurse or respiratory therapist will set it to a desired goal.  If possible, sit up straight or lean slightly forward. Try not to  slouch.  Hold the incentive spirometer in an upright position. INSTRUCTIONS FOR USE  1. Sit on the edge of your bed if possible, or sit up as far as you can in bed or on a chair. 2. Hold the incentive spirometer in an upright position. 3. Breathe out normally. 4. Place the mouthpiece in your mouth and seal your lips tightly around it. 5. Breathe in slowly and as deeply as possible, raising the piston or the ball toward the top of the column. 6. Hold your breath for 3-5 seconds or for as long as possible. Allow the piston or ball to fall to the bottom of the column. 7. Remove the mouthpiece from your mouth and breathe out normally. 8. Rest for a few seconds and repeat Steps 1 through 7 at least 10 times every 1-2 hours when you are awake. Take your time and take a few normal breaths between deep breaths. 9. The spirometer may include an indicator to show your best effort. Use the indicator as a goal to work toward during each repetition. 10. After each set of 10 deep breaths, practice coughing to be sure your lungs are clear. If you have an incision (the cut made at the time of surgery), support your incision when coughing by placing a pillow or rolled up towels firmly against it. Once you are able to get out of bed, walk around indoors and cough well. You may stop using the incentive spirometer when instructed by your caregiver.  RISKS AND COMPLICATIONS  Take your time so you do not get dizzy or light-headed.  If you are in pain, you may need to take or ask for pain medication before doing incentive spirometry. It is harder to take a deep breath if you are having pain. AFTER USE  Rest and breathe slowly and easily.  It can be helpful to keep track of a log of your progress. Your caregiver can provide you with a simple table to help with this. If you are using the spirometer at home, follow these instructions: SEEK MEDICAL CARE IF:   You are having difficultly using the spirometer.  You  have trouble using the spirometer as often as instructed.  Your pain medication is not giving enough relief while using the spirometer.  You develop fever of 100.5 F (38.1 C) or higher. SEEK IMMEDIATE MEDICAL CARE IF:   You cough up bloody sputum that had not been present before.  You develop fever of 102 F (38.9 C) or greater.  You develop worsening pain at or near the incision site. MAKE SURE YOU:   Understand these instructions.  Will watch your condition.  Will get help right away if you are not doing well or get worse. Document Released: 02/07/2007 Document Revised: 12/20/2011 Document Reviewed: 04/10/2007 [facility] Patient Information 2014 [place], [place].   ________________________________________________________________________

## 2019-09-04 NOTE — Progress Notes (Signed)
PCP -  Cardiologist -   Chest x-ray - 11-21-18  EKG -  Stress Test -  ECHO -  Cardiac Cath -   Sleep Study -  CPAP -   Fasting Blood Sugar -  Checks Blood Sugar _____ times a day  Blood Thinner Instructions: Aspirin Instructions: Last Dose:  Anesthesia review: Hx of DM, COPD  Patient denies shortness of breath, fever, cough and chest pain at PAT appointment   Patient verbalized understanding of instructions that were given to them at the PAT appointment. Patient was also instructed that they will need to review over the PAT instructions again at home before surgery.

## 2019-09-05 ENCOUNTER — Encounter (HOSPITAL_COMMUNITY)
Admission: RE | Admit: 2019-09-05 | Discharge: 2019-09-05 | Disposition: A | Discharge status: Home / Self Care | Payer: 59 | Source: Ambulatory Visit

## 2019-09-05 ENCOUNTER — Other Ambulatory Visit (HOSPITAL_COMMUNITY): Payer: 59

## 2019-09-07 ENCOUNTER — Other Ambulatory Visit (HOSPITAL_COMMUNITY): Payer: 59

## 2019-09-11 NOTE — Patient Instructions (Signed)
DUE TO COVID-19 ONLY ONE VISITOR IS ALLOWED TO COME WITH YOU AND STAY IN THE WAITING ROOM ONLY DURING PRE OP AND PROCEDURE. THE ONE VISITOR MAY VISIT WITH YOU IN YOUR PRIVATE ROOM DURING VISITING HOURS ONLY!!   COVID SWAB TESTING MUST BE COMPLETED ON:  09/14/2019  [address], [place]  -[facility] enter pre surgical testing line (Must self quarantine after testing. Follow instructions on handout.)              Your procedure is scheduled on: 09/18/2019   Report to [facility] Main  Entrance   Report to Short Stay at 5:30 AM      Call this number if you have problems the morning of surgery [phone]   NO SOLID FOOD AFTER 6:00 PM THE NIGHT BEFORE YOUR SURGERY. YOU MAY DRINK CLEAR FLUIDS. MORNING OF SURGERY DRINK:   DRINK 1 G2 drink BEFORE YOU LEAVE HOME, DRINK ALL OF THE  G2 DRINK AT ONE TIME.   THE G2 DRINK YOU DRINK BEFORE YOU LEAVE HOME WILL BE THE LAST FLUIDS YOU DRINK BEFORE SURGERY.     CLEAR LIQUID DIET   Foods Allowed                                                                     Foods Excluded  Coffee and tea, regular and decaf                             liquids that you cannot  Plain Jell-O any favor except red or purple                                           see through such as: Fruit ices (not with fruit pulp)                                     milk, soups, orange juice  Iced Popsicles                                    All solid food Carbonated beverages, regular and diet                                    Cranberry, grape and apple juices Sports drinks like Gatorade Lightly seasoned clear broth or consume(fat free) Sugar, honey syrup  Sample Menu Breakfast                                Lunch                                     Supper Cranberry juice  Beef broth                            Chicken broth Jell-O                                     Grape juice                           Apple  juice Coffee or tea                        Jell-O                                      Popsicle                                                Coffee or tea                        Coffee or tea  _____________________________________________________________________     Brush your teeth the morning of surgery.   Do NOT smoke after Midnight   Take these medicines the morning of surgery with A SIP OF WATER: Wellbutrin, Neurontin, Synthroid, Prilosec, albuterol inhaler if needed (please bring), Symbicort inhaler                You may not have any metal on your body including hair pins, jewelry, and body piercing              Do not wear lotions, powders, perfumes/cologne, or deodorant                    Men may shave face and neck.   Do not bring valuables to the hospital. [facility] IS NOT             RESPONSIBLE   FOR VALUABLES.   Contacts, dentures or bridgework may not be worn into surgery.   Bring small overnight bag day of surgery.   Special Instructions: Bring a copy of your healthcare power of attorney and living will documents the day of surgery if you haven't scanned them in before.              How to Manage Your Diabetes Before and After Surgery  Why is it important to control my blood sugar before and after surgery? . Improving blood sugar levels before and after surgery helps healing and can limit problems. . A way of improving blood sugar control is eating a healthy diet by: o  Eating less sugar and carbohydrates o  Increasing activity/exercise o  Talking with your doctor about reaching your blood sugar goals . High blood sugars (greater than 180 mg/dL) can raise your risk of infections and slow your recovery, so you will need to focus on controlling your diabetes during the weeks before surgery. . Make sure that the doctor who takes care of your diabetes knows about your planned surgery including the date and location.  How do I manage my blood sugar before  surgery? . Check your blood  sugar at least 4 times a day, starting 2 days before surgery, to make sure that the level is not too high or low. o Check your blood sugar the morning of your surgery when you wake up and every 2 hours until you get to the Short Stay unit. . If your blood sugar is less than 70 mg/dL, you will need to treat for low blood sugar: o Do not take insulin. o Treat a low blood sugar (less than 70 mg/dL) with  cup of clear juice (cranberry or apple), 4 glucose tablets, OR glucose gel. o Recheck blood sugar in 15 minutes after treatment (to make sure it is greater than 70 mg/dL). If your blood sugar is not greater than 70 mg/dL on recheck, call [phone] for further instructions. . Report your blood sugar to the short stay nurse when you get to Short Stay.  . If you are admitted to the hospital after surgery: o Your blood sugar will be checked by the staff and you will probably be given insulin after surgery (instead of oral diabetes medicines) to make sure you have good blood sugar levels. o The goal for blood sugar control after surgery is 80-180 mg/dL.   WHAT DO I DO ABOUT MY DIABETES MEDICATION:  . THE DAY BEFORE SURGERY 12-7 o METFORMIN: TAKE AS USUAL  o TOUJEO: TAKE NORMAL MORNING DOSE. ONLY TAKE HALF OF BEDTIME DOSE o HUMALOG INSULIN: TAKE NORMAL BREAKFAST AND LUNCH DOSE. DO NOT TAKE DINNER DOSE. o VICTOZA: TAKE AS USUAL          THE MORNING OF SURGERY 12-8 o METFROMIN: DO NOT TAKE o TOUJEO: ONLY TAKE HALF OF MORNING DOSE o HUMALOG INSULIN: IF YOUR BLOOD SUGAR IS HIGHER THAN 220 , YOU MAY TAKE HALF OF THE SLIDING SCALE DOSE  o VICTOZA: DO NOT TAKE         [place] - Preparing for Surgery Before surgery, you can play an important role.  Because skin is not sterile, your skin needs to be as free of germs as possible.  You can reduce the number of germs on your skin by washing with CHG (chlorahexidine gluconate) soap before surgery.  CHG is an antiseptic  cleaner which kills germs and bonds with the skin to continue killing germs even after washing. Please DO NOT use if you have an allergy to CHG or antibacterial soaps.  If your skin becomes reddened/irritated stop using the CHG and inform your nurse when you arrive at Short Stay. Do not shave (including legs and underarms) for at least 48 hours prior to the first CHG shower.  You may shave your face/neck. Please follow these instructions carefully:  1.  Shower with CHG Soap the night before surgery and the  morning of Surgery.  2.  If you choose to wash your hair, wash your hair first as usual with your  normal  shampoo.  3.  After you shampoo, rinse your hair and body thoroughly to remove the  shampoo.                           4.  Use CHG as you would any other liquid soap.  You can apply chg directly  to the skin and wash                       Gently with a scrungie or clean washcloth.  5.  Apply the CHG Soap to  your body ONLY FROM THE NECK DOWN.   Do not use on face/ open                           Wound or open sores. Avoid contact with eyes, ears mouth and genitals (private parts).                       Wash face,  Genitals (private parts) with your normal soap.             6.  Wash thoroughly, paying special attention to the area where your surgery  will be performed.  7.  Thoroughly rinse your body with warm water from the neck down.  8.  DO NOT shower/wash with your normal soap after using and rinsing off  the CHG Soap.                9.  Pat yourself dry with a clean towel.            10.  Wear clean pajamas.            11.  Place clean sheets on your bed the night of your first shower and do not  sleep with pets. Day of Surgery : Do not apply any lotions/deodorants the morning of surgery.  Please wear clean clothes to the hospital/surgery center.  FAILURE TO FOLLOW THESE INSTRUCTIONS MAY RESULT IN THE CANCELLATION OF YOUR SURGERY PATIENT  SIGNATURE_________________________________  NURSE SIGNATURE__________________________________  ________________________________________________________________________    PAIN IS EXPECTED AFTER SURGERY AND WILL NOT BE COMPLETELY ELIMINATED. AMBULATION AND TYLENOL WILL HELP REDUCE INCISIONAL AND GAS PAIN. MOVEMENT IS KEY!  YOU ARE EXPECTED TO BE OUT OF BED WITHIN 4 HOURS OF ADMISSION TO YOUR PATIENT ROOM.  SITTING IN THE RECLINER THROUGHOUT THE DAY IS IMPORTANT FOR DRINKING FLUIDS AND MOVING GAS THROUGHOUT THE GI TRACT.  COMPRESSION STOCKINGS SHOULD BE WORN [place] UNLESS YOU ARE WALKING.   INCENTIVE SPIROMETER SHOULD BE USED EVERY HOUR WHILE AWAKE TO DECREASE POST-OPERATIVE COMPLICATIONS SUCH AS PNEUMONIA.  WHEN DISCHARGED HOME, IT IS IMPORTANT TO CONTINUE TO WALK EVERY HOUR AND USE THE INCENTIVE SPIROMETER EVERY HOUR.    Incentive Spirometer  An incentive spirometer is a tool that can help keep your lungs clear and active. This tool measures how well you are filling your lungs with each breath. Taking long deep breaths may help reverse or decrease the chance of developing breathing (pulmonary) problems (especially infection) following:  A long period of time when you are unable to move or be active. BEFORE THE PROCEDURE   If the spirometer includes an indicator to show your best effort, your nurse or respiratory therapist will set it to a desired goal.  If possible, sit up straight or lean slightly forward. Try not to slouch.  Hold the incentive spirometer in an upright position. INSTRUCTIONS FOR USE  1. Sit on the edge of your bed if possible, or sit up as far as you can in bed or on a chair. 2. Hold the incentive spirometer in an upright position. 3. Breathe out normally. 4. Place the mouthpiece in your mouth and seal your lips tightly around it. 5. Breathe in slowly and as deeply as possible, raising the piston or the ball toward the top of the  column. 6. Hold your breath for 3-5 seconds or for as long as possible. Allow the piston or ball to fall to the  bottom of the column. 7. Remove the mouthpiece from your mouth and breathe out normally. 8. Rest for a few seconds and repeat Steps 1 through 7 at least 10 times every 1-2 hours when you are awake. Take your time and take a few normal breaths between deep breaths. 9. The spirometer may include an indicator to show your best effort. Use the indicator as a goal to work toward during each repetition. 10. After each set of 10 deep breaths, practice coughing to be sure your lungs are clear. If you have an incision (the cut made at the time of surgery), support your incision when coughing by placing a pillow or rolled up towels firmly against it. Once you are able to get out of bed, walk around indoors and cough well. You may stop using the incentive spirometer when instructed by your caregiver.  RISKS AND COMPLICATIONS  Take your time so you do not get dizzy or light-headed.  If you are in pain, you may need to take or ask for pain medication before doing incentive spirometry. It is harder to take a deep breath if you are having pain. AFTER USE  Rest and breathe slowly and easily.  It can be helpful to keep track of a log of your progress. Your caregiver can provide you with a simple table to help with this. If you are using the spirometer at home, follow these instructions: SEEK MEDICAL CARE IF:   You are having difficultly using the spirometer.  You have trouble using the spirometer as often as instructed.  Your pain medication is not giving enough relief while using the spirometer.  You develop fever of 100.5 F (38.1 C) or higher. SEEK IMMEDIATE MEDICAL CARE IF:   You cough up bloody sputum that had not been present before.  You develop fever of 102 F (38.9 C) or greater.  You develop worsening pain at or near the incision site. MAKE SURE YOU:   Understand these  instructions.  Will watch your condition.  Will get help right away if you are not doing well or get worse. Document Released: 02/07/2007 Document Revised: 12/20/2011 Document Reviewed: 04/10/2007 [facility] Patient Information 2014 [place], [place].   ________________________________________________________________________

## 2019-09-13 ENCOUNTER — Encounter (HOSPITAL_COMMUNITY)
Admission: RE | Admit: 2019-09-13 | Discharge: 2019-09-13 | Disposition: A | Discharge status: Home / Self Care | Payer: 59 | Source: Ambulatory Visit | Attending: Surgery | Admitting: Surgery

## 2019-09-13 ENCOUNTER — Encounter (HOSPITAL_COMMUNITY): Payer: Self-pay

## 2019-09-13 ENCOUNTER — Other Ambulatory Visit: Payer: Self-pay

## 2019-09-13 ENCOUNTER — Inpatient Hospital Stay (HOSPITAL_COMMUNITY): Admission: RE | Admit: 2019-09-13 | Payer: 59 | Source: Ambulatory Visit

## 2019-09-13 DIAGNOSIS — Z79899 Other long term (current) drug therapy: Secondary | ICD-10-CM | POA: Insufficient documentation

## 2019-09-13 DIAGNOSIS — M1990 Unspecified osteoarthritis, unspecified site: Secondary | ICD-10-CM | POA: Diagnosis not present

## 2019-09-13 DIAGNOSIS — I10 Essential (primary) hypertension: Secondary | ICD-10-CM | POA: Diagnosis not present

## 2019-09-13 DIAGNOSIS — Z01812 Encounter for preprocedural laboratory examination: Secondary | ICD-10-CM | POA: Insufficient documentation

## 2019-09-13 DIAGNOSIS — Z9049 Acquired absence of other specified parts of digestive tract: Secondary | ICD-10-CM | POA: Insufficient documentation

## 2019-09-13 DIAGNOSIS — J449 Chronic obstructive pulmonary disease, unspecified: Secondary | ICD-10-CM | POA: Diagnosis not present

## 2019-09-13 DIAGNOSIS — F319 Bipolar disorder, unspecified: Secondary | ICD-10-CM | POA: Insufficient documentation

## 2019-09-13 DIAGNOSIS — Z794 Long term (current) use of insulin: Secondary | ICD-10-CM | POA: Insufficient documentation

## 2019-09-13 DIAGNOSIS — Z791 Long term (current) use of non-steroidal anti-inflammatories (NSAID): Secondary | ICD-10-CM | POA: Insufficient documentation

## 2019-09-13 DIAGNOSIS — E1142 Type 2 diabetes mellitus with diabetic polyneuropathy: Secondary | ICD-10-CM | POA: Diagnosis not present

## 2019-09-13 DIAGNOSIS — K219 Gastro-esophageal reflux disease without esophagitis: Secondary | ICD-10-CM | POA: Insufficient documentation

## 2019-09-13 DIAGNOSIS — F419 Anxiety disorder, unspecified: Secondary | ICD-10-CM | POA: Diagnosis not present

## 2019-09-13 DIAGNOSIS — Z6841 Body Mass Index (BMI) 40.0 and over, adult: Secondary | ICD-10-CM | POA: Insufficient documentation

## 2019-09-13 DIAGNOSIS — Z79890 Hormone replacement therapy (postmenopausal): Secondary | ICD-10-CM | POA: Insufficient documentation

## 2019-09-13 DIAGNOSIS — E039 Hypothyroidism, unspecified: Secondary | ICD-10-CM | POA: Diagnosis not present

## 2019-09-13 DIAGNOSIS — Z7982 Long term (current) use of aspirin: Secondary | ICD-10-CM | POA: Diagnosis not present

## 2019-09-13 HISTORY — DX: Essential (primary) hypertension: I10

## 2019-09-13 HISTORY — DX: Personal history of (healed) traumatic fracture: Z87.81

## 2019-09-13 HISTORY — DX: Asymptomatic varicose veins of bilateral lower extremities: I83.93

## 2019-09-13 HISTORY — DX: Hypothyroidism, unspecified: E03.9

## 2019-09-13 HISTORY — DX: Unspecified injury of head, initial encounter: S09.90XA

## 2019-09-13 HISTORY — DX: Polyneuropathy, unspecified: G62.9

## 2019-09-13 HISTORY — DX: Cellulitis, unspecified: L03.90

## 2019-09-13 HISTORY — DX: Dependence on supplemental oxygen: Z99.81

## 2019-09-13 HISTORY — DX: Other specified postprocedural states: Z98.890

## 2019-09-13 HISTORY — DX: Other specified postprocedural states: R11.2

## 2019-09-13 LAB — COMPREHENSIVE METABOLIC PANEL
ALT: 70 U/L — ABNORMAL HIGH (ref 0–44)
AST: 50 U/L — ABNORMAL HIGH (ref 15–41)
Albumin: 3.7 g/dL (ref 3.5–5.0)
Alkaline Phosphatase: 67 U/L (ref 38–126)
Anion gap: 12 (ref 5–15)
BUN: 25 mg/dL — ABNORMAL HIGH (ref 6–20)
CO2: 25 mmol/L (ref 22–32)
Calcium: 10.2 mg/dL (ref 8.9–10.3)
Chloride: 102 mmol/L (ref 98–111)
Creatinine, Ser: 1.32 mg/dL — ABNORMAL HIGH (ref 0.61–1.24)
GFR calc Af Amer: >60 mL/min (ref >60)
GFR calc non Af Amer: 60 mL/min — ABNORMAL LOW (ref >60)
Glucose, Bld: 290 mg/dL — ABNORMAL HIGH (ref 70–99)
Potassium: 4.1 mmol/L (ref 3.5–5.1)
Sodium: 139 mmol/L (ref 135–145)
Total Bilirubin: 0.6 mg/dL (ref 0.3–1.2)
Total Protein: 6.8 g/dL (ref 6.5–8.1)

## 2019-09-13 LAB — CBC WITH DIFFERENTIAL/PLATELET
Abs Immature Granulocytes: 0.04 10*3/uL (ref 0.00–0.07)
Basophils Absolute: 0.1 10*3/uL (ref 0.0–0.1)
Basophils Relative: 1 %
Eosinophils Absolute: 0.3 10*3/uL (ref 0.0–0.5)
Eosinophils Relative: 3 %
HCT: 45.6 % (ref 39.0–52.0)
Hemoglobin: 15 g/dL (ref 13.0–17.0)
Immature Granulocytes: 1 %
Lymphocytes Relative: 27 %
Lymphs Abs: 2.4 10*3/uL (ref 0.7–4.0)
MCH: 30.4 pg (ref 26.0–34.0)
MCHC: 32.9 g/dL (ref 30.0–36.0)
MCV: 92.5 fL (ref 80.0–100.0)
Monocytes Absolute: 1.2 10*3/uL — ABNORMAL HIGH (ref 0.1–1.0)
Monocytes Relative: 14 %
Neutro Abs: 4.7 10*3/uL (ref 1.7–7.7)
Neutrophils Relative %: 54 %
Platelets: 326 10*3/uL (ref 150–400)
RBC: 4.93 MIL/uL (ref 4.22–5.81)
RDW: 12.8 % (ref 11.5–15.5)
WBC: 8.8 10*3/uL (ref 4.0–10.5)
nRBC: 0 % (ref 0.0–0.2)

## 2019-09-13 LAB — HEMOGLOBIN A1C
Hgb A1c MFr Bld: 9.4 % — ABNORMAL HIGH (ref 4.8–5.6)
Mean Plasma Glucose: 223.08 mg/dL

## 2019-09-13 LAB — ABO/RH: ABO/RH(D): A POS

## 2019-09-13 LAB — GLUCOSE, CAPILLARY: Glucose-Capillary: 253 mg/dL — ABNORMAL HIGH (ref 70–99)

## 2019-09-13 NOTE — Patient Instructions (Signed)
DUE TO COVID-19 ONLY ONE VISITOR IS ALLOWED TO COME WITH YOU AND STAY IN THE WAITING ROOM ONLY DURING PRE OP AND PROCEDURE. THE ONE VISITOR MAY VISIT WITH YOU IN YOUR PRIVATE ROOM DURING VISITING HOURS ONLY!!              COVID SWAB TESTING MUST BE COMPLETED ON:  09/14/2019  [address] [place] [facility] enter pre surgical testing line (Must self quarantine after testing. Follow instructions on handout.)                         Your procedure is scheduled on: 09/18/2019              Report to [facility] Main  Entrance              Report to Short Stay at 5:30 AM                           Call this number if you have problems the morning of surgery [phone]   NO SOLID FOOD AFTER 6:00 PM THE NIGHT BEFORE YOUR SURGERY. YOU MAY DRINK CLEAR FLUIDS. MORNING OF SURGERY DRINK:   DRINK 1 G2 drink BEFORE YOU LEAVE HOME, DRINK ALL OF THE  G2 DRINK AT ONE TIME.   THE G2 DRINK YOU DRINK BEFORE YOU LEAVE HOME WILL BE THE LAST FLUIDS YOU DRINK BEFORE SURGERY.     CLEAR LIQUID DIET   Foods Allowed                                                                     Foods Excluded  Coffee and tea, regular and decaf                             liquids that you cannot  Plain Jell-O any favor except red or purple                                           see through such as: Fruit ices (not with fruit pulp)                                     milk, soups, orange juice  Iced Popsicles                                                              All solid food Carbonated beverages, regular and diet                                    Cranberry, grape and apple juices Sports drinks like Gatorade Lightly seasoned clear broth or consume(fat free)  Sugar, honey syrup  Sample Menu Breakfast                                Lunch                                     Supper Cranberry juice                    Beef broth                             Chicken broth Jell-O                                     Grape juice                           Apple juice Coffee or tea                        Jell-O                                      Popsicle                                                Coffee or tea                        Coffee or tea  _____________________________________________________________________                Brush your teeth the morning of surgery.              Do NOT smoke after Midnight              Take these medicines the morning of surgery with A SIP OF WATER: Wellbutrin, Neurontin, Synthroid, Prilosec, albuterol inhaler if needed (please bring), Symbicort inhaler                You may not have any metal on your body including hair pins, jewelry, and body piercing              Do not wear lotions, powders, perfumes/cologne, or deodorant                    Men may shave face and neck.              Do not bring valuables to the hospital. [place].              Contacts, dentures or bridgework may not be worn into surgery.              Bring small overnight bag day of surgery.              Special Instructions: Bring a copy of your healthcare power of  attorney and living will documents the day of surgery if you haven't scanned them in before.              HOW TO MANAGE YOUR DIABETES BEFORE AND AFTER SURGERY  Why is it important to control my blood sugar before and after surgery?  Improving blood sugar levels before and after surgery helps healing and can limit problems.  A way of improving blood sugar control is eating a healthy diet by: ?  Eating less sugar and carbohydrates ?  Increasing activity/exercise ?  Talking with your doctor about reaching your blood sugar goals  High blood sugars (greater than 180 mg/dL) can raise your risk of infections and slow your recovery, so you will need to focus on controlling your diabetes during the  weeks before surgery.  Make sure that the doctor who takes care of your diabetes knows about your planned surgery including the date and location.  How do I manage my blood sugar before surgery?  Check your blood sugar at least 4 times a day, starting 2 days before surgery, to make sure that the level is not too high or low. ? Check your blood sugar the morning of your surgery when you wake up and every 2 hours until you get to the Short Stay unit.  If your blood sugar is less than 70 mg/dL, you will need to treat for low blood sugar: ? Do not take insulin. ? Treat a low blood sugar (less than 70 mg/dL) with  cup of clear juice (cranberry or apple), 4glucose tablets, OR glucose gel. ? Recheck blood sugar in 15 minutes after treatment (to make sure it is greater than 70 mg/dL). If your blood sugar is not greater than 70 mg/dL on recheck, call [phone] for further instructions.  Report your blood sugar to the short stay nurse when you get to Short Stay.   If you are admitted to the hospital after surgery: ? Your blood sugar will be checked by the staff and you will probably be given insulin after surgery (instead of oral diabetes medicines) to make sure you have good blood sugar levels. ? The goal for blood sugar control after surgery is 80-180 mg/dL.   WHAT DO I DO ABOUT MY DIABETES MEDICATION:   THE DAY BEFORE SURGERY 12-7 ? METFORMIN: TAKE AS USUAL  ? TOUJEO: TAKE NORMAL MORNING DOSE. ONLY TAKE HALF OF BEDTIME DOSE ? HUMALOG INSULIN: TAKE NORMAL BREAKFAST AND LUNCH DOSE. DO NOT TAKE DINNER DOSE. ? VICTOZA: TAKE AS USUAL          THE MORNING OF SURGERY 12-8 ? METFROMIN: DO NOT TAKE ? TOUJEO: ONLY TAKE HALF OF MORNING DOSE ? HUMALOG INSULIN: IF YOUR BLOOD SUGAR IS HIGHER THAN 220 , YOU MAY TAKE HALF OF THE SLIDING SCALE DOSE  ? VICTOZA: DO NOT TAKE         [place] - Preparing for Surgery Before surgery, you can play an important role.  Because skin is not  sterile, your skin needs to be as free of germs as possible.  You can reduce the number of germs on your skin by washing with CHG (chlorahexidine gluconate) soap before surgery.  CHG is an antiseptic cleaner which kills germs and bonds with the skin to continue killing germs even after washing. Please DO NOT use if you have an allergy to CHG or antibacterial soaps.  If your skin becomes reddened/irritated stop using the CHG and inform your nurse when you arrive at  Short Stay. Do not shave (including legs and underarms) for at least 48 hours prior to the first CHG shower.  You may shave your face/neck. Please follow these instructions carefully:             1.  Shower with CHG Soap the night before surgery and the  morning of Surgery.             2.  If you choose to wash your hair, wash your hair first as usual with your  normal  shampoo.             3.  After you shampoo, rinse your hair and body thoroughly to remove the  shampoo.                               4.  Use CHG as you would any other liquid soap.  You can apply chg directly  to the skin and wash                       Gently with a scrungie or clean washcloth.             5.  Apply the CHG Soap to your body ONLY FROM THE NECK DOWN.   Do not use on face/ open                           Wound or open sores. Avoid contact with eyes, ears mouth and genitals (private parts).                       Wash face,  Genitals (private parts) with your normal soap.             6.  Wash thoroughly, paying special attention to the area where your surgery  will be performed.             7.  Thoroughly rinse your body with warm water from the neck down.             8.  DO NOT shower/wash with your normal soap after using and rinsing off  the CHG Soap.                9.  Pat yourself dry with a clean towel.            10.  Wear clean pajamas.            11.  Place clean sheets on your bed the night of your first shower and do not  sleep with pets. Day of  Surgery : Do not apply any lotions/deodorants the morning of surgery.  Please wear clean clothes to the hospital/surgery center.  FAILURE TO FOLLOW THESE INSTRUCTIONS MAY RESULT IN THE CANCELLATION OF YOUR SURGERY PATIENT SIGNATURE_________________________________  NURSE SIGNATURE__________________________________  ________________________________________________________________________    PAIN IS EXPECTED AFTER SURGERY AND WILL NOT BE COMPLETELY ELIMINATED. AMBULATION AND TYLENOL WILL HELP REDUCE INCISIONAL AND GAS PAIN. MOVEMENT IS KEY!  YOU ARE EXPECTED TO BE OUT OF BED WITHIN 4 HOURS OF ADMISSION TO YOUR PATIENT ROOM.  SITTING IN THE RECLINER THROUGHOUT THE DAY IS IMPORTANT FOR DRINKING FLUIDS AND MOVING GAS THROUGHOUT THE GI TRACT.  COMPRESSION STOCKINGS SHOULD BE WORN [facility] STAY UNLESS YOU ARE WALKING.   INCENTIVE SPIROMETER SHOULD BE USED EVERY HOUR WHILE AWAKE TO DECREASE  POST-OPERATIVE COMPLICATIONS SUCH AS PNEUMONIA.  WHEN DISCHARGED HOME, IT IS IMPORTANT TO CONTINUE TO WALK EVERY HOUR AND USE THE INCENTIVE SPIROMETER EVERY HOUR.    Incentive Spirometer  An incentive spirometer is a tool that can help keep your lungs clear and active. This tool measures how well you are filling your lungs with each breath. Taking long deep breaths may help reverse or decrease the chance of developing breathing (pulmonary) problems (especially infection) following:  A long period of time when you are unable to move or be active. BEFORE THE PROCEDURE   If the spirometer includes an indicator to show your best effort, your nurse or respiratory therapist will set it to a desired goal.  If possible, sit up straight or lean slightly forward. Try not to slouch.  Hold the incentive spirometer in an upright position. INSTRUCTIONS FOR USE  1. Sit on the edge of your bed if possible, or sit up as far as you can in bed or on a chair. 2. Hold the incentive  spirometer in an upright position. 3. Breathe out normally. 4. Place the mouthpiece in your mouth and seal your lips tightly around it. 5. Breathe in slowly and as deeply as possible, raising the piston or the ball toward the top of the column. 6. Hold your breath for 3-5 seconds or for as long as possible. Allow the piston or ball to fall to the bottom of the column. 7. Remove the mouthpiece from your mouth and breathe out normally. 8. Rest for a few seconds and repeat Steps 1 through 7 at least 10 times every 1-2 hours when you are awake. Take your time and take a few normal breaths between deep breaths. 9. The spirometer may include an indicator to show your best effort. Use the indicator as a goal to work toward during each repetition. 10. After each set of 10 deep breaths, practice coughing to be sure your lungs are clear. If you have an incision (the cut made at the time of surgery), support your incision when coughing by placing a pillow or rolled up towels firmly against it. Once you are able to get out of bed, walk around indoors and cough well. You may stop using the incentive spirometer when instructed by your caregiver.  RISKS AND COMPLICATIONS  Take your time so you do not get dizzy or light-headed.  If you are in pain, you may need to take or ask for pain medication before doing incentive spirometry. It is harder to take a deep breath if you are having pain. AFTER USE  Rest and breathe slowly and easily.  It can be helpful to keep track of a log of your progress. Your caregiver can provide you with a simple table to help with this. If you are using the spirometer at home, follow these instructions: SEEK MEDICAL CARE IF:   You are having difficultly using the spirometer.  You have trouble using the spirometer as often as instructed.  Your pain medication is not giving enough relief while using the spirometer.  You develop fever of 100.5 F (38.1 C) or higher. SEEK  IMMEDIATE MEDICAL CARE IF:   You cough up bloody sputum that had not been present before.  You develop fever of 102 F (38.9 C) or greater.  You develop worsening pain at or near the incision site. MAKE SURE YOU:   Understand these instructions.  Will watch your condition.  Will get help right away if you are not doing well  or get worse. Document Released: 02/07/2007 Document Revised: 12/20/2011 Document Reviewed: 04/10/2007 [employer] Patient Information 2014 [place].   ________________________________________________________________________          Electronically signed by Vida Roller, RN at 09/11/2019 3:49 PM   Pre-Admission Testing 60 on 09/13/2019     Detailed Report     Note shared with patient

## 2019-09-13 NOTE — Progress Notes (Addendum)
PCP - [place] Cardiologist - N/A  Chest x-ray - 11/21/2018 in epic EKG - 10/27/2018 in epic Stress Test - greater than 2 years ago ECHO - unsure Cardiac Cath - N/A  Sleep Study - Yes CPAP - NO OSA/CPAP  Fasting Blood Sugar - 140-220 Checks Blood Sugar ___4__ times a day  Blood Thinner Instructions: N/A Aspirin Instructions: Last Dose: Has not used in over 2 months  Anesthesia review: Hgb A1C 9.4 on 09/13/2019, COPD uses Oxygen at home, BMI 52.11  Patient denies shortness of breath, fever, cough and chest pain at PAT appointment   Patient verbalized understanding of instructions that were given to them at the PAT appointment. Patient was also instructed that they will need to review over the PAT instructions again at home before surgery.

## 2019-09-14 ENCOUNTER — Encounter (HOSPITAL_COMMUNITY): Payer: Self-pay

## 2019-09-14 ENCOUNTER — Other Ambulatory Visit (HOSPITAL_COMMUNITY): Payer: 59

## 2019-09-14 ENCOUNTER — Inpatient Hospital Stay (HOSPITAL_COMMUNITY): Admission: RE | Admit: 2019-09-14 | Payer: 59 | Source: Ambulatory Visit
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
## 2019-09-17 ENCOUNTER — Other Ambulatory Visit (HOSPITAL_COMMUNITY): Payer: 59

## 2019-09-17 ENCOUNTER — Other Ambulatory Visit (HOSPITAL_COMMUNITY)
Admission: RE | Admit: 2019-09-17 | Discharge: 2019-09-17 | Disposition: A | Discharge status: Home / Self Care | Payer: 59 | Source: Ambulatory Visit | Attending: Surgery | Admitting: Surgery

## 2019-09-17 LAB — SARS CORONAVIRUS 2 (TAT 6-24 HRS): SARS Coronavirus 2: NEGATIVE
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
## 2019-09-18 ENCOUNTER — Encounter (HOSPITAL_COMMUNITY): Payer: Self-pay | Admitting: Emergency Medicine

## 2019-09-18 ENCOUNTER — Inpatient Hospital Stay (HOSPITAL_COMMUNITY): Payer: 59 | Admitting: Physician Assistant

## 2019-09-18 ENCOUNTER — Encounter (HOSPITAL_COMMUNITY)
Admission: RE | Disposition: A | Discharge status: Home / Self Care | Payer: Self-pay | Source: Ambulatory Visit | Attending: Surgery

## 2019-09-18 ENCOUNTER — Inpatient Hospital Stay (HOSPITAL_COMMUNITY)
Admission: RE | Admit: 2019-09-18 | Discharge: 2019-09-19 | DRG: 620 | Disposition: A | Discharge status: Home / Self Care | Payer: 59 | Source: Ambulatory Visit | Attending: Surgery | Admitting: Surgery

## 2019-09-18 ENCOUNTER — Other Ambulatory Visit: Payer: Self-pay

## 2019-09-18 ENCOUNTER — Inpatient Hospital Stay (HOSPITAL_COMMUNITY): Payer: 59 | Admitting: Certified Registered Nurse Anesthetist

## 2019-09-18 DIAGNOSIS — I10 Essential (primary) hypertension: Secondary | ICD-10-CM | POA: Diagnosis present

## 2019-09-18 DIAGNOSIS — Z9049 Acquired absence of other specified parts of digestive tract: Secondary | ICD-10-CM | POA: Diagnosis not present

## 2019-09-18 DIAGNOSIS — Z818 Family history of other mental and behavioral disorders: Secondary | ICD-10-CM

## 2019-09-18 DIAGNOSIS — Z6841 Body Mass Index (BMI) 40.0 and over, adult: Secondary | ICD-10-CM

## 2019-09-18 DIAGNOSIS — Z87820 Personal history of traumatic brain injury: Secondary | ICD-10-CM

## 2019-09-18 DIAGNOSIS — F319 Bipolar disorder, unspecified: Secondary | ICD-10-CM | POA: Diagnosis present

## 2019-09-18 DIAGNOSIS — F419 Anxiety disorder, unspecified: Secondary | ICD-10-CM | POA: Diagnosis present

## 2019-09-18 DIAGNOSIS — G4733 Obstructive sleep apnea (adult) (pediatric): Secondary | ICD-10-CM | POA: Diagnosis present

## 2019-09-18 DIAGNOSIS — Z7951 Long term (current) use of inhaled steroids: Secondary | ICD-10-CM | POA: Diagnosis not present

## 2019-09-18 DIAGNOSIS — Z79899 Other long term (current) drug therapy: Secondary | ICD-10-CM | POA: Diagnosis not present

## 2019-09-18 DIAGNOSIS — Z8249 Family history of ischemic heart disease and other diseases of the circulatory system: Secondary | ICD-10-CM | POA: Diagnosis not present

## 2019-09-18 DIAGNOSIS — N401 Enlarged prostate with lower urinary tract symptoms: Secondary | ICD-10-CM | POA: Diagnosis present

## 2019-09-18 DIAGNOSIS — E8881 Metabolic syndrome: Secondary | ICD-10-CM | POA: Diagnosis present

## 2019-09-18 DIAGNOSIS — J449 Chronic obstructive pulmonary disease, unspecified: Secondary | ICD-10-CM | POA: Diagnosis present

## 2019-09-18 DIAGNOSIS — Z7982 Long term (current) use of aspirin: Secondary | ICD-10-CM

## 2019-09-18 DIAGNOSIS — K219 Gastro-esophageal reflux disease without esophagitis: Secondary | ICD-10-CM | POA: Diagnosis present

## 2019-09-18 DIAGNOSIS — J9612 Chronic respiratory failure with hypercapnia: Secondary | ICD-10-CM | POA: Diagnosis present

## 2019-09-18 DIAGNOSIS — E039 Hypothyroidism, unspecified: Secondary | ICD-10-CM | POA: Diagnosis present

## 2019-09-18 DIAGNOSIS — Z20828 Contact with and (suspected) exposure to other viral communicable diseases: Secondary | ICD-10-CM | POA: Diagnosis present

## 2019-09-18 DIAGNOSIS — Z9981 Dependence on supplemental oxygen: Secondary | ICD-10-CM

## 2019-09-18 DIAGNOSIS — Z833 Family history of diabetes mellitus: Secondary | ICD-10-CM | POA: Diagnosis not present

## 2019-09-18 DIAGNOSIS — K7581 Nonalcoholic steatohepatitis (NASH): Secondary | ICD-10-CM | POA: Diagnosis present

## 2019-09-18 DIAGNOSIS — Z79890 Hormone replacement therapy (postmenopausal): Secondary | ICD-10-CM | POA: Diagnosis not present

## 2019-09-18 DIAGNOSIS — E1143 Type 2 diabetes mellitus with diabetic autonomic (poly)neuropathy: Secondary | ICD-10-CM | POA: Diagnosis present

## 2019-09-18 DIAGNOSIS — Z794 Long term (current) use of insulin: Secondary | ICD-10-CM

## 2019-09-18 DIAGNOSIS — Z7289 Other problems related to lifestyle: Secondary | ICD-10-CM

## 2019-09-18 DIAGNOSIS — Z9884 Bariatric surgery status: Secondary | ICD-10-CM

## 2019-09-18 HISTORY — PX: LAPAROSCOPIC GASTRIC SLEEVE RESECTION: SHX5895

## 2019-09-18 LAB — CREATININE, SERUM
Creatinine, Ser: 1.46 mg/dL — ABNORMAL HIGH (ref 0.61–1.24)
GFR calc Af Amer: >60 mL/min (ref >60)
GFR calc non Af Amer: 53 mL/min — ABNORMAL LOW (ref >60)

## 2019-09-18 LAB — TYPE AND SCREEN
ABO/RH(D): A POS
Antibody Screen: NEGATIVE

## 2019-09-18 LAB — CBC
HCT: 44.1 % (ref 39.0–52.0)
Hemoglobin: 14.2 g/dL (ref 13.0–17.0)
MCH: 30.7 pg (ref 26.0–34.0)
MCHC: 32.2 g/dL (ref 30.0–36.0)
MCV: 95.5 fL (ref 80.0–100.0)
Platelets: 273 10*3/uL (ref 150–400)
RBC: 4.62 MIL/uL (ref 4.22–5.81)
RDW: 13.1 % (ref 11.5–15.5)
WBC: 9.5 10*3/uL (ref 4.0–10.5)
nRBC: 0 % (ref 0.0–0.2)

## 2019-09-18 LAB — GLUCOSE, CAPILLARY
Glucose-Capillary: 131 mg/dL — ABNORMAL HIGH (ref 70–99)
Glucose-Capillary: 229 mg/dL — ABNORMAL HIGH (ref 70–99)

## 2019-09-18 SURGERY — GASTRECTOMY, SLEEVE, LAPAROSCOPIC
Anesthesia: General

## 2019-09-18 MED ORDER — SODIUM CHLORIDE 0.9 % IR SOLN
Status: DC | PRN
  Administered 2019-09-18: 1000 mL

## 2019-09-18 MED ORDER — ONDANSETRON HCL 4 MG/2ML IJ SOLN
INTRAMUSCULAR | Status: DC | PRN
  Administered 2019-09-18 (×2): 4 mg via INTRAVENOUS

## 2019-09-18 MED ORDER — ACETAMINOPHEN 160 MG/5ML PO SOLN: 1000.0000 mg | Freq: Three times a day (TID) | ORAL | Status: DC

## 2019-09-18 MED ORDER — SODIUM CHLORIDE (PF) 0.9 % IJ SOLN
INTRAMUSCULAR | Status: AC
  Filled 2019-09-18: qty 10

## 2019-09-18 MED ORDER — KETAMINE HCL 10 MG/ML IJ SOLN
INTRAMUSCULAR | Status: AC
  Filled 2019-09-18: qty 1

## 2019-09-18 MED ORDER — HEPARIN SODIUM (PORCINE) 5000 UNIT/ML IJ SOLN
5000.0000 [IU] | INTRAMUSCULAR | Status: AC
  Administered 2019-09-18: 5000 [IU] via SUBCUTANEOUS
  Filled 2019-09-18: qty 1

## 2019-09-18 MED ORDER — ROCURONIUM BROMIDE 10 MG/ML (PF) SYRINGE
PREFILLED_SYRINGE | INTRAVENOUS | Status: DC | PRN
  Administered 2019-09-18: 30 mg via INTRAVENOUS
  Administered 2019-09-18: 70 mg via INTRAVENOUS
  Administered 2019-09-18: 40 mg via INTRAVENOUS
  Administered 2019-09-18: 30 mg via INTRAVENOUS

## 2019-09-18 MED ORDER — PHENYLEPHRINE HCL-NACL 10-0.9 MG/250ML-% IV SOLN
INTRAVENOUS | Status: DC | PRN
  Administered 2019-09-18: 25 ug/min via INTRAVENOUS

## 2019-09-18 MED ORDER — SODIUM CHLORIDE (PF) 0.9 % IJ SOLN
INTRAMUSCULAR | Status: DC | PRN
  Administered 2019-09-18: 10 mL via INTRAVENOUS

## 2019-09-18 MED ORDER — BUPIVACAINE LIPOSOME 1.3 % IJ SUSP
20.0000 mL | Freq: Once | INTRAMUSCULAR | Status: AC
  Administered 2019-09-18: 20 mL
  Filled 2019-09-18: qty 20

## 2019-09-18 MED ORDER — INSULIN GLARGINE 100 UNIT/ML ~~LOC~~ SOLN
10.0000 [IU] | Freq: Every day | SUBCUTANEOUS | Status: DC
  Administered 2019-09-18: 10 [IU] via SUBCUTANEOUS
  Filled 2019-09-18 (×2): qty 0.1

## 2019-09-18 MED ORDER — OXYCODONE HCL 5 MG PO TABS: 5.0000 mg | ORAL_TABLET | Freq: Once | ORAL | Status: DC | PRN

## 2019-09-18 MED ORDER — ENOXAPARIN (LOVENOX) PATIENT EDUCATION KIT
1.0000 | PACK | Freq: Once | Status: DC
  Filled 2019-09-18: qty 1

## 2019-09-18 MED ORDER — GABAPENTIN 300 MG PO CAPS
300.0000 mg | ORAL_CAPSULE | ORAL | Status: DC
  Filled 2019-09-18: qty 1

## 2019-09-18 MED ORDER — FENTANYL CITRATE (PF) 250 MCG/5ML IJ SOLN
INTRAMUSCULAR | Status: DC | PRN
  Administered 2019-09-18: 100 ug via INTRAVENOUS

## 2019-09-18 MED ORDER — FENTANYL CITRATE (PF) 100 MCG/2ML IJ SOLN
INTRAMUSCULAR | Status: AC
  Filled 2019-09-18: qty 2

## 2019-09-18 MED ORDER — ACETAMINOPHEN 500 MG PO TABS
1000.0000 mg | ORAL_TABLET | Freq: Three times a day (TID) | ORAL | Status: DC
  Administered 2019-09-18 – 2019-09-19 (×3): 1000 mg via ORAL
  Filled 2019-09-18 (×3): qty 2

## 2019-09-18 MED ORDER — PROPOFOL 10 MG/ML IV BOLUS
INTRAVENOUS | Status: AC
  Filled 2019-09-18: qty 20

## 2019-09-18 MED ORDER — OXYCODONE HCL 5 MG/5ML PO SOLN
5.0000 mg | Freq: Four times a day (QID) | ORAL | Status: DC | PRN
  Administered 2019-09-18 – 2019-09-19 (×2): 5 mg via ORAL
  Filled 2019-09-18 (×2): qty 5

## 2019-09-18 MED ORDER — FUROSEMIDE 40 MG PO TABS
40.0000 mg | ORAL_TABLET | Freq: Every day | ORAL | Status: DC
  Administered 2019-09-19: 40 mg via ORAL
  Filled 2019-09-18: qty 1

## 2019-09-18 MED ORDER — HYDROMORPHONE HCL 1 MG/ML IJ SOLN
INTRAMUSCULAR | Status: AC
  Filled 2019-09-18: qty 2

## 2019-09-18 MED ORDER — CITALOPRAM HYDROBROMIDE 20 MG PO TABS
40.0000 mg | ORAL_TABLET | Freq: Every day | ORAL | Status: DC
  Administered 2019-09-19: 40 mg via ORAL
  Filled 2019-09-18: qty 2

## 2019-09-18 MED ORDER — ZIPRASIDONE HCL 20 MG PO CAPS
20.0000 mg | ORAL_CAPSULE | Freq: Two times a day (BID) | ORAL | Status: DC
  Filled 2019-09-18 (×2): qty 1

## 2019-09-18 MED ORDER — PROPOFOL 10 MG/ML IV BOLUS
INTRAVENOUS | Status: DC | PRN
  Administered 2019-09-18: 250 mg via INTRAVENOUS

## 2019-09-18 MED ORDER — ACETAMINOPHEN 500 MG PO TABS
1000.0000 mg | ORAL_TABLET | ORAL | Status: AC
  Administered 2019-09-18: 1000 mg via ORAL
  Filled 2019-09-18: qty 2

## 2019-09-18 MED ORDER — HYDROMORPHONE HCL 1 MG/ML IJ SOLN: 0.2500 mg | INTRAMUSCULAR | Status: DC | PRN

## 2019-09-18 MED ORDER — PHENYLEPHRINE HCL (PRESSORS) 10 MG/ML IV SOLN
INTRAVENOUS | Status: AC
  Filled 2019-09-18: qty 1

## 2019-09-18 MED ORDER — PROMETHAZINE HCL 25 MG/ML IJ SOLN: 6.2500 mg | INTRAMUSCULAR | Status: DC | PRN

## 2019-09-18 MED ORDER — LISINOPRIL 20 MG PO TABS
40.0000 mg | ORAL_TABLET | Freq: Every day | ORAL | Status: DC
  Administered 2019-09-18 – 2019-09-19 (×2): 40 mg via ORAL
  Filled 2019-09-18 (×2): qty 2

## 2019-09-18 MED ORDER — SUCCINYLCHOLINE CHLORIDE 200 MG/10ML IV SOSY
PREFILLED_SYRINGE | INTRAVENOUS | Status: DC | PRN
  Administered 2019-09-18: 200 mg via INTRAVENOUS

## 2019-09-18 MED ORDER — APREPITANT 40 MG PO CAPS
40.0000 mg | ORAL_CAPSULE | ORAL | Status: AC
  Administered 2019-09-18: 40 mg via ORAL
  Filled 2019-09-18: qty 1

## 2019-09-18 MED ORDER — KETAMINE HCL 10 MG/ML IJ SOLN
INTRAMUSCULAR | Status: DC | PRN
  Administered 2019-09-18: 40 mg via INTRAVENOUS
  Administered 2019-09-18: 10 mg via INTRAVENOUS

## 2019-09-18 MED ORDER — MORPHINE SULFATE (PF) 2 MG/ML IV SOLN: 1.0000 mg | INTRAVENOUS | Status: DC | PRN

## 2019-09-18 MED ORDER — SUGAMMADEX SODIUM 200 MG/2ML IV SOLN
INTRAVENOUS | Status: DC | PRN
  Administered 2019-09-18: 500 mg via INTRAVENOUS

## 2019-09-18 MED ORDER — LISINOPRIL 20 MG PO TABS: 40.0000 mg | ORAL_TABLET | Freq: Every day | ORAL | Status: DC

## 2019-09-18 MED ORDER — KCL IN DEXTROSE-NACL 20-5-0.45 MEQ/L-%-% IV SOLN
INTRAVENOUS | Status: DC
  Administered 2019-09-18: 17:00:00 via INTRAVENOUS
  Filled 2019-09-18 (×2): qty 1000

## 2019-09-18 MED ORDER — FLUTICASONE PROPIONATE 50 MCG/ACT NA SUSP: 2.0000 | Freq: Every day | NASAL | Status: DC | PRN

## 2019-09-18 MED ORDER — SODIUM CHLORIDE 0.9 % IV SOLN
2.0000 g | INTRAVENOUS | Status: AC
  Administered 2019-09-18: 2 g via INTRAVENOUS
  Filled 2019-09-18: qty 2

## 2019-09-18 MED ORDER — DEXAMETHASONE SODIUM PHOSPHATE 10 MG/ML IJ SOLN
INTRAMUSCULAR | Status: DC | PRN
  Administered 2019-09-18: 10 mg via INTRAVENOUS

## 2019-09-18 MED ORDER — MIDAZOLAM HCL 2 MG/2ML IJ SOLN
INTRAMUSCULAR | Status: AC
  Filled 2019-09-18: qty 2

## 2019-09-18 MED ORDER — LIDOCAINE HCL 2 % IJ SOLN
INTRAMUSCULAR | Status: AC
  Filled 2019-09-18: qty 20

## 2019-09-18 MED ORDER — PANTOPRAZOLE SODIUM 40 MG IV SOLR
40.0000 mg | Freq: Every day | INTRAVENOUS | Status: DC
  Administered 2019-09-18: 40 mg via INTRAVENOUS
  Filled 2019-09-18: qty 40

## 2019-09-18 MED ORDER — LACTATED RINGERS IV SOLN
INTRAVENOUS | Status: DC
  Administered 2019-09-18 (×2): via INTRAVENOUS

## 2019-09-18 MED ORDER — MOMETASONE FURO-FORMOTEROL FUM 200-5 MCG/ACT IN AERO
2.0000 | INHALATION_SPRAY | Freq: Two times a day (BID) | RESPIRATORY_TRACT | Status: DC
  Administered 2019-09-18 – 2019-09-19 (×2): 2 via RESPIRATORY_TRACT
  Filled 2019-09-18: qty 8.8

## 2019-09-18 MED ORDER — ONDANSETRON HCL 4 MG/2ML IJ SOLN: 4.0000 mg | INTRAMUSCULAR | Status: DC | PRN

## 2019-09-18 MED ORDER — ALBUTEROL SULFATE (2.5 MG/3ML) 0.083% IN NEBU: 2.5000 mg | INHALATION_SOLUTION | Freq: Four times a day (QID) | RESPIRATORY_TRACT | Status: DC | PRN

## 2019-09-18 MED ORDER — HEPARIN SODIUM (PORCINE) 5000 UNIT/ML IJ SOLN
5000.0000 [IU] | Freq: Three times a day (TID) | INTRAMUSCULAR | Status: DC
  Administered 2019-09-18 – 2019-09-19 (×3): 5000 [IU] via SUBCUTANEOUS
  Filled 2019-09-18 (×3): qty 1

## 2019-09-18 MED ORDER — LIDOCAINE 2% (20 MG/ML) 5 ML SYRINGE
INTRAMUSCULAR | Status: DC | PRN
  Administered 2019-09-18: 1 mg/kg/h via INTRAVENOUS

## 2019-09-18 MED ORDER — BUPROPION HCL ER (SR) 100 MG PO TB12
200.0000 mg | ORAL_TABLET | Freq: Two times a day (BID) | ORAL | Status: DC
  Administered 2019-09-18 – 2019-09-19 (×2): 200 mg via ORAL
  Filled 2019-09-18 (×3): qty 2

## 2019-09-18 MED ORDER — OXYCODONE HCL 5 MG/5ML PO SOLN: 5.0000 mg | Freq: Once | ORAL | Status: DC | PRN

## 2019-09-18 MED ORDER — ENSURE MAX PROTEIN PO LIQD
2.0000 [oz_av] | ORAL | Status: DC
  Administered 2019-09-19 (×4): 2 [oz_av] via ORAL
  Filled 2019-09-18 (×8): qty 330

## 2019-09-18 MED ORDER — LACTATED RINGERS IR SOLN
Status: DC | PRN
  Administered 2019-09-18: 1

## 2019-09-18 MED ORDER — CHLORHEXIDINE GLUCONATE CLOTH 2 % EX PADS: 6.0000 | MEDICATED_PAD | Freq: Once | CUTANEOUS | Status: DC

## 2019-09-18 MED ORDER — SUGAMMADEX SODIUM 500 MG/5ML IV SOLN
INTRAVENOUS | Status: AC
  Filled 2019-09-18: qty 5

## 2019-09-18 MED ORDER — MIDAZOLAM HCL 5 MG/5ML IJ SOLN
INTRAMUSCULAR | Status: DC | PRN
  Administered 2019-09-18: 2 mg via INTRAVENOUS

## 2019-09-18 MED ORDER — MEPERIDINE HCL 50 MG/ML IJ SOLN: 6.2500 mg | INTRAMUSCULAR | Status: DC | PRN

## 2019-09-18 MED ORDER — LIDOCAINE 2% (20 MG/ML) 5 ML SYRINGE
INTRAMUSCULAR | Status: DC | PRN
  Administered 2019-09-18: 80 mg via INTRAVENOUS

## 2019-09-18 MED ORDER — INSULIN ASPART 100 UNIT/ML ~~LOC~~ SOLN
0.0000 [IU] | SUBCUTANEOUS | Status: DC
  Administered 2019-09-18 (×2): 11 [IU] via SUBCUTANEOUS
  Administered 2019-09-19: 7 [IU] via SUBCUTANEOUS
  Administered 2019-09-19 (×2): 4 [IU] via SUBCUTANEOUS
  Administered 2019-09-19: 7 [IU] via SUBCUTANEOUS

## 2019-09-18 MED ORDER — METOPROLOL TARTRATE 5 MG/5ML IV SOLN
5.0000 mg | Freq: Four times a day (QID) | INTRAVENOUS | Status: DC | PRN
  Administered 2019-09-18: 5 mg via INTRAVENOUS
  Filled 2019-09-18 (×2): qty 5

## 2019-09-18 SURGICAL SUPPLY — 65 items
APPLICATOR COTTON TIP 6 STRL (MISCELLANEOUS) IMPLANT
APPLICATOR COTTON TIP 6IN STRL (MISCELLANEOUS)
APPLIER CLIP 5 13 M/L LIGAMAX5 (MISCELLANEOUS)
APPLIER CLIP ROT 10 11.4 M/L (STAPLE)
APPLIER CLIP ROT 13.4 12 LRG (CLIP)
BAG LAPAROSCOPIC 12 15 PORT 16 (BASKET) ×1 IMPLANT
BAG RETRIEVAL 12/15 (BASKET) ×2
BLADE SURG 15 STRL LF DISP TIS (BLADE) ×1 IMPLANT
BLADE SURG 15 STRL SS (BLADE) ×1
CABLE HIGH FREQUENCY MONO STRZ (ELECTRODE) ×2 IMPLANT
CLIP APPLIE 5 13 M/L LIGAMAX5 (MISCELLANEOUS) IMPLANT
CLIP APPLIE ROT 10 11.4 M/L (STAPLE) IMPLANT
CLIP APPLIE ROT 13.4 12 LRG (CLIP) IMPLANT
COVER WAND RF STERILE (DRAPES) IMPLANT
DERMABOND ADVANCED (GAUZE/BANDAGES/DRESSINGS) ×1
DERMABOND ADVANCED .7 DNX12 (GAUZE/BANDAGES/DRESSINGS) ×1 IMPLANT
DEVICE SUT QUICK LOAD TK 5 (STAPLE) IMPLANT
DEVICE SUT TI-KNOT TK 5X26 (MISCELLANEOUS) IMPLANT
DEVICE SUTURE ENDOST 10MM (ENDOMECHANICALS) IMPLANT
DISSECTOR BLUNT TIP ENDO 5MM (MISCELLANEOUS) IMPLANT
DRSG TEGADERM 2-3/8X2-3/4 SM (GAUZE/BANDAGES/DRESSINGS) ×12 IMPLANT
DRSG TEGADERM 4X4.75 (GAUZE/BANDAGES/DRESSINGS) ×4 IMPLANT
ELECT REM PT RETURN 15FT ADLT (MISCELLANEOUS) ×2 IMPLANT
GAUZE SPONGE 4X4 12PLY STRL (GAUZE/BANDAGES/DRESSINGS) IMPLANT
GLOVE BIOGEL M 8.0 STRL (GLOVE) ×2 IMPLANT
GOWN STRL REUS W/TWL XL LVL3 (GOWN DISPOSABLE) ×8 IMPLANT
GRASPER SUT TROCAR 14GX15 (MISCELLANEOUS) ×2 IMPLANT
HANDLE STAPLE EGIA 4 XL (STAPLE) ×2 IMPLANT
HOVERMATT SINGLE USE (MISCELLANEOUS) ×2 IMPLANT
KIT BASIN OR (CUSTOM PROCEDURE TRAY) ×2 IMPLANT
KIT TURNOVER KIT A (KITS) IMPLANT
MARKER SKIN DUAL TIP RULER LAB (MISCELLANEOUS) ×2 IMPLANT
NEEDLE SPNL 22GX3.5 QUINCKE BK (NEEDLE) ×2 IMPLANT
PACK UNIVERSAL I (CUSTOM PROCEDURE TRAY) ×2 IMPLANT
PENCIL SMOKE EVACUATOR (MISCELLANEOUS) IMPLANT
RELOAD TRI 45 ART MED THCK BLK (STAPLE) ×4 IMPLANT
RELOAD TRI 45 ART MED THCK PUR (STAPLE) IMPLANT
RELOAD TRI 60 ART MED THCK BLK (STAPLE) ×8 IMPLANT
RELOAD TRI 60 ART MED THCK PUR (STAPLE) IMPLANT
SCISSORS LAP 5X45 EPIX DISP (ENDOMECHANICALS) IMPLANT
SET IRRIG TUBING LAPAROSCOPIC (IRRIGATION / IRRIGATOR) ×2 IMPLANT
SET TUBE SMOKE EVAC HIGH FLOW (TUBING) ×2 IMPLANT
SHEARS HARMONIC ACE PLUS 45CM (MISCELLANEOUS) ×2 IMPLANT
SLEEVE ADV FIXATION 5X100MM (TROCAR) ×6 IMPLANT
SLEEVE GASTRECTOMY 36FR VISIGI (MISCELLANEOUS) ×2 IMPLANT
SOL ANTI FOG 6CC (MISCELLANEOUS) ×1 IMPLANT
SOLUTION ANTI FOG 6CC (MISCELLANEOUS) ×1
SPONGE LAP 18X18 RF (DISPOSABLE) ×2 IMPLANT
STAPLER VISISTAT 35W (STAPLE) ×4 IMPLANT
SUT MNCRL AB 4-0 PS2 18 (SUTURE) ×8 IMPLANT
SUT SURGIDAC NAB ES-9 0 48 120 (SUTURE) IMPLANT
SUT VICRYL 0 TIES 12 18 (SUTURE) ×2 IMPLANT
SYR 10ML ECCENTRIC (SYRINGE) ×2 IMPLANT
SYR 20ML LL LF (SYRINGE) ×2 IMPLANT
SYR 50ML LL SCALE MARK (SYRINGE) ×2 IMPLANT
TOWEL OR 17X26 10 PK STRL BLUE (TOWEL DISPOSABLE) ×4 IMPLANT
TOWEL OR NON WOVEN STRL DISP B (DISPOSABLE) ×2 IMPLANT
TRAY FOLEY MTR SLVR 16FR STAT (SET/KITS/TRAYS/PACK) IMPLANT
TROCAR ADV FIXATION 11X100MM (TROCAR) ×6 IMPLANT
TROCAR ADV FIXATION 5X100MM (TROCAR) ×2 IMPLANT
TROCAR BLADELESS 15MM (ENDOMECHANICALS) ×2 IMPLANT
TROCAR BLADELESS OPT 5 100 (ENDOMECHANICALS) ×2 IMPLANT
TUBE CALIBRATION LAPBAND (TUBING) IMPLANT
TUBING CONNECTING 10 (TUBING) ×2 IMPLANT
TUBING ENDO SMARTCAP (MISCELLANEOUS) ×2 IMPLANT

## 2019-09-18 NOTE — Anesthesia Procedure Notes (Signed)
Procedure Name: Intubation Date/Time: 09/18/2019 7:45 AM Performed by: Mitzie Na, CRNA Pre-anesthesia Checklist: Patient identified, Emergency Drugs available, Suction available and Patient being monitored Patient Re-evaluated:Patient Re-evaluated prior to induction Oxygen Delivery Method: Circle system utilized Preoxygenation: Pre-oxygenation with 100% oxygen Induction Type: IV induction and Rapid sequence Laryngoscope Size: Mac and 4 Grade View: Grade I Tube type: Oral Tube size: 7.5 mm Number of attempts: 1 Airway Equipment and Method: Stylet and Oral airway Placement Confirmation: ETT inserted through vocal cords under direct vision,  positive ETCO2 and breath sounds checked- equal and bilateral Secured at: 24 cm Tube secured with: Tape Dental Injury: Teeth and Oropharynx as per pre-operative assessment
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
## 2019-09-18 NOTE — Progress Notes (Signed)
BP of 203/85, Lopressor given, recheck of BP 216/94, called to DR. Kinsinger, orders received.

## 2019-09-18 NOTE — Anesthesia Preprocedure Evaluation (Addendum)
Anesthesia Evaluation  Patient identified by MRN, date of birth, ID band Patient awake    Reviewed: Allergy & Precautions, NPO status , Patient's Chart, lab work & pertinent test results  History of Anesthesia Complications (+) PONV  Airway Mallampati: II  TM Distance: >3 FB Neck ROM: Full    Dental  (+) Edentulous Upper, Edentulous Lower   Pulmonary asthma , COPD,    Pulmonary exam normal breath sounds clear to auscultation       Cardiovascular hypertension, Pt. on medications negative cardio ROS Normal cardiovascular exam Rhythm:Regular Rate:Normal     Neuro/Psych Anxiety Depression Bipolar Disorder  Neuromuscular disease negative psych ROS   GI/Hepatic Neg liver ROS, GERD  ,  Endo/Other  diabetes, Type 2Hypothyroidism Morbid obesity  Renal/GU negative Renal ROS  negative genitourinary   Musculoskeletal  (+) Arthritis ,   Abdominal (+) + obese,   Peds negative pediatric ROS (+)  Hematology negative hematology ROS (+)   Anesthesia Other Findings   Reproductive/Obstetrics negative OB ROS                            Anesthesia Physical Anesthesia Plan  ASA: III  Anesthesia Plan: General   Post-op Pain Management:    Induction: Intravenous  PONV Risk Score and Plan: 3 and Ondansetron, Dexamethasone, Midazolam and Treatment may vary due to age or medical condition  Airway Management Planned: Oral ETT  Additional Equipment:   Intra-op Plan:   Post-operative Plan: Extubation in OR  Informed Consent: I have reviewed the patients History and Physical, chart, labs and discussed the procedure including the risks, benefits and alternatives for the proposed anesthesia with the patient or authorized representative who has indicated his/her understanding and acceptance.     Dental advisory given  Plan Discussed with: CRNA  Anesthesia Plan Comments:         Anesthesia Quick  Evaluation

## 2019-09-18 NOTE — Op Note (Signed)
18 September 2019  Surgeon: Kaylyn Lim, MD, FACS  Asst:  Alphonsa Overall, MD, FACS  Anes:  General endotracheal  Procedure: Laparoscopic sleeve gastrectomy and upper endoscopy--3.5 hours  Diagnosis: Morbid obesity BMI 52.4 with severe metabolic syndrome, DM and steatohepatitis  Complications: Difficult abdominal wall limited access  EBL:   10 cc  Description of Procedure:  The patient was take to OR 1 and given general anesthesia.  The abdomen was prepped with Chloroprep and draped sterilely.  A timeout was performed.  Access to the abdomen was achieved with a 5 mm Optiview without difficulty. The abdominal fat was massive and there were adhesions from a prior umbilical hernia to the anterior abdominal wall.  The liver was nodular and cirrhotic.  No massive collaterals were noted that would suggest portal hypertension.    Following insufflation, the state of the abdomen was found to be amenable to a sleeve gastrectomy and not a bypass.  The ViSiGi 36Fr tube was inserted to deflate the stomach and was pulled back into the esophagus.    The pylorus was identified and we measured 5 cm back and marked the antrum.  At that point we began dissection to take down the greater curvature of the stomach using the Harmonic scalpel.  This dissection was taken all the way up to the left crus.  Posterior attachments of the stomach were also taken down.  Extra trocars were needed (total 8) because of the extreme torque that the thick abdominal wall produced on the instrumentation.  The short gastric vessels were taken down with the Harmonic.    The ViSiGi tube was then passed into the antrum and suction applied so that it was snug along the lessor curvature.  The "crow's foot" or incisura was identified.  The sleeve gastrectomy was begun using the [employer] stapler beginning with a 4.5 black load with TRS followed by all black loads with TRS.  The symmetry of the resultant sleeve was cylindrical.  The  specimen was placed into a bag and retrieved with difficulty through the 15 mm trocar.  An Applied trocar used for the 10 mm camera broke and the green plastic pieces were all retrieved from the abdomen.  The 15 trocar was closed with 2 sutures of 0 vicryl.  When the sleeve was complete the tube was taken off suction and insufflated briefly.  The tube was withdrawn.  Upper endoscopy was then performed by Dr. Lucia Gaskins.     The specimen was extracted through the 15 trocar site.  Local was provided by infiltrating with Exparel as a TAP block and closed 4-0 Monocryl and staples. Op time was ~3.5 hours because of the size of the abdominal pannus, the degree of fatty infiltration and steatohepatitis.    Matt B. Hassell Done, [place], [facility], [place]

## 2019-09-18 NOTE — Progress Notes (Addendum)
CBG 272 (did not transfer to results) CBG 225 (4888) Norlene Duel RN, BSN

## 2019-09-18 NOTE — Op Note (Signed)
Name:  [name] MRN: [id number] Date of Surgery: 09/18/2019  Preop Diagnosis:  Morbid Obesity  Postop Diagnosis:  Morbid Obesity (Weight - 190 kg, BMI - 52.4), S/P Gastric Sleeve resection  Procedure:  Upper endoscopy  (Intraoperative)  Surgeon:  Alphonsa Overall, M.D.  Anesthesia:  GET  Indications for procedure: Philip [name] is a 58 y.o. male whose primary care physician is System, Pcp Not In and has completed a gastric sleeve resection today for weight loss by Dr. Hassell Done.  I am doing an intraoperative upper endoscopy to evaluate the gastric pouch after the sleeve gastrectomy.  Operative Note: The patient is under general anesthesia.  Dr. Hassell Done is laparoscoping the patient while I do an upper endoscopy to evaluate the stomach pouch.  With the patient intubated, I passed the Olympus upper endoscope without difficulty down the esophagus.  The esophagus was unremarkable.  The esophago-gastric junction was at 43 cm.    The mucosa of the stomach looked viable and the staple line was intact without bleeding.  I advanced the scope to the pylorus, but did not go through it.  While I insufflated the stomach pouch with air, Dr. Hassell Done  flooded the upper abdomen with saline to put the gastric pouch under saline.  There was no bubbling or evidence of a leak.  There was no evidence of narrowing of the pouch and the gastric sleeve looked tubular.  The scope was then withdrawn.  The esophagus was unremarkable and the patient tolerated the endoscopy without difficulty.  Alphonsa Overall, MD, [facility] Surgery Office phone:  [phone]

## 2019-09-18 NOTE — Progress Notes (Signed)
Discussed post op day goals with patient including ambulation, IS, diet progression, pain, and nausea control.  BSTOP education provided including BSTOP information guide, "Guide for Pain Management after your Bariatric Procedure".  Questions answered. 

## 2019-09-18 NOTE — Interval H&P Note (Signed)
History and Physical Interval Note:  09/18/2019 7:08 AM  Philip [name]  has presented today for surgery, with the diagnosis of Morbid Obesity, DM [name], COPD, Thyroid Disease, GERD, back pain, asthma, depression, home oxygen, OSA.  The various methods of treatment have been discussed with the patient and family. After consideration of risks, benefits and other options for treatment, the patient has consented to  Procedure(s): LAPAROSCOPIC GASTRIC SLEEVE RESECTION (N/A) POSSIBLE LYSIS OF ADHESION POSSIBLE INBEDDED MESH (N/A) as a surgical intervention.  The patient's history has been reviewed, patient examined, no change in status, stable for surgery.  I have reviewed the patient's chart and labs.  Questions were answered to the patient's satisfaction.     Pedro Earls

## 2019-09-18 NOTE — Transfer of Care (Signed)
Immediate Anesthesia Transfer of Care Note  Patient: Philip [name]  Procedure(s) Performed: LAPAROSCOPIC GASTRIC SLEEVE RESECTION (N/A )  Patient Location: PACU  Anesthesia Type:General  Level of Consciousness: drowsy  Airway & Oxygen Therapy: Patient Spontanous Breathing and Patient connected to face mask  Post-op Assessment: Report given to RN and Post -op Vital signs reviewed and stable  Post vital signs: Reviewed and stable  Last Vitals:  Vitals Value Taken Time  BP 155/56 09/18/19 1134  Temp    Pulse 80 09/18/19 1136  Resp 25 09/18/19 1136  SpO2 98 % 09/18/19 1136  Vitals shown include unvalidated device data.  Last Pain:  Vitals:   09/18/19 0553  TempSrc:   PainSc: 0-No pain      Patients Stated Pain Goal: 4 (02/63/78 5885)  Complications: No apparent anesthesia complications

## 2019-09-18 NOTE — Anesthesia Postprocedure Evaluation (Signed)
Anesthesia Post Note  Patient: Philip [name]  Procedure(s) Performed: LAPAROSCOPIC GASTRIC SLEEVE RESECTION (N/A )     Patient location during evaluation: PACU Anesthesia Type: General Level of consciousness: awake and alert Pain management: pain level controlled Vital Signs Assessment: post-procedure vital signs reviewed and stable Respiratory status: spontaneous breathing, nonlabored ventilation and respiratory function stable Cardiovascular status: blood pressure returned to baseline and stable Postop Assessment: no apparent nausea or vomiting Anesthetic complications: no    Last Vitals:  Vitals:   09/18/19 1215 09/18/19 1300  BP: (!) 158/75 (!) 165/72  Pulse: 81 80  Resp: 20 20  Temp: 37 C 37.1 C  SpO2: 93% 94%    Last Pain:  Vitals:   09/18/19 1300  TempSrc:   PainSc: Asleep                 Lynda Rainwater

## 2019-09-19 ENCOUNTER — Encounter (HOSPITAL_COMMUNITY): Payer: Self-pay | Admitting: Surgery

## 2019-09-19 LAB — CBC WITH DIFFERENTIAL/PLATELET
Abs Immature Granulocytes: 0.05 K/uL (ref 0.00–0.07)
Basophils Absolute: 0 K/uL (ref 0.0–0.1)
Basophils Relative: 0 %
Eosinophils Absolute: 0 K/uL (ref 0.0–0.5)
Eosinophils Relative: 0 %
HCT: 42.8 % (ref 39.0–52.0)
Hemoglobin: 13.6 g/dL (ref 13.0–17.0)
Immature Granulocytes: 0 %
Lymphocytes Relative: 13 %
Lymphs Abs: 1.5 K/uL (ref 0.7–4.0)
MCH: 30.3 pg (ref 26.0–34.0)
MCHC: 31.8 g/dL (ref 30.0–36.0)
MCV: 95.3 fL (ref 80.0–100.0)
Monocytes Absolute: 1.8 K/uL — ABNORMAL HIGH (ref 0.1–1.0)
Monocytes Relative: 16 %
Neutro Abs: 8.2 K/uL — ABNORMAL HIGH (ref 1.7–7.7)
Neutrophils Relative %: 71 %
Platelets: 232 K/uL (ref 150–400)
RBC: 4.49 MIL/uL (ref 4.22–5.81)
RDW: 13 % (ref 11.5–15.5)
WBC: 11.5 K/uL — ABNORMAL HIGH (ref 4.0–10.5)
nRBC: 0 % (ref 0.0–0.2)

## 2019-09-19 LAB — GLUCOSE, CAPILLARY
Glucose-Capillary: 260 mg/dL — ABNORMAL HIGH (ref 70–99)
Glucose-Capillary: 272 mg/dL — ABNORMAL HIGH (ref 70–99)
Glucose-Capillary: 225 mg/dL — ABNORMAL HIGH (ref 70–99)
Glucose-Capillary: 180 mg/dL — ABNORMAL HIGH (ref 70–99)
Glucose-Capillary: 176 mg/dL — ABNORMAL HIGH (ref 70–99)
Glucose-Capillary: 235 mg/dL — ABNORMAL HIGH (ref 70–99)

## 2019-09-19 MED ORDER — ENOXAPARIN SODIUM 60 MG/0.6ML ~~LOC~~ SOLN: 60.0000 mg | Freq: Two times a day (BID) | SUBCUTANEOUS | 0 refills | Status: AC

## 2019-09-19 MED ORDER — ENOXAPARIN SODIUM 60 MG/0.6ML ~~LOC~~ SOLN
60.0000 mg | Freq: Two times a day (BID) | SUBCUTANEOUS | Status: DC
  Administered 2019-09-19: 60 mg via SUBCUTANEOUS
  Filled 2019-09-19 (×2): qty 0.6

## 2019-09-19 MED ORDER — OXYCODONE HCL 5 MG PO TABS: 5.0000 mg | ORAL_TABLET | Freq: Four times a day (QID) | ORAL | 0 refills | Status: AC | PRN

## 2019-09-19 MED ORDER — TOUJEO MAX SOLOSTAR 300 UNIT/ML ~~LOC~~ SOPN: 10.0000 [IU] | PEN_INJECTOR | Freq: Every day | SUBCUTANEOUS | 1 refills | Status: AC

## 2019-09-19 NOTE — Progress Notes (Signed)
D/C instructions given to patient. Patient had no questions. Writer educated patient regarding lovenox

## 2019-09-19 NOTE — Progress Notes (Addendum)
Patient alert and oriented, pain is controlled. Patient is tolerating fluids, advanced to protein shake today, patient is tolerating well.  Reviewed Gastric sleeve discharge instructions with patient and patient is able to articulate understanding.  Provided information on BELT program, Support Group and [facility] outpatient pharmacy. Lovenox educated reinforced.   Patient education kit given to patient and education by bedside RN, Jolly Mango.  All questions answered, will continue to monitor.  Total fluid intake 737 Call back one week post op

## 2019-09-19 NOTE — Progress Notes (Signed)
Nutrition Note  RD consulted for diet education for patient s/p bariatric surgery. Bariatric nurse coordinator providing education at this time.  If nutrition issues arise, please consult RD.   Kamare Caspers, MS, RD, LDN Inpatient Clinical Dietitian Pager: [phone] After Hours Pager: [phone]  

## 2019-09-19 NOTE — Progress Notes (Signed)
Patient ID: Philip [name], male   DOB: 1961/06/23, 58 y.o.   MRN: [id number] Stable and looking good so far. Will start Lovenox 60 q 12 hrs Plan discharge later today  Kaylyn Lim, MD, FACS

## 2019-09-19 NOTE — TOC Benefit Eligibility Note (Signed)
Transition of Care [facility]) Benefit Eligibility Note    Patient Details  Name: Philip [name] MRN: [id number] Date of Birth: 1961/06/21   Medication/Dose: Lovenox 60 mg q 12 hrs        Prescription Coverage Preferred Pharmacy: local  Spoke with Person/Company/Phone Number:: Geraldo Pitter [phone] policy expired [phone] [place] card copied in Document list  Co-Pay: $15.00             Kerin Salen Phone Number: 09/19/2019, 12:10 PM

## 2019-09-19 NOTE — Progress Notes (Signed)
Inpatient Diabetes Program Recommendations  AACE/ADA: New Consensus Statement on Inpatient Glycemic Control (2015)  Target Ranges:  Prepandial:   less than 140 mg/dL      Peak postprandial:   less than 180 mg/dL (1-2 hours)      Critically ill patients:  140 - 180 mg/dL   Lab Results  Component Value Date   GLUCAP 235 (H) 09/19/2019   HGBA1C 9.4 (H) 09/13/2019    Review of Glycemic Control  Results for Philip [name], Philip [name] (MRN [id number]) as of 09/19/2019 13:51  Ref. Range 09/18/2019 22:18 09/19/2019 01:17 09/19/2019 05:14 09/19/2019 07:48 09/19/2019 12:02  Glucose-Capillary Latest Ref Range: 70 - 99 mg/dL 272 (H) 225 (H) 180 (H) 176 (H) 235 (H)    Diabetes history: DM2 Outpatient Diabetes medications: Toujeo 70 units BID; Humalog 20 units TID; Victoza 1.8 QD; Metformin mg 1000 BID  Current orders for Inpatient glycemic control: Lantus 10u QD; Novolog 0-20 Q4H  Inpatient Diabetes Program Recommendations:     For Discharge:  Toujeo 10 units QHS & Novolog 0-20 TID AC & HS  Note: Discussed monitoring blood sugars four times a day and notifying PCP if blood sugars are consistently greater than 200.  Discussed signs and symptoms of hypoglycemia and treatment.     Thank you, Geoffry Paradise, RN, BSN Diabetes Coordinator Inpatient Diabetes Program [phone] (team pager from 8a-5p)

## 2019-09-19 NOTE — Progress Notes (Signed)
12 staples removed and steri-strips applied

## 2019-09-19 NOTE — Discharge Instructions (Signed)
Enoxaparin injection What is this medicine? ENOXAPARIN (ee nox a PA rin) is used after knee, hip, or abdominal surgeries to prevent blood clotting. It is also used to treat existing blood clots in the lungs or in the veins. This medicine may be used for other purposes; ask your health care provider or pharmacist if you have questions. COMMON BRAND NAME(S): Lovenox What should I tell my health care provider before I take this medicine? They need to know if you have any of these conditions:  bleeding disorders, hemorrhage, or hemophilia  infection of the heart or heart valves  kidney or liver disease  previous stroke  prosthetic heart valve  recent surgery or delivery of a baby  ulcer in the stomach or intestine, diverticulitis, or other bowel disease  an unusual or allergic reaction to enoxaparin, heparin, pork or pork products, other medicines, foods, dyes, or preservatives  pregnant or trying to get pregnant  breast-feeding How should I use this medicine? This medicine is for injection under the skin. It is usually given by a health-care professional. You or a family member may be trained on how to give the injections. If you are to give yourself injections, make sure you understand how to use the syringe, measure the dose if necessary, and give the injection. To avoid bruising, do not rub the site where this medicine has been injected. Do not take your medicine more often than directed. Do not stop taking except on the advice of your doctor or health care professional. Make sure you receive a puncture-resistant container to dispose of the needles and syringes once you have finished with them. Do not reuse these items. Return the container to your doctor or health care professional for proper disposal. Talk to your pediatrician regarding the use of this medicine in children. Special care may be needed. Overdosage: If you think you have taken too much of this medicine contact a poison  control center or emergency room at once. NOTE: This medicine is only for you. Do not share this medicine with others. What if I miss a dose? If you miss a dose, take it as soon as you can. If it is almost time for your next dose, take only that dose. Do not take double or extra doses. What may interact with this medicine?  aspirin and aspirin-like medicines  certain medicines that treat or prevent blood clots  dipyridamole  NSAIDs, medicines for pain and inflammation, like ibuprofen or naproxen This list may not describe all possible interactions. Give your health care provider a list of all the medicines, herbs, non-prescription drugs, or dietary supplements you use. Also tell them if you smoke, drink alcohol, or use illegal drugs. Some items may interact with your medicine. What should I watch for while using this medicine? Visit your healthcare professional for regular checks on your progress. You may need blood work done while you are taking this medicine. Your condition will be monitored carefully while you are receiving this medicine. It is important not to miss any appointments. If you are going to need surgery or other procedure, tell your healthcare professional that you are using this medicine. Using this medicine for a long time may weaken your bones and increase the risk of bone fractures. Avoid sports and activities that might cause injury while you are using this medicine. Severe falls or injuries can cause unseen bleeding. Be careful when using sharp tools or knives. Consider using an electric razor. Take special care brushing or flossing your   teeth. Report any injuries, bruising, or red spots on the skin to your healthcare professional. Wear a medical ID bracelet or chain. Carry a card that describes your disease and details of your medicine and dosage times. What side effects may I notice from receiving this medicine? Side effects that you should report to your doctor or health  care professional as soon as possible:  allergic reactions like skin rash, itching or hives, swelling of the face, lips, or tongue  bone pain  signs and symptoms of bleeding such as bloody or black, tarry stools; red or dark-brown urine; spitting up blood or brown material that looks like coffee grounds; red spots on the skin; unusual bruising or bleeding from the eye, gums, or nose  signs and symptoms of a blood clot such as chest pain; shortness of breath; pain, swelling, or warmth in the leg  signs and symptoms of a stroke such as changes in vision; confusion; trouble speaking or understanding; severe headaches; sudden numbness or weakness of the face, arm or leg; trouble walking; dizziness; loss of coordination Side effects that usually do not require medical attention (report to your doctor or health care professional if they continue or are bothersome):  hair loss  pain, redness, or irritation at site where injected This list may not describe all possible side effects. Call your doctor for medical advice about side effects. You may report side effects to FDA at 1-800-FDA-1088. Where should I keep my medicine? Keep out of the reach of children. Store at room temperature between 15 and 30 degrees C (59 and 86 degrees F). Do not freeze. If your injections have been specially prepared, you may need to store them in the refrigerator. Ask your pharmacist. Throw away any unused medicine after the expiration date. NOTE: This sheet is a summary. It may not cover all possible information. If you have questions about this medicine, talk to your doctor, pharmacist, or health care provider.  2020 Elsevier/Gold Standard (2017-09-22 11:25:34)     GASTRIC BYPASS/SLEEVE  Home Care Instructions   These instructions are to help you care for yourself when you go home.  Call: If you have any problems. . Call [phone] and ask for the surgeon on call . If you need immediate help, come to the ER  at [place].  . Tell the ER staff that you are a new post-op gastric bypass or gastric sleeve patient   Signs and symptoms to report: . Severe vomiting or nausea o If you cannot keep down clear liquids for longer than 1 day, call your surgeon  . Abdominal pain that does not get better after taking your pain medication . Fever over 100.4 F with chills . Heart beating over 100 beats a minute . Shortness of breath at rest . Chest pain .  Redness, swelling, drainage, or foul odor at incision (surgical) sites .  If your incisions open or pull apart . Swelling or pain in calf (lower leg) . Diarrhea (Loose bowel movements that happen often), frequent watery, uncontrolled bowel movements . Constipation, (no bowel movements for 3 days) if this happens: Pick one o Milk of Magnesia, 2 tablespoons by mouth, 3 times a day for 2 days if needed o Stop taking Milk of Magnesia once you have a bowel movement o Call your doctor if constipation continues Or o Miralax  (instead of Milk of Magnesia) following the label instructions o Stop taking Miralax once you have a bowel movement o Call your doctor if constipation   continues . Anything you think is not normal   Normal side effects after surgery: . Unable to sleep at night or unable to focus . Irritability or moody . Being tearful (crying) or depressed These are common complaints, possibly related to your anesthesia medications that put you to sleep, stress of surgery, and change in lifestyle.  This usually goes away a few weeks after surgery.  If these feelings continue, call your primary care doctor.   Wound Care: You may have surgical glue, steri-strips, or staples over your incisions after surgery . Surgical glue:  Looks like a clear film over your incisions and will wear off a little at a time . Steri-strips: Strips of tape over your incisions. You may notice a yellowish color on the skin under the steri-strips. This is used to make the    steri-strips stick better. Do not pull the steri-strips off - let them fall off . Staples: Staples may be removed before you leave the hospital o If you go home with staples, call [facility], [phone] at for an appointment with your surgeon's nurse to have staples removed 10 days after surgery. . Showering: You may shower two (2) days after your surgery unless your surgeon tells you differently o Wash gently around incisions with warm soapy water, rinse well, and gently pat dry  o No tub baths until staples are removed, steri-strips fall off or glue is gone.    Medications: . Medications should be liquid or crushed if larger than the size of a dime . Extended release pills (medication that release a little bit at a time through the day) should NOT be crushed or cut. (examples include XL, ER, DR, SR) . Depending on the size and number of medications you take, you may need to space (take a few throughout the day)/change the time you take your medications so that you do not over-fill your pouch (smaller stomach) . Make sure you follow-up with your primary care doctor to make medication changes needed during rapid weight loss and life-style changes . If you have diabetes, follow up with the doctor that orders your diabetes medication(s) within one week after surgery and check your blood sugar regularly. . Do not drive while taking prescription pain medication  . It is ok to take Tylenol by the bottle instructions with your pain medicine or instead of your pain medicine as needed.  DO NOT TAKE NSAIDS (EXAMPLES OF NSAIDS:  IBUPROFREN/ NAPROXEN)  Diet:                    First 2 Weeks  You will see the dietician t about two (2) weeks after your surgery. The dietician will increase the types of foods you can eat if you are handling liquids well: . If you have severe vomiting or nausea and cannot keep down clear liquids lasting longer than 1 day, call your surgeon @ ([phone]) Protein  Shake . Drink at least 2 ounces of shake 5-6 times per day . Each serving of protein shakes (usually 8 - 12 ounces) should have: o 15 grams of protein  o And no more than 5 grams of carbohydrate  . Goal for protein each day: o Men = 80 grams per day o Women = 60 grams per day . Protein powder may be added to fluids such as non-fat milk or Lactaid milk or unsweetened Soy/Almond milk (limit to 35 grams added protein powder per serving)  Hydration . Slowly increase the   amount of water and other clear liquids as tolerated (See Acceptable Fluids) . Slowly increase the amount of protein shake as tolerated  .  Sip fluids slowly and throughout the day.  Do not use straws. . May use sugar substitutes in small amounts (no more than 6 - 8 packets per day; i.e. Splenda)  Fluid Goal . The first goal is to drink at least 8 ounces of protein shake/drink per day (or as directed by the nutritionist); some examples of protein shakes are Syntrax Nectar, Adkins Advantage, EAS Edge HP, and Unjury. See handout from pre-op Bariatric Education Class: o Slowly increase the amount of protein shake you drink as tolerated o You may find it easier to slowly sip shakes throughout the day o It is important to get your proteins in first . Your fluid goal is to drink 64 - 100 ounces of fluid daily o It may take a few weeks to build up to this . 32 oz (or more) should be clear liquids  And  . 32 oz (or more) should be full liquids (see below for examples) . Liquids should not contain sugar, caffeine, or carbonation  Clear Liquids: . Water or Sugar-free flavored water (i.e. Fruit H2O, Propel) . Decaffeinated coffee or tea (sugar-free) . Crystal Lite, Wyler's Lite, Minute Maid Lite . Sugar-free Jell-O . Bouillon or broth . Sugar-free Popsicle:   *Less than 20 calories each; Limit 1 per day  Full Liquids: Protein Shakes/Drinks + 2 choices per day of other full liquids . Full liquids must be: o No More Than 15  grams of Carbs per serving  o No More Than 3 grams of Fat per serving . Strained low-fat cream soup (except Cream of Potato or Tomato) . Non-Fat milk . Fat-free Lactaid Milk . Unsweetened Soy Or Unsweetened Almond Milk . Low Sugar yogurt (Dannon Lite & Fit, Greek yogurt; Oikos Triple Zero; Chobani Simply 100; Yoplait 100 calorie Greek - No Fruit on the Bottom)    Vitamins and Minerals . Start 1 day after surgery unless otherwise directed by your surgeon . 2 Chewable Bariatric Specific Multivitamin / Multimineral Supplement with iron (Example: Bariatric Advantage Multi EA) . Chewable Calcium with Vitamin D-3 (Example: 3 Chewable Calcium Plus 600 with Vitamin D-3) o Take 500 mg three (3) times a day for a total of 1500 mg each day o Do not take all 3 doses of calcium at one time as it may cause constipation, and you can only absorb 500 mg  at a time  o Do not mix multivitamins containing iron with calcium supplements; take 2 hours apart . Menstruating women and those with a history of anemia (a blood disease that causes weakness) may need extra iron o Talk with your doctor to see if you need more iron . Do not stop taking or change any vitamins or minerals until you talk to your dietitian or surgeon . Your Dietitian and/or surgeon must approve all vitamin and mineral supplements   Activity and Exercise: Limit your physical activity as instructed by your doctor.  It is important to continue walking at home.  During this time, use these guidelines: . Do not lift anything greater than ten (10) pounds for at least two (2) weeks . Do not go back to work or drive until your surgeon says you can . You may have sex when you feel comfortable  o It is VERY important for male patients to use a reliable birth control method; fertility often increases after   surgery  o All hormonal birth control will be ineffective for 30 days after surgery due to medications given during surgery a barrier method must be  used. o Do not get pregnant for at least 18 months . Start exercising as soon as your doctor tells you that you can o Make sure your doctor approves any physical activity . Start with a simple walking program . Walk 5-15 minutes each day, 7 days per week.  . Slowly increase until you are walking 30-45 minutes per day Consider joining our BEL[employer] program. [phone] or email [email or web address]   Special Instructions Things to remember: . Use your CPAP when sleeping if this applies to you  . [facility] has two free Bariatric Surgery Support Groups that meet monthly o The 3rd Thursday of each month, 6 pm, [facility]  o The 2nd Friday of each month, 11:45 am in the private dining room in the basement of [place] . It is very important to keep all follow up appointments with your surgeon, dietitian, primary care physician, and behavioral health practitioner . Routine follow up schedule with your surgeon include appointments at 2-3 weeks, 6-8 weeks, 6 months, and 1 year at a minimum.  Your surgeon may request to see you more often.   o After the first year, please follow up with your bariatric surgeon and dietitian at least once a year in order to maintain best weight loss results [facility]: [phone] [facility]: [phone] Bariatric Nurse Coordinator: [phone]      Reviewed and Endorsed  by [facility] Patient Education Committee, June, 2016 Edits Approved: Aug, 2018    

## 2019-09-20 LAB — SURGICAL PATHOLOGY

## 2019-09-21 ENCOUNTER — Telehealth: Payer: Self-pay | Admitting: Dietician

## 2019-09-21 NOTE — Telephone Encounter (Signed)
Philip [name] called with questions following his recent sleeve gastrectomy surgery. He reports some symptoms of indigestion, and confirms that he is taking pantoprazole as prescribed. He also is having some loose stools; has started taking Metformin to manage diabetes with reduced insulin dose.  He requests changing his MNT follow-up appointment, as he will be coming in from [facility] to attend; has appt with Dr. Hassell Done on 10/17/19. Will schedule Mr. Scully for MNT that same day, and will conduct phone visit on 10/03/19 to check on diet progression.

## 2019-09-24 ENCOUNTER — Telehealth (HOSPITAL_COMMUNITY): Payer: Self-pay

## 2019-09-24 NOTE — Telephone Encounter (Signed)
Patient called to discuss post bariatric surgery follow up questions.  See below:   1.  Tell me about your pain and pain management?denies  2.  Let's talk about fluid intake.  How much total fluid are you taking in?40-65 ounces of fluid  3.  How much protein have you taken in the last 2 days?60-80 grams  4.  Have you had nausea?  Tell me about when have experienced nausea and what you did to help?denies  5.  Has the frequency or color changed with your urine?light in color, no changes  6.  Tell me what your incisions look like?no problems  7.  Have you been passing gas? BM?bm since discharge  8.  If a problem or question were to arise who would you call?  Do you know contact numbers for [facility], [facility], and [facility]?aware of how to contact all services  9.  How has the walking going?walking regularly  10.  How are your vitamins and calcium going?  How are you taking them?started mvi and calcium  Went to ED in [place] for knee pain due to pulled ligaments

## 2019-09-26 ENCOUNTER — Telehealth: Payer: Self-pay | Admitting: Dietician

## 2019-09-26 NOTE — Telephone Encounter (Signed)
Patient called to confirm in-person appointment scheduled for 1:15pm on 10/17/19. He reports having weight checked today, which was down to 389lbs. He is having knee pain and has orthopedic appointment soon.

## 2019-09-27 NOTE — Discharge Summary (Signed)
Physician Discharge Summary  Patient ID: Philip [name] MRN: [id number] DOB/AGE: 1961/02/19 58 y.o.  PCP: System, Pcp Not In  Admit date: 09/18/2019 Discharge date: 09/19/2019  Admission Diagnoses:  Morbid obesity  Discharge Diagnoses:  same  Principal Problem:   S/P laparoscopic sleeve gastrectomy Dec 2020   Surgery:  Lap sleeve gastrectomy  Discharged Condition: improved  Hospital Course:   58 year old man from [place] had sleeve gastrectomy.  Ready for discharge on Lovenox.  He couldn't afford his copay so our nurse provided him with the $15 copay that he needed to get his Lovenox.  He was ready for discharge on PD 1  Consults: pharmacy  Significant Diagnostic Studies: none    Discharge Exam: Blood pressure (!) 179/77, pulse 73, temperature 97.9 F (36.6 C), temperature source Oral, resp. rate 18, height 6\' 3"  (1.905 m), weight (!) 190.1 kg, SpO2 94 %. Incisions ok  Disposition:   Discharge Instructions    Ambulate hourly while awake   Complete by: As directed    Call MD for:  difficulty breathing, headache or visual disturbances   Complete by: As directed    Call MD for:  persistant dizziness or light-headedness   Complete by: As directed    Call MD for:  persistant nausea and vomiting   Complete by: As directed    Call MD for:  redness, tenderness, or signs of infection (pain, swelling, redness, odor or green/yellow discharge around incision site)   Complete by: As directed    Call MD for:  severe uncontrolled pain   Complete by: As directed    Call MD for:  temperature >101 F   Complete by: As directed    Diet bariatric full liquid   Complete by: As directed    Incentive spirometry   Complete by: As directed    Perform hourly while awake        Reactions   Aripiprazole    Other reaction(s): Other (see comments) Unknown per patient   Duloxetine Hcl Other (See Comments)   unsure   Fenofibrate    Other reaction(s): Other (see comments) Muscle  twitching   Lamotrigine Other (See Comments)   fever   Neosporin [neomycin-bacitracin Zn-polymyx] Other (See Comments)   Worsens wound   Other    Anesthesia-gets sick   Pregabalin Swelling, Other (See Comments)   Swelling and weight gain   Simvastatin    Other reaction(s): Other (see comments) Myalgia   Latex Rash   Valproic Acid Rash      Medication List    STOP taking these medications   citalopram 40 MG tablet Commonly known as: CELEXA   Daily Mens Health Formula Tabs   DHEA 25 MG Caps   diclofenac 75 MG EC tablet Commonly known as: VOLTAREN   Glucerna 1.0 Cal/CarbSteady Liqd   Healthy Eyes/Lutein Tabs   levocetirizine 5 MG tablet Commonly known as: XYZAL   loperamide 2 MG capsule Commonly known as: IMODIUM   sildenafil 20 MG tablet Commonly known as: REVATIO   testosterone cypionate 200 MG/ML injection Commonly known as: DEPOTESTOSTERONE CYPIONATE   tiZANidine 4 MG tablet Commonly known as: ZANAFLEX   traMADol 50 MG tablet Commonly known as: ULTRAM   Victoza 18 MG/3ML Sopn Generic drug: liraglutide   ziprasidone 20 MG capsule Commonly known as: GEODON     TAKE these medications   2-3CC SYRINGE 3 ML Misc 1 Container by Does not apply route once a week.   albuterol 108 (90 Base) MCG/ACT  inhaler Commonly known as: Ventolin HFA INHALE 1 TO 2 puffs by mouth every 4-6 hours prn for wheezing and shortness of breath What changed:   how much to take  how to take this  when to take this  reasons to take this  additional instructions   aspirin EC 81 MG tablet Take 81 mg by mouth daily.   B-D ULTRAFINE III SHORT PEN 31G X 8 MM Misc Generic drug: Insulin Pen Needle USE TO INJECT FIVE TIMES A DAY   budesonide-formoterol 160-4.5 MCG/ACT inhaler Commonly known as: Symbicort Inhale 2 puffs into the lungs 2 (two) times daily.   buPROPion 200 MG 12 hr tablet Commonly known as: WELLBUTRIN SR TAKE 1 TABLET BY MOUTH  TWICE DAILY   collagenase  ointment Commonly known as: Santyl Apply thin layer to wound 1-2 times daily until healing has begun What changed:   how much to take  how to take this  when to take this  reasons to take this   diclofenac sodium 1 % Gel Commonly known as: VOLTAREN Apply 4 g topically 4 (four) times daily. What changed:   when to take this  reasons to take this  additional instructions   enoxaparin 60 MG/0.6ML injection Commonly known as: LOVENOX Inject 0.6 mLs (60 mg total) into the skin every 12 (twelve) hours for 14 days.   fluticasone 50 MCG/ACT nasal spray Commonly known as: FLONASE Place 2 sprays into both nostrils daily. What changed:   when to take this  reasons to take this   furosemide 40 MG tablet Commonly known as: LASIX Take 1 tablet (40 mg total) by mouth daily. What changed:   when to take this  reasons to take this   gabapentin 400 MG capsule Commonly known as: NEURONTIN Take 2 capsules (800 mg total) by mouth 4 (four) times daily.   HM Vitamin D3 50 MCG (2000 UT) Caps Generic drug: Cholecalciferol Take 4,000 Units by mouth 2 (two) times daily.   insulin lispro 100 UNIT/ML KwikPen Commonly known as: HumaLOG KwikPen INJECT 20 UNITS UNDER THE SKIN TID BEFORE MEALS plus Sliding Scale correction up to 15 additional units per dose. What changed:   how much to take  how to take this  when to take this  additional instructions Notes to patient: CBG 70 - 120: 0 units  CBG 121 - 150: 3 units  CBG 151 - 200: 4 units  CBG 201 - 250: 7 units  CBG 251 - 300: 11 units  CBG 301 - 350: 15 units    levothyroxine 200 MCG tablet Commonly known as: SYNTHROID Take 1 tablet (200 mcg total) by mouth daily before breakfast. What changed: additional instructions   levothyroxine 25 MCG tablet Commonly known as: SYNTHROID TAKE 1 TABLET(25 MCG) BY MOUTH DAILY BEFORE BREAKFAST with 200 mcg tablet for 225 mcg total dose. What changed:   how much to take  how to  take this  when to take this  additional instructions   lisinopril 40 MG tablet Commonly known as: ZESTRIL Take 1 tablet (40 mg total) by mouth daily. Notes to patient: Monitor Blood Pressure Daily and keep a log for primary care physician.  You may need to make changes to your medications with rapid weight loss.     metFORMIN 500 MG 24 hr tablet Commonly known as: GLUCOPHAGE-XR TAKE 2 TABLETS(1000 MG) BY MOUTH TWICE DAILY WITH A MEAL What changed:   how much to take  how to take this  when  to take this  additional instructions   multivitamin with minerals Tabs tablet Take 1 tablet by mouth daily. Centrum silver   NEEDLE (DISP) 18 G 18G X 1-1/2" Misc 1 Container by Does not apply route once a week.   NEEDLE (DISP) 21 G 21G X 1-1/2" Misc 1 Container by Does not apply route once a week.   NEEDLE (DISP) 21 G 21G X 1-1/2" Misc For testosterone injections as directed   omeprazole 40 MG capsule Commonly known as: PRILOSEC TAKE 1 CAPSULE(40 MG) BY MOUTH TWICE DAILY What changed:   how much to take  how to take this  when to take this  additional instructions   oxyCODONE 5 MG immediate release tablet Commonly known as: Oxy IR/ROXICODONE Take 1 tablet (5 mg total) by mouth every 6 (six) hours as needed for severe pain.   potassium chloride SA 20 MEQ tablet Commonly known as: KLOR-CON Take 1 tablet (20 mEq total) by mouth daily.   pyridOXINE 100 MG tablet Commonly known as: VITAMIN B-6 Take 100 mg by mouth daily.   Toujeo Max SoloStar 300 UNIT/ML Sopn Generic drug: Insulin Glargine (2 Unit Dial) Inject 10 Units into the skin at bedtime. What changed:   how much to take  when to take this   vitamin B-12 500 MCG tablet Commonly known as: CYANOCOBALAMIN Take 500 mcg by mouth daily.      Follow-up Information    Luretha Murphy, MD. Go on 10/17/2019.   Specialty: General Surgery Why: at [address] information: [address] [place]  [address] [phone]        Luretha Murphy, MD .   Specialty: General Surgery Contact information: [address] [place] [address] [phone]           Signed: Valarie Merino 09/27/2019, 7:26 AM

## 2019-10-01 ENCOUNTER — Telehealth: Payer: Self-pay | Admitting: Dietician

## 2019-10-01 NOTE — Telephone Encounter (Signed)
Returned message from patient to answer questions. He asked about what the worst effects  from eating foods outside of bariatric diet guidelines would be. Discussed effects of severe pain, potential blockage, dehydration, dumping. He would like to know what he might be able to eat on Christmas day -- advised protein only; patient would like to eat sweet potato -- advised 1-2 bites of mashed/ whipped potato but that dumping or other effects could occur from even small amount of poorly tolerated food.

## 2019-10-03 ENCOUNTER — Other Ambulatory Visit: Payer: Self-pay

## 2019-10-03 ENCOUNTER — Ambulatory Visit: Payer: 59 | Admitting: Dietician

## 2019-10-03 ENCOUNTER — Encounter: Payer: 59 | Attending: Surgery | Admitting: Dietician

## 2019-10-03 ENCOUNTER — Encounter: Payer: Self-pay | Admitting: Dietician

## 2019-10-03 VITALS — Ht 75.0 in | Wt 389.0 lb

## 2019-10-03 DIAGNOSIS — E669 Obesity, unspecified: Secondary | ICD-10-CM | POA: Insufficient documentation

## 2019-10-03 DIAGNOSIS — E119 Type 2 diabetes mellitus without complications: Secondary | ICD-10-CM | POA: Diagnosis not present

## 2019-10-03 DIAGNOSIS — Z6841 Body Mass Index (BMI) 40.0 and over, adult: Secondary | ICD-10-CM | POA: Insufficient documentation

## 2019-10-03 DIAGNOSIS — Z9884 Bariatric surgery status: Secondary | ICD-10-CM | POA: Insufficient documentation

## 2019-10-03 DIAGNOSIS — Z713 Dietary counseling and surveillance: Secondary | ICD-10-CM | POA: Diagnosis not present

## 2019-10-03 NOTE — Progress Notes (Signed)
Follow-up visit:  2 Weeks Post-Operative Sleeve Gastrectomy Surgery  Medical Nutrition Therapy:  Appt start time: 5784 end time:  6962.  Primary concerns today: Post-operative Bariatric Surgery Nutrition Management.   Learning Readiness:   Change in progress  Progress: Patient reports noticing physical changes such as weight loss from face, arms, feet. His clothing is fitting more loosely.  He reports feeling well and denies any adverse symptoms. His knee pain is improved since having injection recently.  He is having some cravings for foods such as popcorn.   Dietary recall: Breakfast: protein shake Snack:  none Lunch: protein shake Snack: yogurt  Dinner: soup or protein shake Snack: none  Fluid intake: patient reports >64oz daily Estimated total protein intake: 70-90g daily  Medications: will review on 10/17/19 Supplementation: will review on 10/17/19  Using straws:  Drinking while eating: no Hair loss:  Carbonated beverages: no N/V/D/C: no Dumping syndrome: no   Recent physical activity:  PT for knee  Progress Towards Goal(s):  In progress.  Handouts given during visit include:  Will provide at in-person visit    Nutritional Diagnosis:  [place]-3.3 Overweight/obesity As related to history of excess calories and inactivity.  As evidenced by patient with current BMI of 48.62, following bariatric diet guidelines for ongoing weight loss after sleeve gastrectomy.    Intervention:    Reviewed progress since surgery and previous phone contact.  Instructed patient on advancement of diet to include moist, lean protein foods.   Advised gradual reduction of protein shakes as intake of solid food increases.   Discussed possible side effects if consuming other foods including high fat, sugar, fiber, and starchy foods.   In-person visit is scheduled for 10/17/19 when patient is able to come.   Teaching Method Utilized:  Auditory  Barriers to learning/adherence to lifestyle  change: none  Demonstrated degree of understanding via:  Teach Back   Monitoring/Evaluation:  Dietary intake, exercise, and body weight. Follow up 10/17/19 at 1:15pm.

## 2019-10-17 ENCOUNTER — Ambulatory Visit: Payer: 59 | Admitting: Dietician

## 2019-10-31 ENCOUNTER — Telehealth: Payer: Self-pay | Admitting: Dietician

## 2019-10-31 NOTE — Telephone Encounter (Signed)
Returned message from patient to schedule appointment for 11/15/19 -- scheduled for 1:30pm. He is meeting with Dr. Daphine Deutscher at 10:45am on that day.  He reports ongoing progress with weight loss, has advanced his diet to include most foods, in small portions. Using a salad plate. He tried eating spaghetti and garlic bread and felt severe nausea afterwards. At another time he tried sugar free cake with frosting but did not feel well afterwards.  Advised patient to avoid/ strictly limit processed starches and sweets.

## 2019-11-15 ENCOUNTER — Ambulatory Visit: Payer: 59 | Admitting: Dietician

## 2020-07-09 ENCOUNTER — Encounter (HOSPITAL_COMMUNITY): Payer: Self-pay

## 2021-02-09 IMAGING — RF DG UGI W/ HIGH DENSITY W/O KUB
12 of 13 series · 14 of 16 positions shown · non-contrast
Comparison: None.

CLINICAL DATA: Preop gastric bypass, morbid obesity

EXAM:
UPPER GI SERIES WITH KUB
TECHNIQUE: After obtaining a scout radiograph a routine upper GI series was
performed using thin and high density barium.
FLUOROSCOPY TIME:  Fluoroscopy Time:  0.4 minute
Radiation Exposure Index (if provided by the fluoroscopic device):
168.4 mGy
Number of Acquired Spot Images: 7

[Series 2: t abdomen supine · 0.15mm/px · 1 of 1 slices shown (1 of 2)]
[im 1/1]
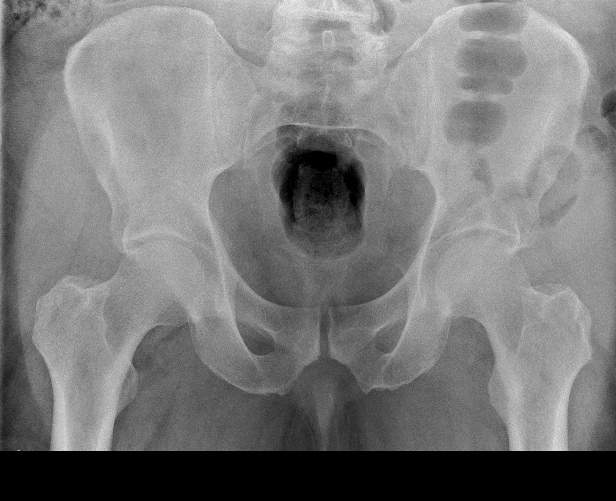

[Series 3: t abdomen supine · 0.15mm/px · 1 of 1 slices shown (2 of 2)]
[im 1/1]
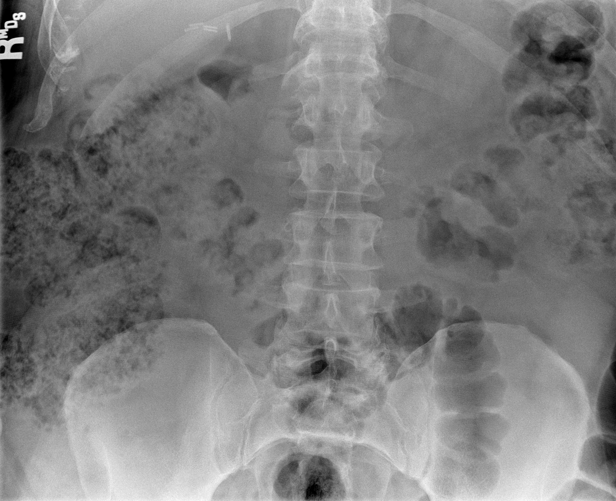

[Series 4: cp_standard · 0.25mm/px · 3 of 51 frames shown (1 of 6)]
[frame 8/51]
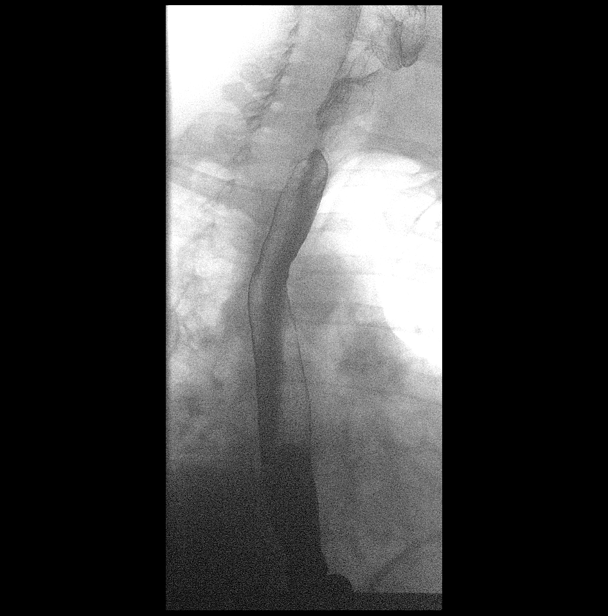
[frame 27/51]
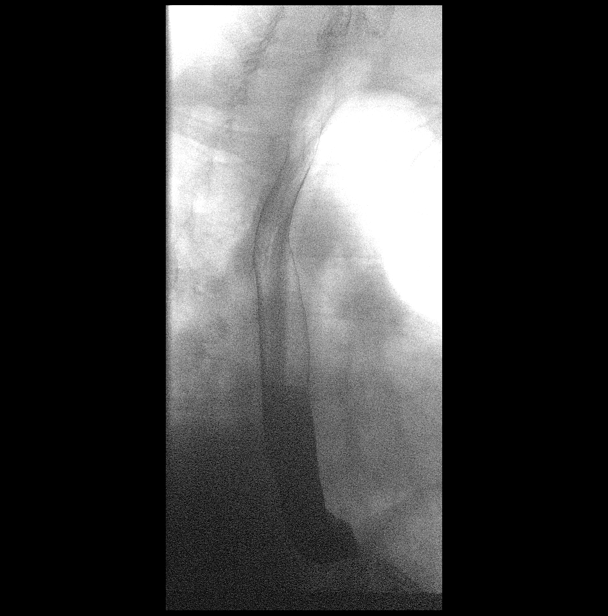
[frame 44/51]
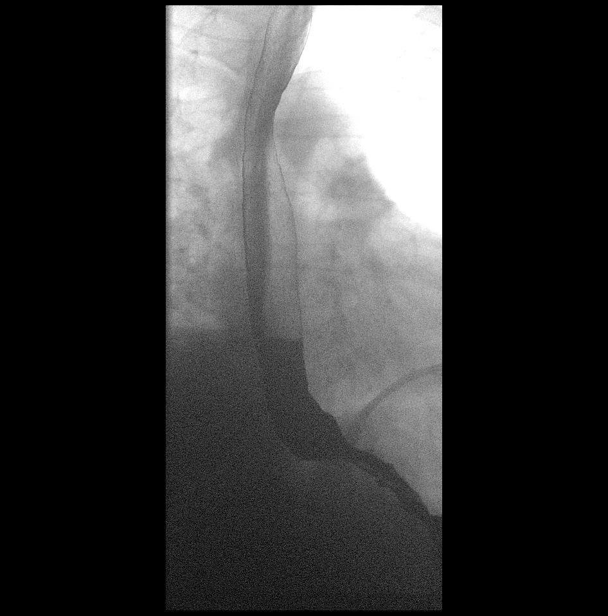

[Series 5: cp_standard · 0.25mm/px · 1 of 1 slices shown (2 of 6)]
[im 1/1]
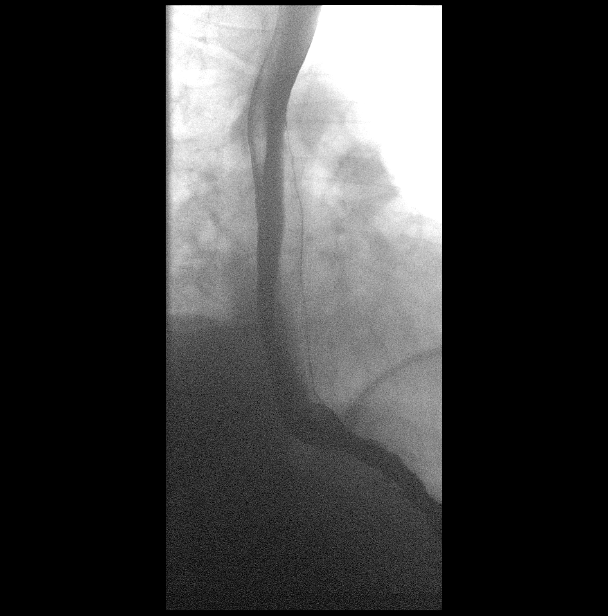

[Series 6: cp_standard · 0.25mm/px · 1 of 1 slices shown (3 of 6)]
[im 1/1]
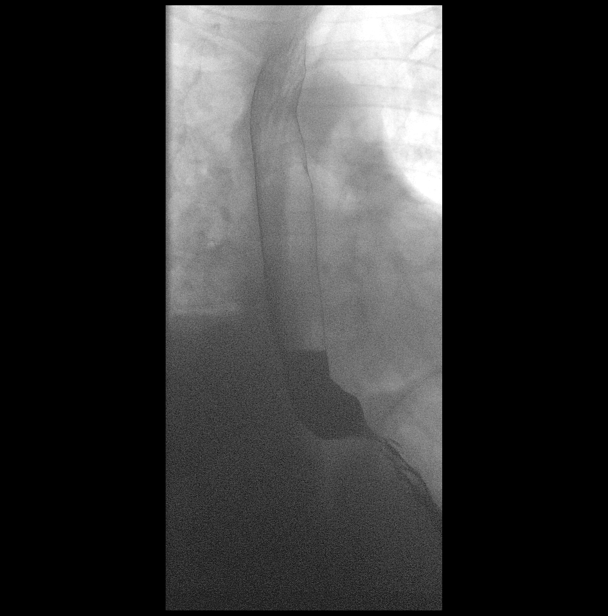

[Series 7: cp_standard · 0.25mm/px · 1 of 1 slices shown (4 of 6)]
[im 1/1]
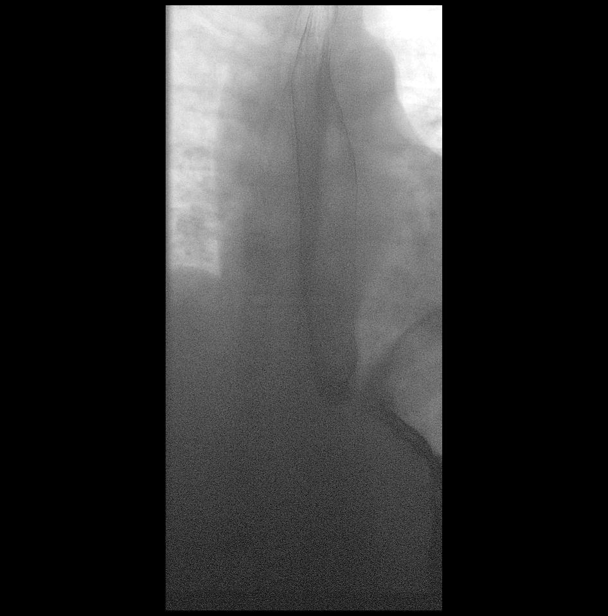

[Series 8: fluoro_barium 2fps_bw · 0.18mm/px · 1 of 1 slices shown (1 of 4)]
[im 1/1]
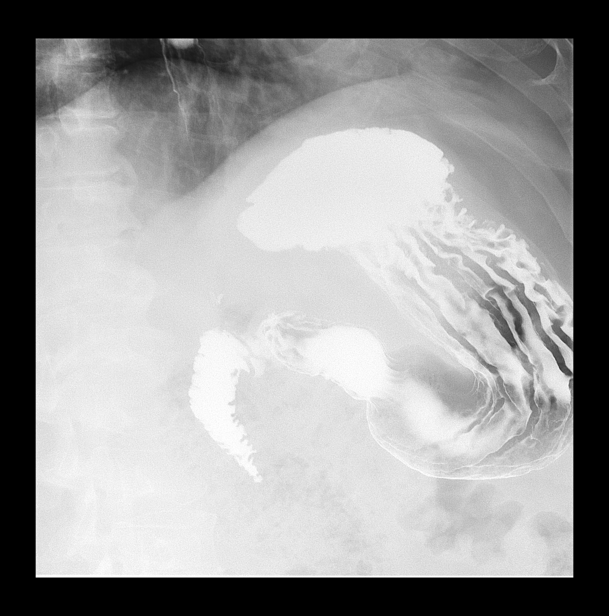

[Series 9: cp_standard · 0.27mm/px · 1 of 1 slices shown (5 of 6)]
[im 1/1]
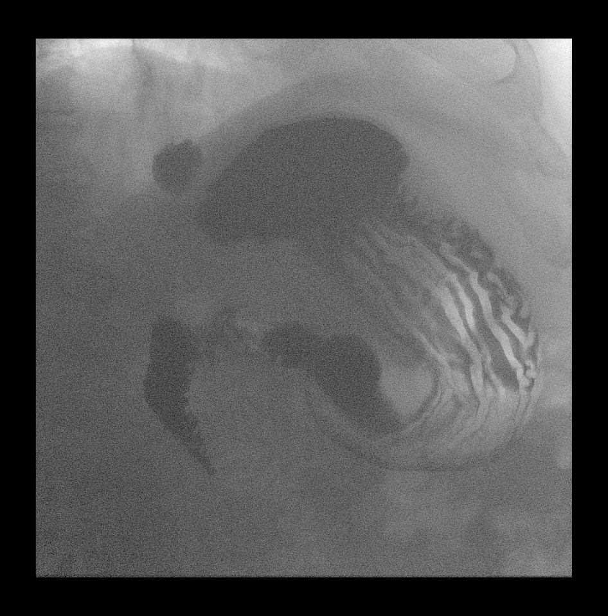

[Series 11: fluoro_barium 2fps_bw · 0.19mm/px · 1 of 1 slices shown (2 of 4)]
[im 1/1]
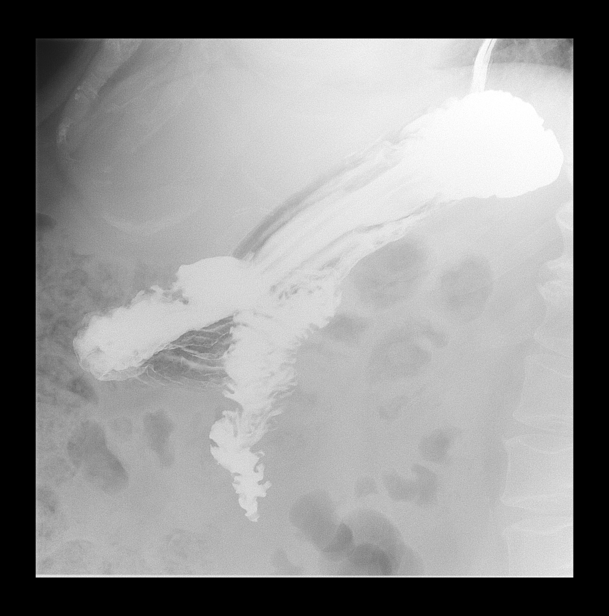

[Series 12: fluoro_barium 2fps_bw · 0.19mm/px · 1 of 1 slices shown (3 of 4)]
[im 1/1]
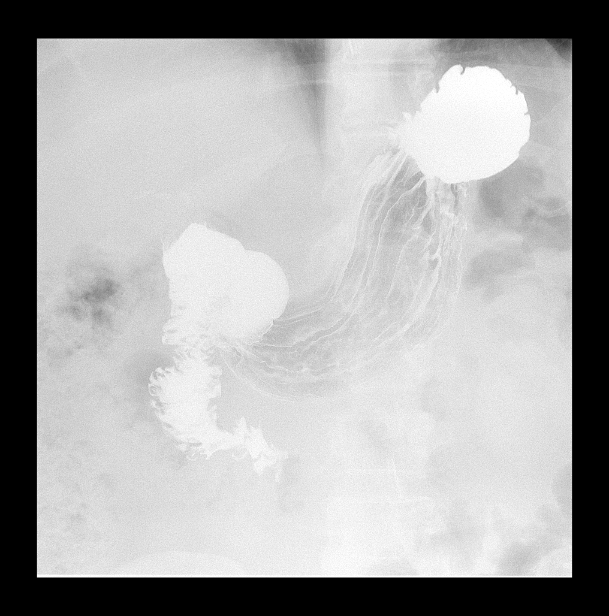

[Series 13: fluoro_barium 2fps_bw · 0.18mm/px · 1 of 1 slices shown (4 of 4)]
[im 1/1]
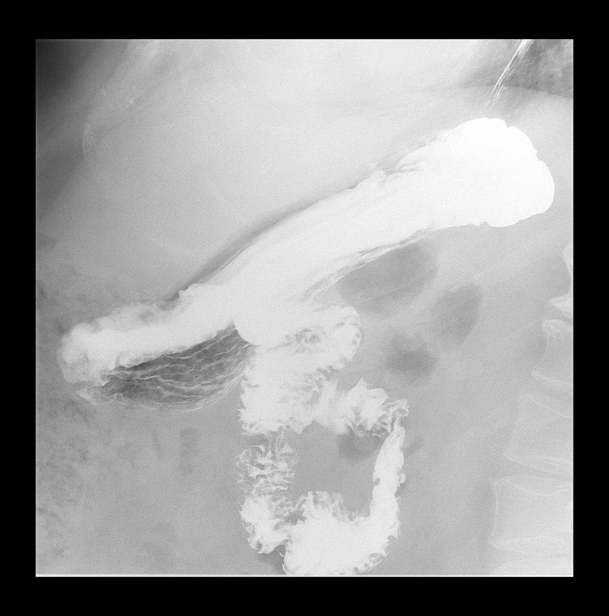

[Series 14: cp_standard · 0.28mm/px · 1 of 1 slices shown (6 of 6)]
[im 1/1]
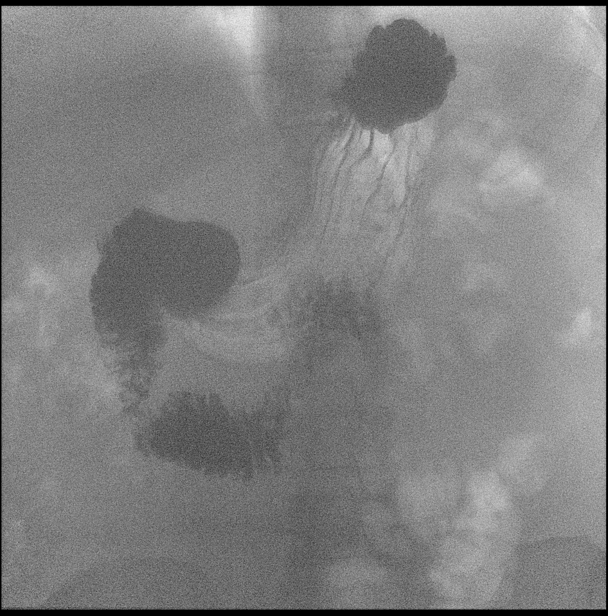

[14 of 16 positions shown; findings below may reference images not displayed]

FINDINGS: KUB:

There is a large amount of stool throughout the colon. There is no
bowel dilatation to suggest obstruction. There is no evidence of
pneumoperitoneum, portal venous gas or pneumatosis.

There are no pathologic calcifications along the expected course of
the ureters.

The osseous structures are unremarkable.

UPPER GI SERIES:

Examination of the esophagus demonstrated normal esophageal
motility. Normal esophageal morphology without evidence of
esophagitis or ulceration. No esophageal stricture, diverticula, or
mass lesion. No evidence of hiatal hernia. Moderate gastroesophageal
reflux.

Examination of the stomach demonstrated normal rugal folds and areae
gastricae. The gastric mucosa appeared unremarkable without evidence
of ulceration, scarring, or mass lesion. Gastric motility and
emptying was normal. Fluoroscopic examination of the duodenum
demonstrates normal motility and morphology without evidence of
ulceration or mass lesion.
IMPRESSION: 1. Moderate gastroesophageal reflux. Otherwise unremarkable upper GI
series.

## 2021-04-07 IMAGING — US US SOFT TISSUE EXCLUDE HEAD/NECK
1 series · 14 of 15 positions shown · non-contrast
Comparison: None.

CLINICAL DATA: Initial evaluation for abscess.

EXAM:
ULTRASOUND OF HEAD/NECK SOFT TISSUES
TECHNIQUE: Ultrasound examination of the head and neck soft tissues was
performed in the area of clinical concern.

[Series 1: us soft tissue exclude head/neck · 0.07mm/px · 15 acquisitions, 14 frames shown]
[im 1/15]
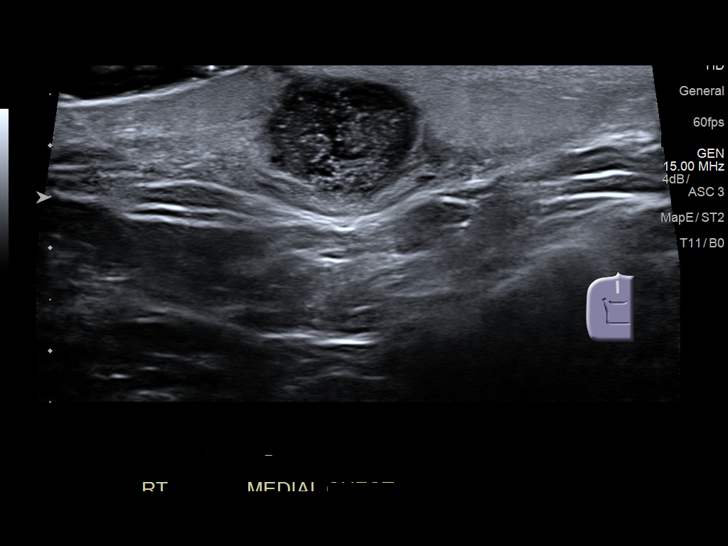
[im 2/15]
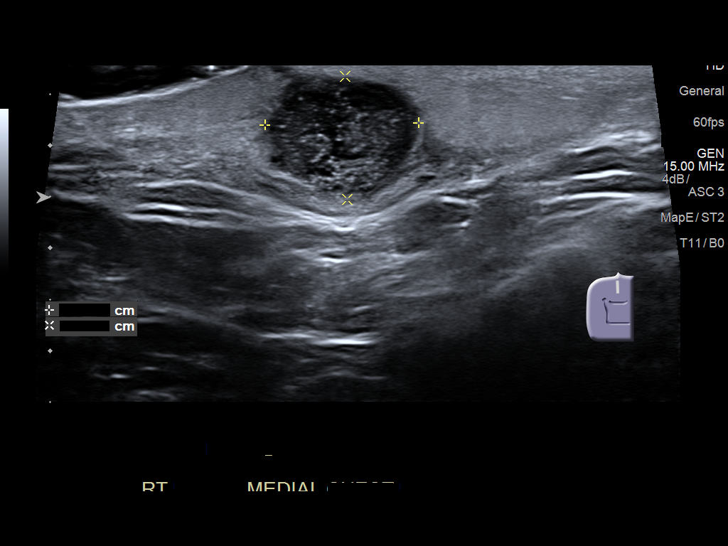
[im 3/15]
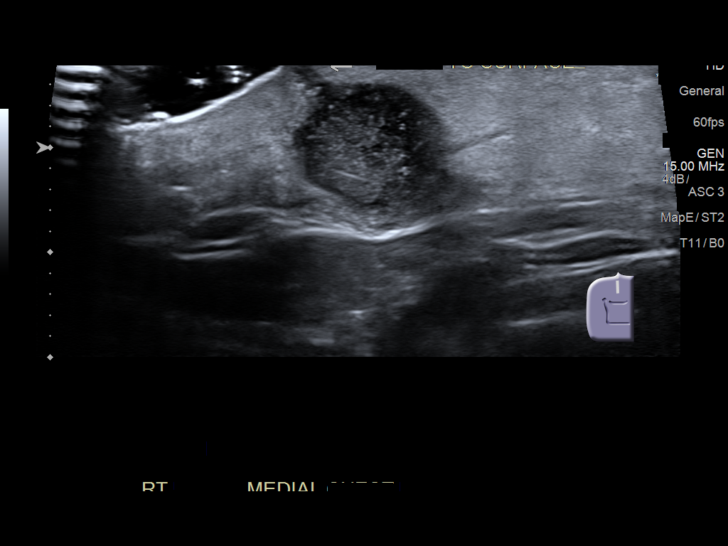
[im 4/15]
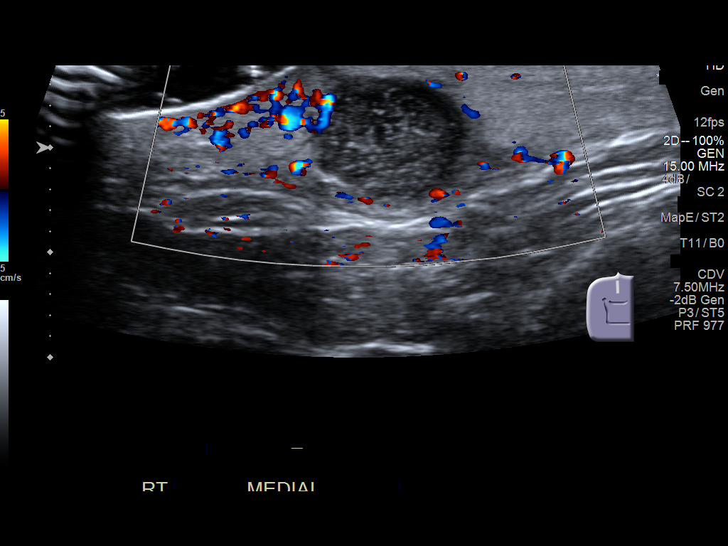
[im 5/15]
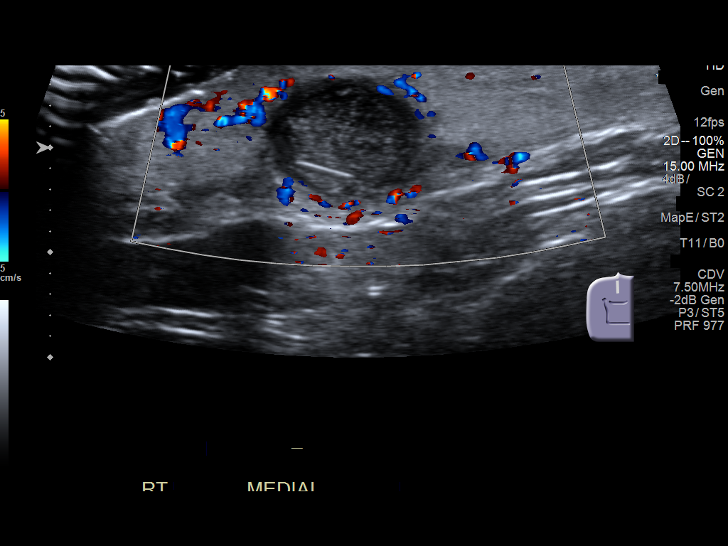
[im 6/15]
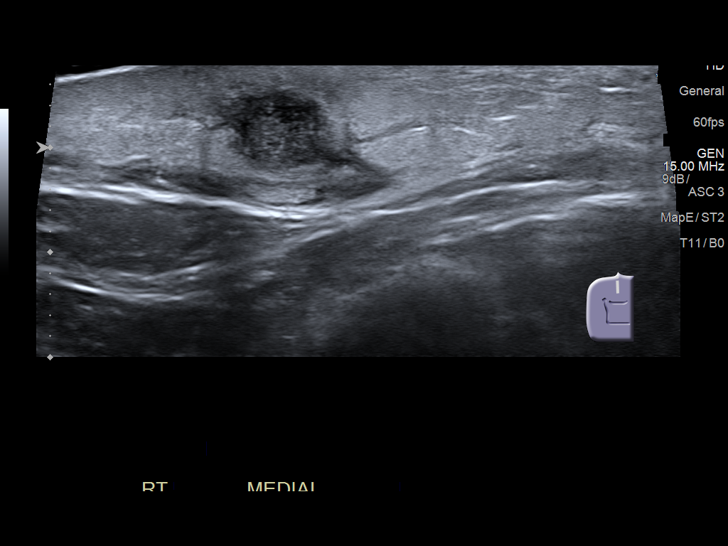
[im 7/15]
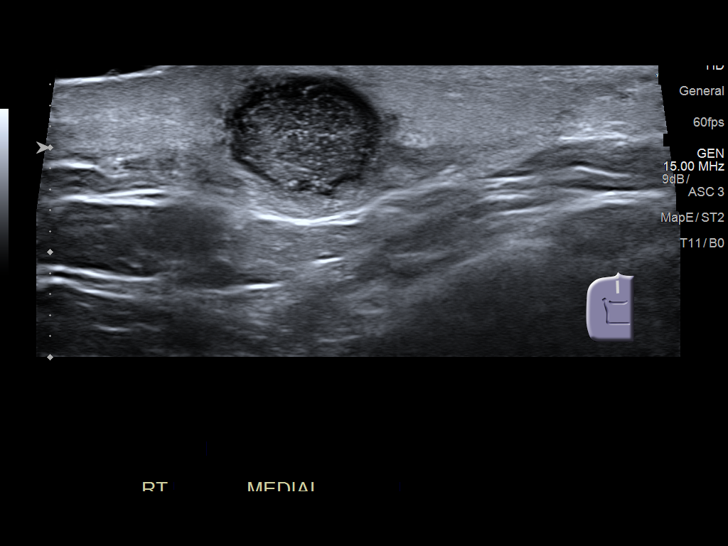
[im 9/15]
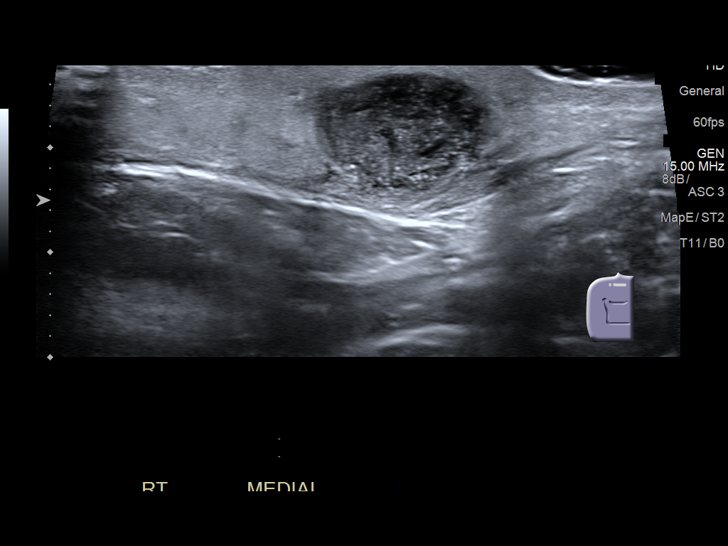
[im 10/15]
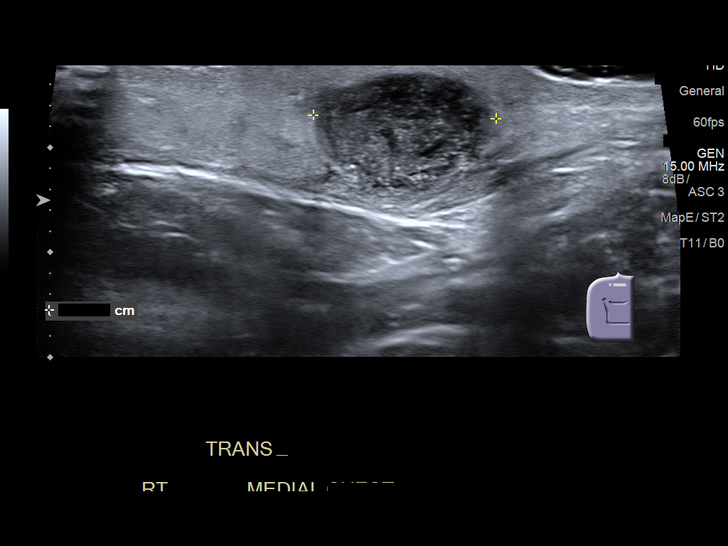
[im 11/15]
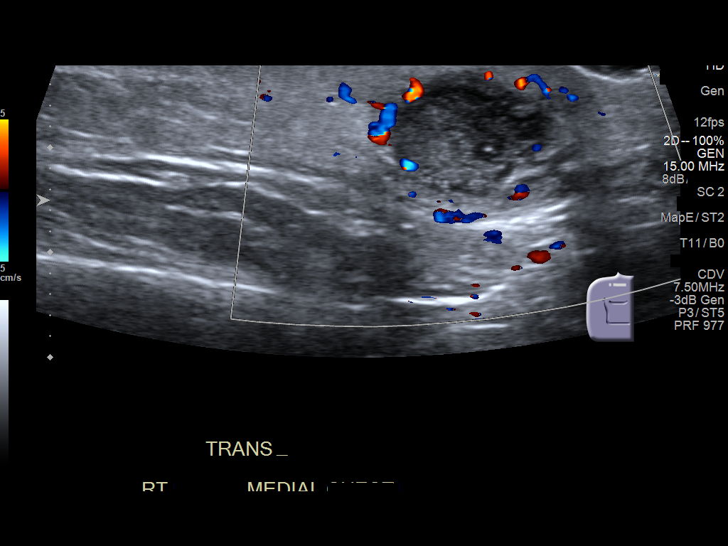
[im 12/15]
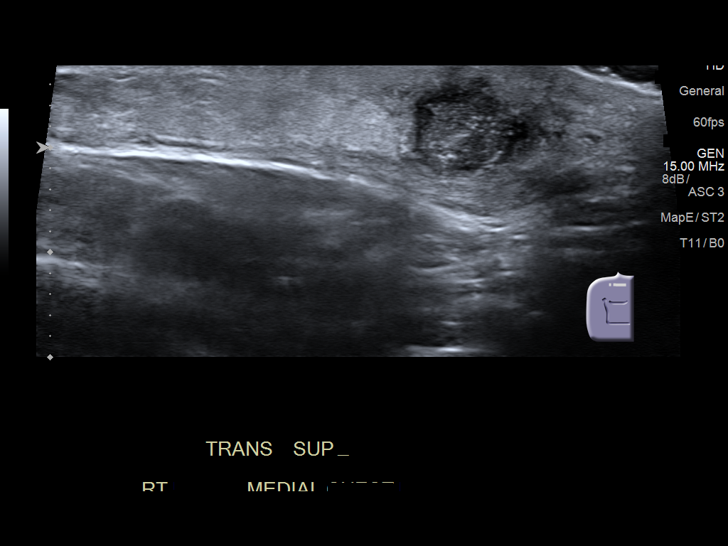
[im 13/15]
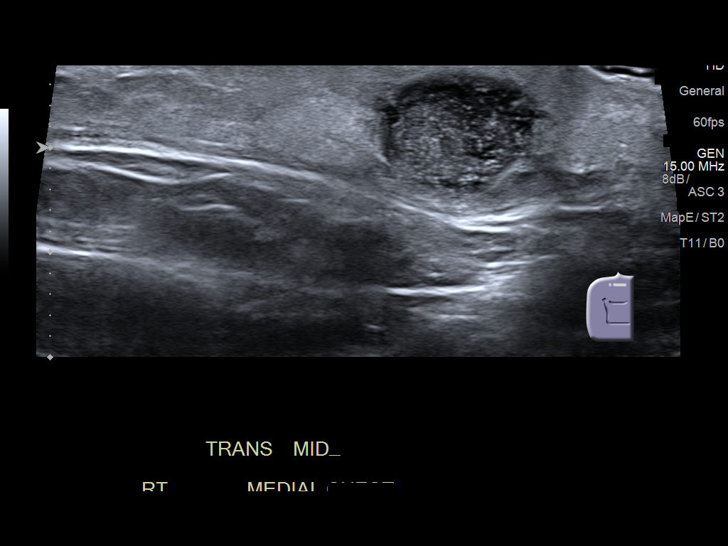
[im 14/15]
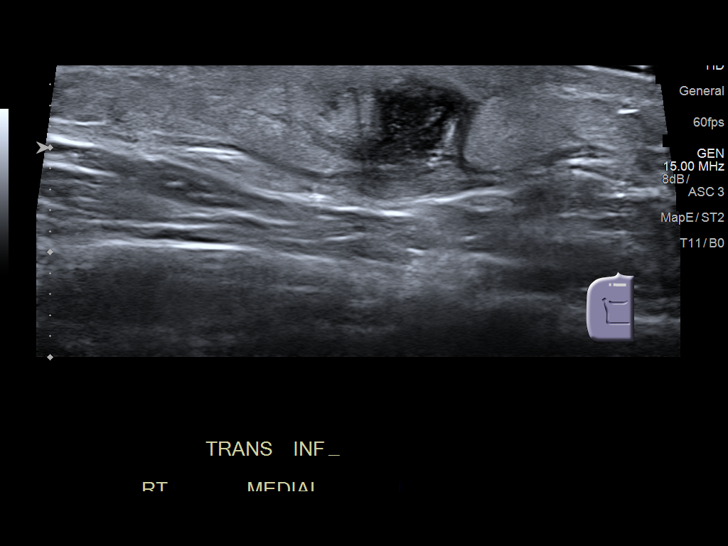
[im 15/15]
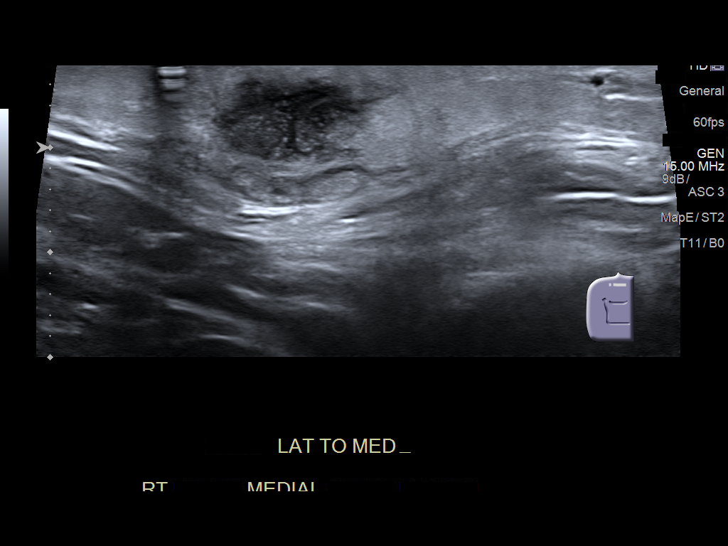

[14 of 15 positions shown; findings below may reference images not displayed]

FINDINGS: Targeted ultrasound of a palpable lump at the right upper chest
demonstrates a well-circumscribed round complex collection measuring
1.5 x 1.2 x 1.7 cm. Multiple scattered internal echoes with thin
linear echogenic structure seen within this collection. Mildly
increased surrounding vascularity. Draining sinus tract extends
towards the skin. Collection is positioned 5 mm deep to the skin.
IMPRESSION: 1.5 x 1.2 x 1.7 cm complex collection positioned just deep to the
skin at the palpable abnormality of concern at the right chest.
Primary considerations include a sebaceous cyst versus abscess.

## 2021-04-10 ENCOUNTER — Encounter (HOSPITAL_COMMUNITY): Payer: Self-pay | Admitting: *Deleted

## 2021-12-09 DEATH — deceased

## 2022-04-16 ENCOUNTER — Encounter (HOSPITAL_COMMUNITY): Payer: Self-pay | Admitting: *Deleted
